# Patient Record
Sex: Male | Born: 1965 | Race: Black or African American | Hispanic: No | Smoking: Current every day smoker
Health system: Southern US, Community
[De-identification: ages and names within clinical notes are randomized; demographics above are authoritative.]

## PROBLEM LIST (undated history)

## (undated) DIAGNOSIS — G8929 Other chronic pain: Secondary | ICD-10-CM

## (undated) DIAGNOSIS — M549 Dorsalgia, unspecified: Secondary | ICD-10-CM

## (undated) DIAGNOSIS — K469 Unspecified abdominal hernia without obstruction or gangrene: Secondary | ICD-10-CM

## (undated) HISTORY — PX: MANDIBLE FRACTURE SURGERY: SHX706

## (undated) HISTORY — PX: NEPHROSTOMY: SHX1014

## (undated) HISTORY — PX: BACK SURGERY: SHX140

## (undated) HISTORY — PX: HERNIA REPAIR: SHX51

---

## 2001-11-29 ENCOUNTER — Emergency Department (HOSPITAL_COMMUNITY): Admission: EM | Admit: 2001-11-29 | Discharge: 2001-11-29 | Payer: Self-pay | Admitting: Emergency Medicine

## 2001-11-29 ENCOUNTER — Encounter: Payer: Self-pay | Admitting: Emergency Medicine

## 2001-12-01 ENCOUNTER — Observation Stay (HOSPITAL_COMMUNITY): Admission: RE | Admit: 2001-12-01 | Discharge: 2001-12-02 | Payer: Self-pay | Admitting: Dentistry

## 2002-10-05 ENCOUNTER — Emergency Department (HOSPITAL_COMMUNITY): Admission: EM | Admit: 2002-10-05 | Discharge: 2002-10-05 | Payer: Self-pay | Admitting: Emergency Medicine

## 2005-04-13 ENCOUNTER — Emergency Department (HOSPITAL_COMMUNITY): Admission: EM | Admit: 2005-04-13 | Discharge: 2005-04-13 | Payer: Self-pay | Admitting: Emergency Medicine

## 2005-04-20 ENCOUNTER — Emergency Department (HOSPITAL_COMMUNITY): Admission: EM | Admit: 2005-04-20 | Discharge: 2005-04-20 | Payer: Self-pay | Admitting: *Deleted

## 2005-11-09 ENCOUNTER — Emergency Department (HOSPITAL_COMMUNITY): Admission: EM | Admit: 2005-11-09 | Discharge: 2005-11-10 | Payer: Self-pay | Admitting: Emergency Medicine

## 2005-11-18 ENCOUNTER — Emergency Department (HOSPITAL_COMMUNITY): Admission: EM | Admit: 2005-11-18 | Discharge: 2005-11-18 | Payer: Self-pay | Admitting: Emergency Medicine

## 2006-02-23 ENCOUNTER — Emergency Department (HOSPITAL_COMMUNITY): Admission: EM | Admit: 2006-02-23 | Discharge: 2006-02-23 | Payer: Self-pay | Admitting: Emergency Medicine

## 2006-03-14 ENCOUNTER — Emergency Department (HOSPITAL_COMMUNITY): Admission: EM | Admit: 2006-03-14 | Discharge: 2006-03-14 | Payer: Self-pay | Admitting: Emergency Medicine

## 2006-04-03 ENCOUNTER — Emergency Department (HOSPITAL_COMMUNITY): Admission: EM | Admit: 2006-04-03 | Discharge: 2006-04-04 | Payer: Self-pay | Admitting: Emergency Medicine

## 2006-11-20 ENCOUNTER — Emergency Department (HOSPITAL_COMMUNITY): Admission: EM | Admit: 2006-11-20 | Discharge: 2006-11-20 | Payer: Self-pay | Admitting: Emergency Medicine

## 2007-01-12 ENCOUNTER — Emergency Department (HOSPITAL_COMMUNITY): Admission: EM | Admit: 2007-01-12 | Discharge: 2007-01-12 | Payer: Self-pay | Admitting: Emergency Medicine

## 2008-02-02 ENCOUNTER — Emergency Department (HOSPITAL_COMMUNITY): Admission: EM | Admit: 2008-02-02 | Discharge: 2008-02-02 | Payer: Self-pay | Admitting: Emergency Medicine

## 2008-06-19 ENCOUNTER — Emergency Department (HOSPITAL_COMMUNITY): Admission: EM | Admit: 2008-06-19 | Discharge: 2008-06-19 | Payer: Self-pay | Admitting: Emergency Medicine

## 2008-07-02 ENCOUNTER — Emergency Department (HOSPITAL_COMMUNITY): Admission: EM | Admit: 2008-07-02 | Discharge: 2008-07-02 | Payer: Self-pay | Admitting: Emergency Medicine

## 2008-09-22 ENCOUNTER — Emergency Department (HOSPITAL_COMMUNITY): Admission: EM | Admit: 2008-09-22 | Discharge: 2008-09-22 | Payer: Self-pay | Admitting: Emergency Medicine

## 2008-11-22 ENCOUNTER — Emergency Department (HOSPITAL_COMMUNITY): Admission: EM | Admit: 2008-11-22 | Discharge: 2008-11-23 | Payer: Self-pay | Admitting: Emergency Medicine

## 2009-01-31 ENCOUNTER — Emergency Department (HOSPITAL_COMMUNITY): Admission: EM | Admit: 2009-01-31 | Discharge: 2009-01-31 | Payer: Self-pay | Admitting: Emergency Medicine

## 2010-10-13 LAB — CBC
HCT: 43.3 % (ref 39.0–52.0)
MCV: 98.9 fL (ref 78.0–100.0)
RDW: 13.2 % (ref 11.5–15.5)
WBC: 5.1 10*3/uL (ref 4.0–10.5)

## 2010-10-13 LAB — DIFFERENTIAL
Basophils Absolute: 0.1 10*3/uL (ref 0.0–0.1)
Basophils Relative: 1 % (ref 0–1)
Eosinophils Relative: 2 % (ref 0–5)
Lymphs Abs: 1.2 10*3/uL (ref 0.7–4.0)
Monocytes Absolute: 0.4 10*3/uL (ref 0.1–1.0)

## 2010-10-13 LAB — CK TOTAL AND CKMB (NOT AT ARMC)
Relative Index: 1.7 (ref 0.0–2.5)
Total CK: 374 U/L — ABNORMAL HIGH (ref 7–232)

## 2010-10-13 LAB — BASIC METABOLIC PANEL
Calcium: 9.8 mg/dL (ref 8.4–10.5)
Creatinine, Ser: 1.11 mg/dL (ref 0.4–1.5)
GFR calc Af Amer: >60 mL/min (ref >60)

## 2010-10-13 LAB — RAPID URINE DRUG SCREEN, HOSP PERFORMED
Amphetamines: NOT DETECTED
Cocaine: POSITIVE — AB
Opiates: NOT DETECTED
Tetrahydrocannabinol: POSITIVE — AB
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
## 2013-04-06 DIAGNOSIS — S02609A Fracture of mandible, unspecified, initial encounter for closed fracture: Secondary | ICD-10-CM | POA: Insufficient documentation

## 2013-07-04 ENCOUNTER — Encounter (HOSPITAL_COMMUNITY): Payer: Self-pay | Admitting: Emergency Medicine

## 2013-07-04 ENCOUNTER — Emergency Department (HOSPITAL_COMMUNITY): Payer: Medicaid Other

## 2013-07-04 ENCOUNTER — Emergency Department (HOSPITAL_COMMUNITY)
Admission: EM | Admit: 2013-07-04 | Discharge: 2013-07-04 | Disposition: A | Discharge status: Home / Self Care | Payer: Medicaid Other | Source: Home / Self Care | Attending: Emergency Medicine | Admitting: Emergency Medicine

## 2013-07-04 DIAGNOSIS — IMO0002 Reserved for concepts with insufficient information to code with codable children: Secondary | ICD-10-CM | POA: Insufficient documentation

## 2013-07-04 DIAGNOSIS — R209 Unspecified disturbances of skin sensation: Secondary | ICD-10-CM | POA: Insufficient documentation

## 2013-07-04 DIAGNOSIS — M549 Dorsalgia, unspecified: Secondary | ICD-10-CM

## 2013-07-04 DIAGNOSIS — F17200 Nicotine dependence, unspecified, uncomplicated: Secondary | ICD-10-CM | POA: Insufficient documentation

## 2013-07-04 MED ORDER — PREDNISONE 20 MG PO TABS: 60.0000 mg | ORAL_TABLET | Freq: Every day | ORAL | Status: AC

## 2013-07-04 MED ORDER — CYCLOBENZAPRINE HCL 10 MG PO TABS
10.0000 mg | ORAL_TABLET | Freq: Once | ORAL | Status: AC
  Administered 2013-07-04: 10 mg via ORAL
  Filled 2013-07-04: qty 1

## 2013-07-04 MED ORDER — DIAZEPAM 10 MG PO TABS: 5.0000 mg | ORAL_TABLET | Freq: Two times a day (BID) | ORAL | Status: AC

## 2013-07-04 MED ORDER — PREDNISONE 50 MG PO TABS
60.0000 mg | ORAL_TABLET | Freq: Once | ORAL | Status: AC
  Administered 2013-07-04: 60 mg via ORAL
  Filled 2013-07-04 (×2): qty 1

## 2013-07-04 MED ORDER — OXYCODONE-ACETAMINOPHEN 5-325 MG PO TABS
2.0000 | ORAL_TABLET | Freq: Once | ORAL | Status: AC
  Administered 2013-07-04: 2 via ORAL
  Filled 2013-07-04: qty 2

## 2013-07-04 MED ORDER — DIAZEPAM 5 MG PO TABS
10.0000 mg | ORAL_TABLET | Freq: Once | ORAL | Status: AC
  Administered 2013-07-04: 10 mg via ORAL
  Filled 2013-07-04: qty 2
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
## 2013-07-04 NOTE — ED Notes (Signed)
Unit secretary notified [facility] of pt's request to file a report.
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
## 2013-07-04 NOTE — ED Notes (Signed)
Pt requests food and drink. Pt informed nothing to eat/drink until seen by edp. Pt then requests wheelchair to go out and smoke. Pt informed this is a smoke free campus and if he goes out to smoke he will have to sign out AMA.
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
## 2013-07-04 NOTE — ED Provider Notes (Signed)
I discussed the patient's MRI, presentation with our neurosurgeon. On exam the patient is standing, though he has an antalgic gait.  He requests discharge. Patient's capacity to make this request.  I discussed the need for close monitoring, neurosurgery followup, medication compliance.  Patient voiced an understanding of this.  Gerhard Munch, MD 07/04/13 [phone]

## 2013-07-06 ENCOUNTER — Emergency Department (HOSPITAL_COMMUNITY): Payer: Medicaid Other

## 2013-07-06 ENCOUNTER — Inpatient Hospital Stay (HOSPITAL_COMMUNITY)
Admission: EM | Admit: 2013-07-06 | Discharge: 2013-07-12 | DRG: 029 | Disposition: A | Discharge status: Rehabilitation Facility | Payer: Medicaid Other | Attending: Neurosurgery | Admitting: Neurosurgery

## 2013-07-06 ENCOUNTER — Encounter (HOSPITAL_COMMUNITY)
Admission: EM | Disposition: A | Discharge status: Rehabilitation Facility | Payer: Self-pay | Source: Home / Self Care | Attending: Neurosurgery

## 2013-07-06 ENCOUNTER — Emergency Department (HOSPITAL_COMMUNITY): Payer: Medicaid Other | Admitting: Certified Registered Nurse Anesthetist

## 2013-07-06 ENCOUNTER — Encounter (HOSPITAL_COMMUNITY): Payer: Medicaid Other | Admitting: Certified Registered Nurse Anesthetist

## 2013-07-06 ENCOUNTER — Inpatient Hospital Stay (HOSPITAL_COMMUNITY): Payer: Medicaid Other

## 2013-07-06 DIAGNOSIS — G9520 Unspecified cord compression: Secondary | ICD-10-CM | POA: Diagnosis present

## 2013-07-06 DIAGNOSIS — S24101A Unspecified injury at T1 level of thoracic spinal cord, initial encounter: Principal | ICD-10-CM | POA: Diagnosis present

## 2013-07-06 DIAGNOSIS — M5124 Other intervertebral disc displacement, thoracic region: Secondary | ICD-10-CM | POA: Diagnosis present

## 2013-07-06 DIAGNOSIS — M4804 Spinal stenosis, thoracic region: Secondary | ICD-10-CM

## 2013-07-06 DIAGNOSIS — X500XXA Overexertion from strenuous movement or load, initial encounter: Secondary | ICD-10-CM | POA: Diagnosis present

## 2013-07-06 DIAGNOSIS — G8220 Paraplegia, unspecified: Secondary | ICD-10-CM

## 2013-07-06 DIAGNOSIS — F17200 Nicotine dependence, unspecified, uncomplicated: Secondary | ICD-10-CM | POA: Diagnosis present

## 2013-07-06 HISTORY — PX: LAMINECTOMY: SHX219

## 2013-07-06 HISTORY — DX: Unspecified abdominal hernia without obstruction or gangrene: K46.9

## 2013-07-06 HISTORY — PX: LUMBAR LAMINECTOMY/DECOMPRESSION MICRODISCECTOMY: SHX5026

## 2013-07-06 LAB — CBC WITH DIFFERENTIAL/PLATELET
Basophils Absolute: 0 10*3/uL (ref 0.0–0.1)
Basophils Relative: 0 % (ref 0–1)
Eosinophils Absolute: 0 10*3/uL (ref 0.0–0.7)
Hemoglobin: 13.1 g/dL (ref 13.0–17.0)
Lymphocytes Relative: 6 % — ABNORMAL LOW (ref 12–46)
Lymphs Abs: 0.6 10*3/uL — ABNORMAL LOW (ref 0.7–4.0)
MCH: 32.8 pg (ref 26.0–34.0)
MCHC: 33.5 g/dL (ref 30.0–36.0)
Monocytes Absolute: 0.1 10*3/uL (ref 0.1–1.0)
Monocytes Relative: 1 % — ABNORMAL LOW (ref 3–12)
Neutro Abs: 9.7 10*3/uL — ABNORMAL HIGH (ref 1.7–7.7)
Neutrophils Relative %: 93 % — ABNORMAL HIGH (ref 43–77)
RDW: 14.2 % (ref 11.5–15.5)
WBC: 10.4 10*3/uL (ref 4.0–10.5)

## 2013-07-06 LAB — POCT I-STAT, CHEM 8
BUN: 13 mg/dL (ref 6–23)
Calcium, Ion: 1.25 mmol/L — ABNORMAL HIGH (ref 1.12–1.23)
Creatinine, Ser: 0.8 mg/dL (ref 0.50–1.35)
Glucose, Bld: 91 mg/dL (ref 70–99)
HCT: 43 % (ref 39.0–52.0)
Hemoglobin: 14.6 g/dL (ref 13.0–17.0)
Potassium: 4 mEq/L (ref 3.7–5.3)
TCO2: 30 mmol/L (ref 0–100)

## 2013-07-06 LAB — URINALYSIS, ROUTINE W REFLEX MICROSCOPIC
Bilirubin Urine: NEGATIVE
Glucose, UA: NEGATIVE mg/dL
Hgb urine dipstick: NEGATIVE
Nitrite: NEGATIVE
Protein, ur: NEGATIVE mg/dL
Specific Gravity, Urine: 1.016 (ref 1.005–1.030)
Urobilinogen, UA: 0.2 mg/dL (ref 0.0–1.0)

## 2013-07-06 LAB — TYPE AND SCREEN: Antibody Screen: NEGATIVE

## 2013-07-06 LAB — PROTIME-INR: INR: 0.9 (ref 0.00–1.49)

## 2013-07-06 LAB — ABO/RH: ABO/RH(D): O POS

## 2013-07-06 SURGERY — LUMBAR LAMINECTOMY/DECOMPRESSION MICRODISCECTOMY 1 LEVEL
Anesthesia: General | Site: Back

## 2013-07-06 MED ORDER — 0.9 % SODIUM CHLORIDE (POUR BTL) OPTIME
TOPICAL | Status: DC | PRN
  Administered 2013-07-06: 1000 mL

## 2013-07-06 MED ORDER — CEFAZOLIN SODIUM 1-5 GM-% IV SOLN
INTRAVENOUS | Status: AC
  Administered 2013-07-06: 1 g via INTRAVENOUS
  Filled 2013-07-06: qty 50

## 2013-07-06 MED ORDER — SUCCINYLCHOLINE CHLORIDE 20 MG/ML IJ SOLN
INTRAMUSCULAR | Status: DC | PRN
  Administered 2013-07-06: 140 mg via INTRAVENOUS

## 2013-07-06 MED ORDER — LIDOCAINE HCL (CARDIAC) 20 MG/ML IV SOLN
INTRAVENOUS | Status: DC | PRN
  Administered 2013-07-06: 100 mg via INTRAVENOUS

## 2013-07-06 MED ORDER — MIDAZOLAM HCL 5 MG/5ML IJ SOLN
INTRAMUSCULAR | Status: DC | PRN
  Administered 2013-07-06: 2 mg via INTRAVENOUS

## 2013-07-06 MED ORDER — ROCURONIUM BROMIDE 100 MG/10ML IV SOLN
INTRAVENOUS | Status: DC | PRN
  Administered 2013-07-06: 50 mg via INTRAVENOUS
  Administered 2013-07-06: 10 mg via INTRAVENOUS

## 2013-07-06 MED ORDER — SODIUM CHLORIDE 0.9 % IR SOLN
Status: DC | PRN
  Administered 2013-07-06: 22:00:00

## 2013-07-06 MED ORDER — ONDANSETRON HCL 4 MG/2ML IJ SOLN
4.0000 mg | Freq: Once | INTRAMUSCULAR | Status: AC
  Administered 2013-07-06: 4 mg via INTRAVENOUS
  Filled 2013-07-06: qty 2

## 2013-07-06 MED ORDER — ARTIFICIAL TEARS OP OINT
TOPICAL_OINTMENT | OPHTHALMIC | Status: DC | PRN
  Administered 2013-07-06: 1 via OPHTHALMIC

## 2013-07-06 MED ORDER — FENTANYL CITRATE 0.05 MG/ML IJ SOLN
INTRAMUSCULAR | Status: DC | PRN
  Administered 2013-07-06 (×2): 50 ug via INTRAVENOUS
  Administered 2013-07-06: 150 ug via INTRAVENOUS

## 2013-07-06 MED ORDER — HYDROMORPHONE HCL PF 1 MG/ML IJ SOLN
1.0000 mg | Freq: Once | INTRAMUSCULAR | Status: AC
  Administered 2013-07-06: 1 mg via INTRAVENOUS
  Filled 2013-07-06: qty 1

## 2013-07-06 MED ORDER — PROPOFOL 10 MG/ML IV BOLUS
INTRAVENOUS | Status: DC | PRN
  Administered 2013-07-06: 200 mg via INTRAVENOUS

## 2013-07-06 MED ORDER — LACTATED RINGERS IV SOLN
INTRAVENOUS | Status: DC | PRN
  Administered 2013-07-06 (×2): via INTRAVENOUS

## 2013-07-06 MED ORDER — BUPIVACAINE HCL (PF) 0.5 % IJ SOLN
INTRAMUSCULAR | Status: DC | PRN
  Administered 2013-07-06: 3 mL

## 2013-07-06 MED ORDER — THROMBIN 20000 UNITS EX KIT
PACK | CUTANEOUS | Status: DC | PRN
  Administered 2013-07-06: 02:00:00 via TOPICAL

## 2013-07-06 MED ORDER — LORAZEPAM 2 MG/ML IJ SOLN
1.0000 mg | Freq: Once | INTRAMUSCULAR | Status: AC
  Administered 2013-07-06: 1 mg via INTRAVENOUS
  Filled 2013-07-06: qty 1

## 2013-07-06 MED ORDER — GELATIN ABSORBABLE MT POWD
OROMUCOSAL | Status: DC | PRN
  Administered 2013-07-06: 23:00:00 via TOPICAL

## 2013-07-06 MED ORDER — LIDOCAINE-EPINEPHRINE 1 %-1:100000 IJ SOLN
INTRAMUSCULAR | Status: DC | PRN
  Administered 2013-07-06: 3 mL via INTRADERMAL

## 2013-07-06 MED ORDER — NICOTINE 21 MG/24HR TD PT24
21.0000 mg | MEDICATED_PATCH | Freq: Once | TRANSDERMAL | Status: AC
  Administered 2013-07-06: 21 mg via TRANSDERMAL
  Filled 2013-07-06: qty 1

## 2013-07-06 SURGICAL SUPPLY — 63 items
ADH SKN CLS APL DERMABOND .7 (GAUZE/BANDAGES/DRESSINGS) ×1
APL SKNCLS STERI-STRIP NONHPOA (GAUZE/BANDAGES/DRESSINGS)
BAG DECANTER FOR FLEXI CONT (MISCELLANEOUS) ×2 IMPLANT
BENZOIN TINCTURE PRP APPL 2/3 (GAUZE/BANDAGES/DRESSINGS) IMPLANT
BLADE SURG 11 STRL SS (BLADE) ×2 IMPLANT
BLADE SURG ROTATE 9660 (MISCELLANEOUS) IMPLANT
BUR MATCHSTICK NEURO 3.0 LAGG (BURR) ×2 IMPLANT
BUR ROUND FLUTED 5 RND (BURR) ×2 IMPLANT
CANISTER SUCT 3000ML (MISCELLANEOUS) ×2 IMPLANT
CONT SPEC 4OZ CLIKSEAL STRL BL (MISCELLANEOUS) ×2 IMPLANT
DECANTER SPIKE VIAL GLASS SM (MISCELLANEOUS) ×2 IMPLANT
DERMABOND ADVANCED (GAUZE/BANDAGES/DRESSINGS) ×1
DERMABOND ADVANCED .7 DNX12 (GAUZE/BANDAGES/DRESSINGS) ×1 IMPLANT
DRAPE LAPAROTOMY 100X72X124 (DRAPES) ×2 IMPLANT
DRAPE MICROSCOPE LEICA (MISCELLANEOUS) ×2 IMPLANT
DRAPE POUCH INSTRU U-SHP 10X18 (DRAPES) ×2 IMPLANT
DRAPE SURG 17X23 STRL (DRAPES) ×2 IMPLANT
DRSG OPSITE POSTOP 4X6 (GAUZE/BANDAGES/DRESSINGS) ×2 IMPLANT
DURAPREP 26ML APPLICATOR (WOUND CARE) ×2 IMPLANT
ELECT REM PT RETURN 9FT ADLT (ELECTROSURGICAL) ×2
ELECTRODE REM PT RTRN 9FT ADLT (ELECTROSURGICAL) ×1 IMPLANT
GAUZE SPONGE 4X4 16PLY XRAY LF (GAUZE/BANDAGES/DRESSINGS) IMPLANT
GLOVE BIO SURGEON STRL SZ 6.5 (GLOVE) ×2 IMPLANT
GLOVE BIO SURGEON STRL SZ7 (GLOVE) ×1 IMPLANT
GLOVE BIOGEL PI IND STRL 7.5 (GLOVE) ×1 IMPLANT
GLOVE BIOGEL PI INDICATOR 7.5 (GLOVE) ×1
GLOVE ECLIPSE 7.0 STRL STRAW (GLOVE) ×2 IMPLANT
GLOVE EXAM NITRILE LRG STRL (GLOVE) IMPLANT
GLOVE EXAM NITRILE MD LF STRL (GLOVE) IMPLANT
GLOVE EXAM NITRILE XL STR (GLOVE) IMPLANT
GLOVE EXAM NITRILE XS STR PU (GLOVE) IMPLANT
GOWN BRE IMP SLV AUR LG STRL (GOWN DISPOSABLE) ×4 IMPLANT
GOWN BRE IMP SLV AUR XL STRL (GOWN DISPOSABLE) IMPLANT
GOWN STRL REIN 2XL LVL4 (GOWN DISPOSABLE) IMPLANT
HEMOSTAT POWDER KIT SURGIFOAM (HEMOSTASIS) ×2 IMPLANT
KIT BASIN OR (CUSTOM PROCEDURE TRAY) ×2 IMPLANT
KIT ROOM TURNOVER OR (KITS) ×2 IMPLANT
NDL HYPO 18GX1.5 BLUNT FILL (NEEDLE) IMPLANT
NDL HYPO 25X1 1.5 SAFETY (NEEDLE) ×1 IMPLANT
NDL SPNL 18GX3.5 QUINCKE PK (NEEDLE) IMPLANT
NEEDLE HYPO 18GX1.5 BLUNT FILL (NEEDLE) IMPLANT
NEEDLE HYPO 25X1 1.5 SAFETY (NEEDLE) ×2 IMPLANT
NEEDLE SPNL 18GX3.5 QUINCKE PK (NEEDLE) IMPLANT
NEEDLE SPNL 22GX3.5 QUINCKE BK (NEEDLE) ×2 IMPLANT
NS IRRIG 1000ML POUR BTL (IV SOLUTION) ×2 IMPLANT
PACK LAMINECTOMY NEURO (CUSTOM PROCEDURE TRAY) ×2 IMPLANT
PAD ARMBOARD 7.5X6 YLW CONV (MISCELLANEOUS) ×6 IMPLANT
RUBBERBAND STERILE (MISCELLANEOUS) ×4 IMPLANT
SPONGE GAUZE 4X4 12PLY (GAUZE/BANDAGES/DRESSINGS) IMPLANT
SPONGE LAP 4X18 X RAY DECT (DISPOSABLE) IMPLANT
SPONGE SURGIFOAM ABS GEL SZ50 (HEMOSTASIS) ×2 IMPLANT
STRIP CLOSURE SKIN 1/2X4 (GAUZE/BANDAGES/DRESSINGS) IMPLANT
SUT VIC AB 0 CT1 18XCR BRD8 (SUTURE) ×1 IMPLANT
SUT VIC AB 0 CT1 8-18 (SUTURE) ×2
SUT VIC AB 2-0 CT1 18 (SUTURE) IMPLANT
SUT VIC AB 3-0 FS2 27 (SUTURE) IMPLANT
SUT VIC AB 3-0 SH 8-18 (SUTURE) ×2 IMPLANT
SUT VICRYL 3-0 RB1 18 ABS (SUTURE) IMPLANT
SYR 20ML ECCENTRIC (SYRINGE) ×2 IMPLANT
SYR 3ML LL SCALE MARK (SYRINGE) IMPLANT
TOWEL OR 17X24 6PK STRL BLUE (TOWEL DISPOSABLE) ×2 IMPLANT
TOWEL OR 17X26 10 PK STRL BLUE (TOWEL DISPOSABLE) ×2 IMPLANT
WATER STERILE IRR 1000ML POUR (IV SOLUTION) ×2 IMPLANT

## 2013-07-06 NOTE — ED Notes (Signed)
Per MRI request pt is not staying still & it is difficult to get an accurate image, [place], PA made aware, RN to MRI to admin ativan 1 mg & dilaudid 1 mg, MRI unable to complete scan at this time, PA made aware, pt returned to room for MRI to be completed in the future

## 2013-07-06 NOTE — ED Notes (Signed)
Pt is asking for something to eat. Pt informed that he cannot eat until the results of his MRI come back.

## 2013-07-06 NOTE — ED Notes (Signed)
Neurosurgery, MD at bedside.
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
## 2013-07-06 NOTE — ED Notes (Signed)
Pt returned to department from MRI
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
## 2013-07-06 NOTE — ED Notes (Signed)
Pt in from home via [facility], per EMS report pt fell down stairs x 7 days ago, denies LOC & head injury at that time, pt reports increasing difficulty walking since that time, denies bowel & bladder incontinence, pt A&O x4, follows commands, speaks in complete sentences, pt non ambulatory upon EMS arrival,  pt seen at [facility] for similar symptoms last Friday

## 2013-07-06 NOTE — ED Notes (Signed)
Patient transported to MRI 

## 2013-07-06 NOTE — ED Notes (Signed)
OR is ready for the pt to go to the holding room. This RN informed them that the pt needs blood drawn and a CT scan before the pt can be taken to OR holding. This RN will call when the pt is on the way upstairs.

## 2013-07-06 NOTE — H&P (Signed)
CC:  Chief Complaint  Patient presents with  . Back Pain    HPI: Eddie Rocha is a 47 y.o. male who presents to the emergency department today complaining of back pain and weakness in the legs with inability to walk. He said the pain and weakness initially started on Friday approximately 5 days ago after he was wrestling with some family. Since that time, he states he has sharp pain in the middle of his back in the thoracolumbar region. In addition, he describes "numbness" which extends down on both sides below the level of his navel. The patient denies any changes in bowel or bladder habits, in particular denying symptoms of urinary retention.  The patient was apparently seen in the emergency department approximately 2 days ago with similar complaints of back pain and the weakness, however per emergency department notes, the patient was ambulatory at that time.  PMH: No past medical history on file.  PSH: Past Surgical History  Procedure Laterality Date  . Mandible fracture surgery      SH: History  Substance Use Topics  . Smoking status: Current Every Day Smoker    Types: Cigarettes  . Smokeless tobacco: Not on file  . Alcohol Use: Yes    MEDS: Prior to Admission medications   Medication Sig Start Date End Date Taking? Authorizing Provider  diazepam (VALIUM) 10 MG tablet Take 0.5 tablets (5 mg total) by mouth 2 (two) times daily. 07/04/13 07/09/13 Yes Gerhard Munch, MD  predniSONE (DELTASONE) 20 MG tablet Take 3 tablets (60 mg total) by mouth daily with breakfast. 07/05/13 07/09/13 Yes Gerhard Munch, MD    ALLERGY: No Known Allergies  NEUROLOGIC EXAM: Awake, alert, oriented Memory and concentration grossly intact Speech fluent, appropriate CN grossly intact Motor exam: Upper Extremities Deltoid Bicep Tricep Grip  Right 5/5 5/5 5/5 5/5  Left 5/5 5/5 5/5 5/5   Lower Extremity IP Quad PF DF EHL  Right 3/5 4-/5 0/5 0/5 1/5  Left 2/5 4-/5 0/5 0/5 0/5    Decreased sensation to LT below ~T10 level bilaterally (+) babinski on right, downgoing on left  IMGAING: MRI Tspine reviewed demonstrating compressive epidural lesion at T9-10 likely EDH with underlying thoracic disc herniation. There may be slight T2 signal change in the thoracic cord at this level.  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 PLAN: - CT T-spine to evaluate for bony fracture - Surgery for laminectomy and evacuation of EDH/decompression of spinal cord  The MRI findings were discussed with the patient. I explained to him the need for surgery for decompression of the spinal cord to give him the best chance for functional recovery of strength in the lower extremities. The risks of the surgery were explained to the patient including bleeding, infection, and worsening spinal cord injury. The patient understood our discussion and provided verbal and written consent for surgery which was witnessed by the emergency department nurse in the room. All his questions were answered.

## 2013-07-06 NOTE — Anesthesia Procedure Notes (Signed)
Procedure Name: Intubation Date/Time: 07/06/2013 9:51 PM Performed by: Angelica Pou Pre-anesthesia Checklist: Patient identified, Patient being monitored, Emergency Drugs available, Timeout performed and Suction available Patient Re-evaluated:Patient Re-evaluated prior to inductionOxygen Delivery Method: Circle system utilized Preoxygenation: Pre-oxygenation with 100% oxygen Intubation Type: IV induction, Rapid sequence and Cricoid Pressure applied Laryngoscope Size: Mac, 3 and 4 (Glidescope #4) Grade View: Grade IV Tube size: 7.5 mm Number of attempts: 4 Airway Equipment and Method: Stylet,  Rigid stylet and Video-laryngoscopy Placement Confirmation: ETT inserted through vocal cords under direct vision,  breath sounds checked- equal and bilateral and positive ETCO2 Secured at: 23 cm Tube secured with: Tape Dental Injury: Bloody posterior oropharynx  Difficulty Due To: Difficult Airway- due to limited oral opening and Difficult Airway- due to anterior larynx Comments: Pt with hx broken jaw, lower hardware remains in place. Smooth IV induction by Dr Michelle Piper, DL x1 with Mac 3 by CRNA with Gr IV view. DL x1 with Mac 3 by MDA with Gr IV view. VL x2 with GS #4 by MDA with Gr IV view.  ETT passed by MDA, resulting in +etCO2, BS=B.  Pt with VSS throughout, SpO2 100%.

## 2013-07-06 NOTE — ED Notes (Signed)
Pt taken to CT by this RN, and then transported to the neurosurgery OR holding area. Report given to the nurse anesthetist at that time.

## 2013-07-06 NOTE — ED Provider Notes (Signed)
Medical screening examination/treatment/procedure(s) were conducted as a shared visit with non-physician practitioner(s) and myself.  I personally evaluated the patient during the encounter.  EKG Interpretation   None        Doug Sou, MD 07/06/13 2025

## 2013-07-06 NOTE — Anesthesia Preprocedure Evaluation (Addendum)
Anesthesia Evaluation  Patient identified by MRN, date of birth, ID band Patient awake    Reviewed: Allergy & Precautions, H&P , NPO status , Patient's Chart, lab work & pertinent test results  History of Anesthesia Complications Negative for: history of anesthetic complications  Airway Mallampati: I TM Distance: >3 FB Neck ROM: Full  Mouth opening: Limited Mouth Opening  Dental  (+) Dental Advisory Given, Chipped and Poor Dentition   Pulmonary Current Smoker,  breath sounds clear to auscultation  Pulmonary exam normal       Cardiovascular Exercise Tolerance: Good Rhythm:Regular Rate:Normal     Neuro/Psych    GI/Hepatic   Endo/Other    Renal/GU      Musculoskeletal   Abdominal   Peds  Hematology   Anesthesia Other Findings   Reproductive/Obstetrics                          Anesthesia Physical Anesthesia Plan  ASA: II  Anesthesia Plan: General   Post-op Pain Management:    Induction: Intravenous, Rapid sequence and Cricoid pressure planned  Airway Management Planned: Oral ETT  Additional Equipment:   Intra-op Plan:   Post-operative Plan: Extubation in OR  Informed Consent: I have reviewed the patients History and Physical, chart, labs and discussed the procedure including the risks, benefits and alternatives for the proposed anesthesia with the patient or authorized representative who has indicated his/her understanding and acceptance.     Plan Discussed with: CRNA and Surgeon  Anesthesia Plan Comments:         Anesthesia Quick Evaluation

## 2013-07-07 ENCOUNTER — Inpatient Hospital Stay (HOSPITAL_COMMUNITY): Payer: Medicaid Other

## 2013-07-07 ENCOUNTER — Encounter (HOSPITAL_COMMUNITY): Payer: Self-pay | Admitting: General Practice

## 2013-07-07 DIAGNOSIS — G9520 Unspecified cord compression: Secondary | ICD-10-CM | POA: Diagnosis present

## 2013-07-07 MED ORDER — SENNA 8.6 MG PO TABS
1.0000 | ORAL_TABLET | Freq: Two times a day (BID) | ORAL | Status: DC
  Administered 2013-07-07 – 2013-07-11 (×9): 8.6 mg via ORAL
  Filled 2013-07-07 (×13): qty 1

## 2013-07-07 MED ORDER — SODIUM CHLORIDE 0.9 % IV SOLN: 250.0000 mL | INTRAVENOUS | Status: DC

## 2013-07-07 MED ORDER — HYDROMORPHONE HCL PF 1 MG/ML IJ SOLN: 0.2500 mg | INTRAMUSCULAR | Status: DC | PRN

## 2013-07-07 MED ORDER — OXYCODONE-ACETAMINOPHEN 5-325 MG PO TABS
1.0000 | ORAL_TABLET | ORAL | Status: DC | PRN
  Administered 2013-07-07 – 2013-07-12 (×16): 2 via ORAL
  Filled 2013-07-07 (×17): qty 2

## 2013-07-07 MED ORDER — SODIUM CHLORIDE 0.9 % IV SOLN: INTRAVENOUS | Status: DC

## 2013-07-07 MED ORDER — NICOTINE 21 MG/24HR TD PT24
21.0000 mg | MEDICATED_PATCH | Freq: Every day | TRANSDERMAL | Status: DC
  Administered 2013-07-07 – 2013-07-12 (×5): 21 mg via TRANSDERMAL
  Filled 2013-07-07 (×7): qty 1

## 2013-07-07 MED ORDER — DOCUSATE SODIUM 100 MG PO CAPS
100.0000 mg | ORAL_CAPSULE | Freq: Two times a day (BID) | ORAL | Status: DC
  Administered 2013-07-07 – 2013-07-11 (×10): 100 mg via ORAL
  Filled 2013-07-07 (×12): qty 1

## 2013-07-07 MED ORDER — SODIUM CHLORIDE 0.9 % IJ SOLN
3.0000 mL | INTRAMUSCULAR | Status: DC | PRN
  Administered 2013-07-11: 3 mL via INTRAVENOUS

## 2013-07-07 MED ORDER — OXYCODONE HCL 5 MG PO TABS: 5.0000 mg | ORAL_TABLET | Freq: Once | ORAL | Status: DC | PRN

## 2013-07-07 MED ORDER — ONDANSETRON HCL 4 MG/2ML IJ SOLN: 4.0000 mg | INTRAMUSCULAR | Status: DC | PRN

## 2013-07-07 MED ORDER — PHENOL 1.4 % MT LIQD: 1.0000 | OROMUCOSAL | Status: DC | PRN

## 2013-07-07 MED ORDER — ONDANSETRON HCL 4 MG/2ML IJ SOLN: 4.0000 mg | Freq: Once | INTRAMUSCULAR | Status: DC | PRN

## 2013-07-07 MED ORDER — ACETAMINOPHEN 325 MG PO TABS: 650.0000 mg | ORAL_TABLET | ORAL | Status: DC | PRN

## 2013-07-07 MED ORDER — DIAZEPAM 5 MG PO TABS
5.0000 mg | ORAL_TABLET | Freq: Two times a day (BID) | ORAL | Status: DC
Start: 2013-07-07 — End: 2013-07-12
  Administered 2013-07-07 – 2013-07-12 (×12): 5 mg via ORAL
  Filled 2013-07-07 (×12): qty 1

## 2013-07-07 MED ORDER — MENTHOL 3 MG MT LOZG: 1.0000 | LOZENGE | OROMUCOSAL | Status: DC | PRN

## 2013-07-07 MED ORDER — SODIUM CHLORIDE 0.9 % IJ SOLN
3.0000 mL | Freq: Two times a day (BID) | INTRAMUSCULAR | Status: DC
  Administered 2013-07-07 – 2013-07-12 (×11): 3 mL via INTRAVENOUS

## 2013-07-07 MED ORDER — GLYCOPYRROLATE 0.2 MG/ML IJ SOLN
INTRAMUSCULAR | Status: DC | PRN
  Administered 2013-07-07: .6 mg via INTRAVENOUS

## 2013-07-07 MED ORDER — HEPARIN SODIUM (PORCINE) 5000 UNIT/ML IJ SOLN
5000.0000 [IU] | Freq: Three times a day (TID) | INTRAMUSCULAR | Status: DC
  Administered 2013-07-08 – 2013-07-11 (×7): 5000 [IU] via SUBCUTANEOUS
  Filled 2013-07-07 (×16): qty 1

## 2013-07-07 MED ORDER — MEPERIDINE HCL 25 MG/ML IJ SOLN: 6.2500 mg | INTRAMUSCULAR | Status: DC | PRN

## 2013-07-07 MED ORDER — CEFAZOLIN SODIUM 1-5 GM-% IV SOLN
1.0000 g | Freq: Three times a day (TID) | INTRAVENOUS | Status: AC
  Administered 2013-07-07 (×2): 1 g via INTRAVENOUS
  Filled 2013-07-07 (×3): qty 50

## 2013-07-07 MED ORDER — OXYCODONE HCL 5 MG/5ML PO SOLN: 5.0000 mg | Freq: Once | ORAL | Status: DC | PRN

## 2013-07-07 MED ORDER — BIOTENE DRY MOUTH MT LIQD
15.0000 mL | Freq: Two times a day (BID) | OROMUCOSAL | Status: DC
  Administered 2013-07-07 – 2013-07-12 (×9): 15 mL via OROMUCOSAL

## 2013-07-07 MED ORDER — ACETAMINOPHEN 650 MG RE SUPP: 650.0000 mg | RECTAL | Status: DC | PRN

## 2013-07-07 MED ORDER — NEOSTIGMINE METHYLSULFATE 1 MG/ML IJ SOLN
INTRAMUSCULAR | Status: DC | PRN
  Administered 2013-07-07: 4 mg via INTRAVENOUS

## 2013-07-07 MED ORDER — MORPHINE SULFATE 2 MG/ML IJ SOLN
1.0000 mg | INTRAMUSCULAR | Status: DC | PRN
  Administered 2013-07-07 – 2013-07-08 (×7): 2 mg via INTRAVENOUS
  Administered 2013-07-08: 4 mg via INTRAVENOUS
  Administered 2013-07-08 (×3): 2 mg via INTRAVENOUS
  Administered 2013-07-09 (×5): 4 mg via INTRAVENOUS
  Administered 2013-07-10 (×3): 2 mg via INTRAVENOUS
  Administered 2013-07-10 (×2): 4 mg via INTRAVENOUS
  Administered 2013-07-11 – 2013-07-12 (×6): 2 mg via INTRAVENOUS
  Filled 2013-07-07 (×3): qty 2
  Filled 2013-07-07 (×2): qty 1
  Filled 2013-07-07: qty 2
  Filled 2013-07-07 (×2): qty 1
  Filled 2013-07-07: qty 2
  Filled 2013-07-07 (×4): qty 1
  Filled 2013-07-07: qty 2
  Filled 2013-07-07 (×2): qty 1
  Filled 2013-07-07: qty 2
  Filled 2013-07-07 (×9): qty 1
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
## 2013-07-07 NOTE — Transfer of Care (Addendum)
Immediate Anesthesia Transfer of Care Note  Patient: Eddie Rocha  Procedure(s) Performed: Procedure(s): LUMBAR LAMINECTOMY, DECOMPRESSION  T9 to T10 (N/A)  Patient Location: PACU- neuro  Anesthesia Type:General  Level of Consciousness: awake  Airway & Oxygen Therapy: Patient Spontanous Breathing and Patient connected to nasal cannula oxygen  Post-op Assessment: Report given to PACU RNDessa Phi RN  Post vital signs: Reviewed and stable  Complications: No apparent anesthesia complications
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
## 2013-07-07 NOTE — Anesthesia Postprocedure Evaluation (Signed)
Anesthesia Post Note  Patient: Eddie Rocha  Procedure(s) Performed: Procedure(s) (LRB): LUMBAR LAMINECTOMY, DECOMPRESSION  T9 to T10 (N/A)  Anesthesia type: general  Patient location: PACU  Post pain: Pain level controlled  Post assessment: Patient's Cardiovascular Status Stable  Last Vitals:  Filed Vitals:   07/07/13 0534  BP: 116/66  Pulse: 85  Temp: 36.8 C  Resp: 16    Post vital signs: Reviewed and stable  Level of consciousness: sedated  Complications: No apparent anesthesia complications

## 2013-07-07 NOTE — Progress Notes (Signed)
Patient ID: Eddie Rocha, male   DOB: 15-Aug-1965, 48 y.o.   MRN: [id number] Subjective:  The patient is alert and pleasant.  Objective: Vital signs in last 24 hours: Temp:  [97.1 F (36.2 C)-98.6 F (37 C)] 98.2 F (36.8 C) (01/01 0534) Pulse Rate:  [64-85] 85 (01/01 0534) Resp:  [9-26] 16 (01/01 0534) BP: (116-146)/(61-82) 116/66 mmHg (01/01 0534) SpO2:  [97 %-100 %] 100 % (01/01 0534)  Intake/Output from previous day: 12/31 0701 - 01/01 0700 In: 2357.5 [P.O.:320; I.V.:1987.5; IV Piggyback:50] Out: 305 [Urine:275; Blood:30] Intake/Output this shift: Total I/O In: -  Out: 250 [Urine:250]  Physical exam patient is alert and oriented. His lower extremity strength has improved postoperatively.  Lab Results:  Recent Labs  07/06/13 1515 07/06/13 1535  WBC 10.4  --   HGB 13.1 14.6  HCT 39.1 43.0  PLT 243  --    BMET  Recent Labs  07/06/13 1535  NA 143  K 4.0  CL 104  GLUCOSE 91  BUN 13  CREATININE 0.80    Studies/Results: Dg Thoracic Spine 2 View  07/06/2013   CLINICAL DATA:  T9-10 laminectomy.  EXAM: DG C-ARM 1-60 MIN; THORACIC SPINE - 2 VIEW  COMPARISON:  Chest CT of earlier in the day.  FINDINGS: AP and lateral intraoperative views. These demonstrate surgical devices which localize the T10 vertebral body (presuming 12 rib-bearing vertebral bodies.  IMPRESSION: Intraoperative localization of T10.   Electronically Signed   By: Abigail Miyamoto M.D.   On: 07/06/2013 23:27   Dg Thoracolumabar Spine  07/07/2013   CLINICAL DATA:  T9-10 laminectomy  EXAM: THORACOLUMBAR SPINE - 2 VIEW  COMPARISON:  Intraoperative fluoroscopy 07/06/2013  FINDINGS: Cross-table lateral views of the lower thoracic and lumbar spine are obtained. A localizing the loose placed posterior to T12, assuming 5 lumbar type vertebrae. Surgical spreaders are located posterior to T8 and T9 with a surgical localizer posterior to the T9 vertebra.  IMPRESSION: Localization markers as described.    Electronically Signed   By: Lucienne Capers M.D.   On: 07/07/2013 00:15   Ct Thoracic Spine Wo Contrast  07/06/2013   CLINICAL DATA:  Back pain, paraplegic.  EXAM: CT THORACIC SPINE WITHOUT CONTRAST  TECHNIQUE: Multidetector CT imaging of the thoracic spine was performed without intravenous contrast administration. Multiplanar CT image reconstructions were also generated.  COMPARISON:  MRI thoracic spine earlier today.  FINDINGS: Vertebral body alignment and heights are normal. There is mild spondylosis throughout the thoracic spine. There is no acute compression fracture or subluxation.  There is mild bilateral neural foramenal narrowing from the T9-10 level to the T11-12 level. Remainder of the exam is unremarkable.  IMPRESSION: No acute findings. Mild spondylosis throughout the thoracic spine with mild bilateral neural foraminal narrowing from the T9-10 level to the T11-12 level. Recommend correlation with report of patient's MRI of the thoracic spine from earlier today.   Electronically Signed   By: Marin Olp M.D.   On: 07/06/2013 21:45   Mr Thoracic Spine Wo Contrast  07/06/2013   CLINICAL DATA:  High for patient unable to move is legs. Paraplegia. Traumatic injury to the back 1 week ago.  EXAM: MRI THORACIC SPINE WITHOUT CONTRAST  TECHNIQUE: Multiplanar, multisequence MR imaging was performed. No intravenous contrast was administered.  COMPARISON:  04/20/2005.  FINDINGS: Subtle posterior endplate edema at the F5-73 level noted extending into the left T10 pedicle. Otherwise no significant abnormal vertebral edema in the thoracic spine.  No thoracic malalignment. Mild  type 2 degenerative endplate findings are noted at multiple levels in the lower thoracic spine. Slight physiologic anterior wedging at T12 noted.  Additional findings at individual levels are as follows:  T1-2: Mild bilateral foraminal stenosis due to uncinate spurring and facet arthropathy.  T2-3: Moderate bilateral foraminal stenosis  due to intervertebral spurring, facet arthropathy, and mild disc bulge.  T3-4: Minimal flattening of the right anterior cord margin due to a small right lateral recess disc protrusion, borderline right eccentric central stenosis.  T4-5: Mild right eccentric central stenosis and mild right foraminal stenosis due to right lateral recess and foraminal disc protrusion.  T5-6: Mild right foraminal stenosis and mild right eccentric central stenosis due to right lateral recess and foraminal disc protrusion.  T6-7:  No impingement.  Mild disc bulge.  T7-8: No impingement. Mild disc bulge with small left lateral recess disc protrusion.  T8-9:  Borderline central stenosis due to disc bulge.  T9-10: Marked central stenosis associated with right and left lateral recess disc protrusions, disc bulge, and an extradural 1.5 cm vertical by 0.4 cm anterior anterior-posterior by 1.0 cm transverse posterior process with intermediate T1 and intermediate T2 signal characteristics but relatively sharply defined margins is shown on image 8 of series 5. There is low grade cord edema at the T9 and T10 levels associated with this severe central stenosis, and there is prominent left and moderate right foraminal stenosis at this level. The thecal sac is narrowed down to about 0.3 cm^2 at this level.  T10-11: Moderate left and mild right foraminal stenosis and borderline central stenosis due to bilateral foraminal disc protrusions and disc bulge.  T11-12: Mild left and borderline right foraminal stenosis along with mild central stenosis due to diffuse disc bulge, facet arthropathy.  T12-L1: Mild abutment of the right T12 nerve in the lateral extraforaminal space due by the adjacent disc protrusion. Mild disc bulge.  IMPRESSION: 1. Marked central stenosis along with prominent left and moderate right foraminal stenosis at T9-10 due to disc bulge, bilateral lateral recess disc protrusions, and an abnormal posterior space-occupying epidural process  which could reflect a large disc extrusion, small well-defined epidural hematoma, or a synovial cyst. There is some low grade edema posteriorly along the vertebral endplates, extending into the left T10 pedicle at this level, possibly a reflection of low grade bony injury as well. There is low-grade cord edema at T9 and T10 likely related to the prominent central impingement. Neurosurgical consultation recommended. 2. 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 Critical Value/emergent results were called by telephone at the time of interpretation on 07/06/2013 at 7:49 PM to Dr. Ignacia Felling , who verbally acknowledged these results.   Electronically Signed   By: Sherryl Barters M.D.   On: 07/06/2013 19:51   Dg C-arm 1-60 Min  07/06/2013   CLINICAL DATA:  T9-10 laminectomy.  EXAM: DG C-ARM 1-60 MIN; THORACIC SPINE - 2 VIEW  COMPARISON:  Chest CT of earlier in the day.  FINDINGS: AP and lateral intraoperative views. These demonstrate surgical devices which localize the T10 vertebral body (presuming 12 rib-bearing vertebral bodies.  IMPRESSION: Intraoperative localization of T10.   Electronically Signed   By: Abigail Miyamoto M.D.   On: 07/06/2013 23:27    Assessment/Plan: Postop day 1: We will mobilize the patient with PT/OT  LOS: 1 day     Dennard Vezina D 07/07/2013, 10:11  AM

## 2013-07-07 NOTE — Progress Notes (Signed)
OT NOTE- GCODE ENTRY    07/07/13 1306  OT G-codes **NOT FOR INPATIENT CLASS**  Functional Assessment Tool Used clinical judgement  Functional Limitation Self care  Self Care Current Status (H8299) CL  Self Care Goal Status (B7169) Ardyth Gal   OTR/L Pager: [phone] Office: [phone] .

## 2013-07-07 NOTE — Op Note (Signed)
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 T9-T10 laminectomy for decompression of spinal cord  SURGEON: Dr. Consuella Lose, MD  ASSISTANT: None  ANESTHESIA: General Endotracheal  EBL: 200cc  SPECIMENS: None  DRAINS: None  COMPLICATIONS: None immediate  CONDITION: Stable to PCAU  HISTORY: Eddie Rocha is a 48 y.o. male who presented to the emergency department with back pain and leg weakness with inability to walk. MRI of the thoracic spine was done which demonstrated compression of the thoracic spinal cord at T9-T10 do to a combination of disc herniation as was an epidural mass compressing the cord dorsally. Because of his incomplete spinal cord injury and radiographic findings, emergent surgical decompression was indicated. The risks and benefits of the surgery were explained to the patient who provided verbal and written consent.  PROCEDURE IN DETAIL: After informed consent was obtained and witnessed, the patient was brought to the operating room. After induction of general anesthesia, the patient was positioned on the operative table in the prone position with all pressure points meticulously padded. The skin of the back was then prepped and draped in the usual sterile fashion.  Under fluoroscopy, the correct levels were identified and marked out on the skin, and after timeout was conducted, the skin was infiltrated with local anesthetic. Skin incision was then made sharply and Bovie electrocautery was used to dissect the subcutaneous tissue until the fascia was identified. The fascia was then incised using Bovie electrocautery and the lamina at the T9 and T10 levels was identified and dissection was carried out in the subperiosteal plane. Self-retaining retractor was then placed, and intraoperative x-ray was taken to confirm we were at the correct levels.  Using a high-speed drill, laminectomies were completed at T9 and  T10. The ligamentum flavum was then identified and removed and the lateral edge of the thecal sac was identified bilaterally. Using a combination of the high-speed drill and rongeurs, good lateral decompression was completed. The decompression was confirmed using a small ball tip dissector. Of note, I did not identify a significant hematoma compressing the thoracic spinal cord dorsally. Intraoperative x-ray was again used to confirm laminectomies were completed at the T9 and T10 levels. Good decompression of the spinal cord was confirmed with a dissector with laminectomy completed to the medial edge of the bilateral pedicles.,   Having completed the decompression, hemostasis was then secured using a combination of morcellized Gelfoam and thrombin and bipolar electrocautery. The wound is irrigated with copious amounts of antibiotic saline irrigation.Self-retaining retractor was then removed, and the wound is closed in layers using a combination of interrupted 0 Vicryl and 3-0 Vicryl stitches. The skin was closed using standard skin glue.  At the end of the case all sponge, needle, and instrument counts were correct. The patient was then transferred to the stretcher and taken to the postanesthesia care unit in stable hemodynamic condition.

## 2013-07-08 NOTE — Progress Notes (Signed)
No issues overnight. Pt reports soreness in his back, no other c/o. Says his legs are a little stronger, able to sit in chair with assistance today.  EXAM:  BP 107/65  Pulse 81  Temp(Src) 98.2 F (36.8 C) (Oral)  Resp 18  Ht 5\' 8"  (1.727 m)  Wt 63.05 kg (139 lb)  BMI 21.14 kg/m2  SpO2 99%  Awake, alert, oriented  Speech fluent, appropriate  CN grossly intact  5/5 BUE 4-/5 bilateral IP 4-/5 bilateral Q 4/5 bilateral PR 4/5 right DF 2/5 left DF Wound c/d/i  IMPRESSION:  48 y.o. male POD# 2 s/p T9-10 laminectomy, improving strength in BLE  PLAN: - Consult PMR for IPR admission - Cont PT/OT

## 2013-07-08 NOTE — Progress Notes (Signed)
UR complete.  Kewana Sanon RN, MSN 
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
## 2013-07-08 NOTE — Progress Notes (Signed)
Rehab Admissions Coordinator Note:  Patient was screened by Retta Diones for appropriateness for an Inpatient Acute Rehab Consult.  At this time, we are recommending Inpatient Rehab consult.  Retta Diones 07/08/2013, 3:40 PM  I can be reached at [phone].

## 2013-07-09 MED ORDER — MORPHINE SULFATE 2 MG/ML IJ SOLN
INTRAMUSCULAR | Status: AC
  Filled 2013-07-09: qty 2

## 2013-07-09 NOTE — Progress Notes (Signed)
No new issues or problems overnight. Patient's pain a little better controlled. He is moving his legs better he is happy about this.  Afebrile. Vitals are stable. Awake and alert. Motor exam 4 minus over 5 in both lower extremities. Wound clean and dry. Chest and abdomen benign.  Progressing well following thoracic decompressive surgery. Continue efforts at mobilization.

## 2013-07-10 NOTE — Progress Notes (Addendum)
Patient ID: [name], male   DOB: Apr 29, 1966, 49 y.o.   MRN: [id number] Patient's feeling better his leg is getting stronger his legs still has numbness in his legs.  Strength appears to be 4-4+ out of 5 wound appears to be clean and dry  Progressive mobilization with physical outpatient therapy awaiting rehabilitation placement

## 2013-07-11 ENCOUNTER — Encounter (HOSPITAL_COMMUNITY): Payer: Self-pay | Admitting: Physical Medicine and Rehabilitation

## 2013-07-11 DIAGNOSIS — M5104 Intervertebral disc disorders with myelopathy, thoracic region: Secondary | ICD-10-CM

## 2013-07-11 NOTE — Progress Notes (Signed)
No issues overnight. Pt has some back soreness. Reports continued improvement in leg strength. Able to walk to bathroom with walker.  EXAM:  BP 111/69  Pulse 80  Temp(Src) 98 F (36.7 C) (Oral)  Resp 20  Ht 5\' 8"  (1.727 m)  Wt 63.05 kg (139 lb)  BMI 21.14 kg/m2  SpO2 99%  Awake, alert, oriented  Speech fluent, appropriate  CN grossly intact  5/5 BUE 4/5 BLE   IMPRESSION:  48 y.o. male POD# 5 s/p T9-10 lami for decompression, improving BLE strength  PLAN: - Cont PT/OT - Eval by PMR for IPR admission
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
## 2013-07-12 ENCOUNTER — Inpatient Hospital Stay (HOSPITAL_COMMUNITY)
Admission: RE | Admit: 2013-07-12 | Discharge: 2013-07-20 | DRG: 945 | Disposition: A | Discharge status: Home Health | Payer: Medicaid Other | Source: Intra-hospital | Attending: Physical Medicine & Rehabilitation | Admitting: Physical Medicine & Rehabilitation

## 2013-07-12 DIAGNOSIS — R6884 Jaw pain: Secondary | ICD-10-CM

## 2013-07-12 DIAGNOSIS — G4700 Insomnia, unspecified: Secondary | ICD-10-CM

## 2013-07-12 DIAGNOSIS — G8220 Paraplegia, unspecified: Secondary | ICD-10-CM

## 2013-07-12 DIAGNOSIS — M5104 Intervertebral disc disorders with myelopathy, thoracic region: Secondary | ICD-10-CM

## 2013-07-12 DIAGNOSIS — G9520 Unspecified cord compression: Secondary | ICD-10-CM

## 2013-07-12 DIAGNOSIS — IMO0002 Reserved for concepts with insufficient information to code with codable children: Secondary | ICD-10-CM

## 2013-07-12 DIAGNOSIS — S24109A Unspecified injury at unspecified level of thoracic spinal cord, initial encounter: Secondary | ICD-10-CM | POA: Diagnosis present

## 2013-07-12 DIAGNOSIS — R259 Unspecified abnormal involuntary movements: Secondary | ICD-10-CM

## 2013-07-12 DIAGNOSIS — Z5189 Encounter for other specified aftercare: Principal | ICD-10-CM

## 2013-07-12 DIAGNOSIS — F17200 Nicotine dependence, unspecified, uncomplicated: Secondary | ICD-10-CM

## 2013-07-12 MED ORDER — PROCHLORPERAZINE EDISYLATE 5 MG/ML IJ SOLN
5.0000 mg | Freq: Four times a day (QID) | INTRAMUSCULAR | Status: DC | PRN
  Filled 2013-07-12: qty 2

## 2013-07-12 MED ORDER — BISACODYL 10 MG RE SUPP
10.0000 mg | Freq: Every day | RECTAL | Status: DC | PRN
Start: 2013-07-12 — End: 2013-07-20
  Filled 2013-07-12: qty 1

## 2013-07-12 MED ORDER — BACLOFEN 5 MG HALF TABLET
5.0000 mg | ORAL_TABLET | Freq: Two times a day (BID) | ORAL | Status: DC | PRN
  Administered 2013-07-13 – 2013-07-15 (×5): 5 mg via ORAL
  Filled 2013-07-12 (×6): qty 1

## 2013-07-12 MED ORDER — PROCHLORPERAZINE MALEATE 5 MG PO TABS: 5.0000 mg | ORAL_TABLET | Freq: Four times a day (QID) | ORAL | Status: DC | PRN

## 2013-07-12 MED ORDER — NICOTINE 21 MG/24HR TD PT24
21.0000 mg | MEDICATED_PATCH | Freq: Every day | TRANSDERMAL | Status: DC
  Administered 2013-07-13 – 2013-07-18 (×3): 21 mg via TRANSDERMAL
  Filled 2013-07-12 (×9): qty 1

## 2013-07-12 MED ORDER — FLEET ENEMA 7-19 GM/118ML RE ENEM: 1.0000 | ENEMA | Freq: Once | RECTAL | Status: AC | PRN

## 2013-07-12 MED ORDER — OXYCODONE HCL 5 MG PO TABS
5.0000 mg | ORAL_TABLET | ORAL | Status: DC | PRN
  Administered 2013-07-12 – 2013-07-20 (×41): 10 mg via ORAL
  Filled 2013-07-12 (×41): qty 2

## 2013-07-12 MED ORDER — ACETAMINOPHEN 325 MG PO TABS
325.0000 mg | ORAL_TABLET | ORAL | Status: DC | PRN
  Filled 2013-07-12: qty 2

## 2013-07-12 MED ORDER — DOCUSATE SODIUM 100 MG PO CAPS
100.0000 mg | ORAL_CAPSULE | Freq: Two times a day (BID) | ORAL | Status: DC
  Administered 2013-07-13 – 2013-07-14 (×3): 100 mg via ORAL
  Filled 2013-07-12 (×6): qty 1

## 2013-07-12 MED ORDER — ENOXAPARIN SODIUM 40 MG/0.4ML ~~LOC~~ SOLN
40.0000 mg | SUBCUTANEOUS | Status: DC
  Filled 2013-07-12 (×8): qty 0.4

## 2013-07-12 MED ORDER — ALUM & MAG HYDROXIDE-SIMETH 200-200-20 MG/5ML PO SUSP
30.0000 mL | ORAL | Status: DC | PRN
  Filled 2013-07-12: qty 30

## 2013-07-12 MED ORDER — SENNA 8.6 MG PO TABS
1.0000 | ORAL_TABLET | Freq: Two times a day (BID) | ORAL | Status: DC
  Administered 2013-07-13 – 2013-07-14 (×3): 8.6 mg via ORAL
  Filled 2013-07-12 (×7): qty 1

## 2013-07-12 MED ORDER — TRAMADOL HCL 50 MG PO TABS
50.0000 mg | ORAL_TABLET | Freq: Four times a day (QID) | ORAL | Status: DC | PRN
Start: 2013-07-12 — End: 2013-07-20
  Administered 2013-07-15 – 2013-07-20 (×8): 50 mg via ORAL
  Filled 2013-07-12 (×8): qty 1

## 2013-07-12 MED ORDER — PROCHLORPERAZINE 25 MG RE SUPP
12.5000 mg | Freq: Four times a day (QID) | RECTAL | Status: DC | PRN
Start: 2013-07-12 — End: 2013-07-20
  Filled 2013-07-12: qty 1

## 2013-07-12 MED ORDER — GUAIFENESIN-DM 100-10 MG/5ML PO SYRP: 5.0000 mL | ORAL_SOLUTION | Freq: Four times a day (QID) | ORAL | Status: DC | PRN
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
## 2013-07-12 NOTE — Progress Notes (Signed)
Report called to 4W; pt. transported via wheelchair to 4W25.

## 2013-07-12 NOTE — Progress Notes (Signed)
Patient ID: Eddie Rocha, male   DOB: Jan 20, 1966, 48 y.o.   MRN: [id number] Pt was admitted to room 4W25.  Vital sign are stable
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
## 2013-07-12 NOTE — Progress Notes (Signed)
Rehab admissions - Evaluated for possible admission.  I met with patient and he would like inpatient rehab prior to home with his brother.  Bed available and can admit to acute inpatient rehab today.  Call me for questions.  #[phone]

## 2013-07-12 NOTE — H&P (Signed)
Physical Medicine and Rehabilitation Admission H&P  Chief Complaint   Patient presents with   .  Back Pain   HPI: Eddie Rocha is a 48 y.o. male who was admitted on 07/06/13 with complaints of back pain and BLE weakness with inability to walk. Patient reported that he had been wrestling with family member 5 days PTA with onset of pain and weakness with LE numbness. MRI spine with compressive lesion T9-T10 with edema and underlying thoracic disc herniation. He was taken to OR 07/07/13 for decompression of spinal cord by Dr. Kathyrn Sheriff. He continues with paraparesis as well as back pain. Therapies initiated and rehab team recommending CIR.    Review of Systems  HENT: Positive for hearing loss.  Eyes: Negative for blurred vision and double vision.  Cardiovascular: Negative for chest pain and palpitations.  Gastrointestinal: Negative for heartburn, vomiting and constipation.  Genitourinary: Negative for dysuria, urgency and frequency.  Musculoskeletal: Positive for back pain and myalgias.  Neurological: Positive for tingling, sensory change (improvement in sensation bilateral thighs today. ) and focal weakness. Negative for dizziness and headaches.  Psychiatric/Behavioral: Negative for depression. The patient is not nervous/anxious.   Past Medical History   Diagnosis  Date   .  Hernia     Past Surgical History   Procedure  Laterality  Date   .  Mandible fracture surgery     .  Hernia repair     .  Laminectomy   07/06/2013     T9 T10 CORD COMPRESSION   .  Lumbar laminectomy/decompression microdiscectomy  N/A  07/06/2013     Procedure: LUMBAR LAMINECTOMY, DECOMPRESSION T9 to T10; Surgeon: Consuella Lose, MD; Location: [facility]; Service: Neurosurgery; Laterality: N/A;    Family History   Problem  Relation  Age of Onset   .  Cancer  Brother      throat    Social History: Lives with brother. 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 Brother can provide intermittent  supervision--works odd jobs and is in and out during the day. He reports that he has been smoking Cigarettes--1 PPD. He has a 28 pack-year smoking history. He has never used smokeless tobacco. He reports that he drinks alcohol--6 pack on the weekends. He reports that he does not use illicit drugs  Allergies: No Known Allergies  Medications Prior to Admission   Medication  Sig  Dispense  Refill   .  [EXPIRED] diazepam (VALIUM) 10 MG tablet  Take 0.5 tablets (5 mg total) by mouth 2 (two) times daily.  10 tablet  0   .  [EXPIRED] predniSONE (DELTASONE) 20 MG tablet  Take 3 tablets (60 mg total) by mouth daily with breakfast.  15 tablet  0    Home:  Home Living  Family/patient expects to be discharged to:: Private residence  Living Arrangements: Other relatives  Available Help at Discharge: Family  Type of Home: Mobile home  Home Access: Stairs to enter  Technical brewer of Steps: 4  Entrance Stairs-Rails: Left;Right  Home Layout: One level  Home Equipment: None  Additional Comments: Tub shower with curtain, no grab bars, standard height toilet. Can get a walker into bathroom. Not driving.  Functional History:  Prior Function  Comments: Prior to most recent episode of weakness  Functional Status:  Mobility:  Bed Mobility  Bed Mobility: Not assessed  Rolling Right: 5: Supervision;With rail  Rolling Right: Patient Percentage: 40%  Right Sidelying to Sit: 5: Supervision;With rails;HOB elevated  Right Sidelying to  Sit: Patient Percentage: 30%  Sitting - Scoot to Edge of Bed: 5: Supervision  Transfers  Transfers: Sit to Stand;Stand to Sit  Sit to Stand: 4: Min guard;With upper extremity assist;From chair/3-in-1  Sit to Stand: Patient Percentage: 50%  Stand to Sit: 4: Min guard;With upper extremity assist;To chair/3-in-1  Stand to Sit: Patient Percentage: 60%  Stand Pivot Transfers: 1: +2 Total assist  Stand Pivot Transfers: Patient Percentage: 30%  Ambulation/Gait   Ambulation/Gait Assistance: 4: Min assist  Ambulation Distance (Feet): 175 Feet  Assistive device: Rolling walker  Ambulation/Gait Assistance Details: Cues for increasing BOS, upright posture, decreased step length, increased DF, and safe use of RW. pt needed 6 standing rest breaks during ambulation.  Gait Pattern: Step-through pattern;Decreased stride length;Decreased dorsiflexion - right;Decreased dorsiflexion - left;Scissoring;Narrow base of support;Trunk flexed  Stairs: No  Wheelchair Mobility  Wheelchair Mobility: No  ADL:  ADL  Grooming: Dance movement psychotherapist;Wash/dry hands;Set up  Where Assessed - Grooming: Supine, head of bed up  Toilet Transfer: +2 Total assistance  Toilet Transfer Method: Sit to stand  Toilet Transfer Equipment: Raised toilet seat with arms (or 3-in-1 over toilet)  Equipment Used: Gait belt;Rolling walker  Transfers/Ambulation Related to ADLs: Pt only completing sit<> stand x2 at eob with knee flexion and tactile cues to help activate upright posture. Pt with decr feeling in bil feet  ADL Comments: Pt reports incr numbness in feet upon standing. pt needed mulitiple cues and tactile cues to sequence bed mobilty. pt with poor recall of sequence and attempting to twist. Pt once EOB internally distracted by pain. pt return to supine at end of session due to pain level and poor safety awareness  Cognition:  Cognition  Overall Cognitive Status: Impaired/Different from baseline  Orientation Level: Oriented X4  Cognition  Arousal/Alertness: Awake/alert  Behavior During Therapy: WFL for tasks assessed/performed  Overall Cognitive Status: Impaired/Different from baseline  Area of Impairment: Attention;Awareness;Problem solving  Current Attention Level: Alternating  Memory: Decreased short-term memory  Awareness: Anticipatory  Problem Solving: Difficulty sequencing;Requires verbal cues  General Comments: pt needs frequent cueing for staying on task and following safety  cues.    Physical Exam:  Blood pressure 110/75, pulse 87, temperature 97.6 F (36.4 C), temperature source Oral, resp. rate 18, height 5\' 8"  (1.727 m), weight 63.05 kg (139 lb), SpO2 100.00%.   Constitutional: He is oriented to person, place, and time. He appears well-developed and well-nourished.  HENT:  Head: Normocephalic and atraumatic.  Eyes: Conjunctivae are normal. Pupils are equal, round, and reactive to light.  Neck: Normal range of motion. Neck supple.  Cardiovascular: Normal rate and regular rhythm.  No murmur heard.  Respiratory: Effort normal and breath sounds normal. No respiratory distress. He has no wheezes.  GI: Soft. Bowel sounds are normal. He exhibits no distension. There is no tenderness.  Musculoskeletal: He exhibits no edema and no tenderness.  Neurological: He is alert and oriented to person, place, and time.  Sensory deficits LLE. BLE with ataxia and LLE>RLE paraparesis. Decreased PP and LT below the umbilicus. LE grossly 3+ to 4-/5 proximal to distal. DTR's trace. No resting tone. UE's normal  Skin: Skin is warm and dry.  Upper thoracic incision with skin glue--clean, dry and intact. Sensitive to touch.  Psychiatric: He has a normal mood and affect. His behavior is normal. Judgment and thought content normal.     Post Admission Physician Evaluation:  1. 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 2. Patient is admitted to receive collaborative, interdisciplinary care between the physiatrist, rehab nursing staff, and therapy team. 3. Patient's level of medical complexity and substantial therapy needs in context of that medical necessity cannot be provided at a lesser intensity of care such as a SNF. 4. Patient has experienced substantial functional loss from his/her baseline which was documented above under the "Functional History" and "Functional Status" headings. Judging by the  patient's diagnosis, physical exam, and functional history, the patient has potential for functional progress which will result in measurable gains while on inpatient rehab. These gains will be of substantial and practical use upon discharge in facilitating mobility and self-care at the household level. 5. Physiatrist will provide 24 hour management of medical needs as well as oversight of the therapy plan/treatment and provide guidance as appropriate regarding the interaction of the two. 6. 24 hour rehab nursing will assist with bladder management, bowel management, safety, skin/wound care, disease management, medication administration, pain management and patient education and help integrate therapy concepts, techniques,education, etc. 7. PT will assess and treat for/with: Lower extremity strength, range of motion, stamina, balance, functional mobility, safety, adaptive techniques and equipment, NMR, pain mgt, education. Goals are: mod I to supervision. 8. OT will assess and treat for/with: ADL's, functional mobility, safety, upper extremity strength, adaptive techniques and equipment, NMR, pain mgt, family ed. Goals are: mod I to supervision. 9. SLP will assess and treat for/with: n/a. Goals are: n/a. 10. Case Management and Social Worker will assess and treat for psychological issues and discharge planning. 11. Team conference will be held weekly to assess progress toward goals and to determine barriers to discharge. 12. Patient will receive at least 3 hours of therapy per day at least 5 days per week. 13. ELOS: 14-18 days  14. Prognosis: excellent   Medical Problem List and Plan:  SCI with cord compression  1. DVT Prophylaxis/Anticoagulation: Pharmaceutical: Lovenox  2. Pain Management: prn medications effective.  3. Mood: Has positive outlook. Will have LCSW follow for evaluation.  4. Neuropsych: This patient is capable of making decisions on his own behalf.  5. Spasticity: Will change  valium to baclofen prn.   Meredith Staggers, MD, [facility]   07/12/2013

## 2013-07-13 ENCOUNTER — Inpatient Hospital Stay (HOSPITAL_COMMUNITY): Payer: Medicaid Other

## 2013-07-13 ENCOUNTER — Inpatient Hospital Stay (HOSPITAL_COMMUNITY): Payer: Medicaid Other | Admitting: Occupational Therapy

## 2013-07-13 DIAGNOSIS — M5104 Intervertebral disc disorders with myelopathy, thoracic region: Secondary | ICD-10-CM

## 2013-07-13 LAB — COMPREHENSIVE METABOLIC PANEL
ALT: 13 U/L (ref 0–53)
AST: 15 U/L (ref 0–37)
Albumin: 3.4 g/dL — ABNORMAL LOW (ref 3.5–5.2)
Alkaline Phosphatase: 58 U/L (ref 39–117)
BUN: 21 mg/dL (ref 6–23)
CALCIUM: 9.8 mg/dL (ref 8.4–10.5)
CO2: 25 meq/L (ref 19–32)
CREATININE: 1 mg/dL (ref 0.50–1.35)
Chloride: 96 mEq/L (ref 96–112)
GFR, EST NON AFRICAN AMERICAN: 87 mL/min — AB (ref >90)
GLUCOSE: 91 mg/dL (ref 70–99)
Potassium: 4.7 mEq/L (ref 3.7–5.3)
Sodium: 137 mEq/L (ref 137–147)
TOTAL PROTEIN: 7.8 g/dL (ref 6.0–8.3)
Total Bilirubin: 0.2 mg/dL — ABNORMAL LOW (ref 0.3–1.2)

## 2013-07-13 LAB — CBC WITH DIFFERENTIAL/PLATELET
Basophils Absolute: 0 10*3/uL (ref 0.0–0.1)
Basophils Relative: 0 % (ref 0–1)
EOS ABS: 0.6 10*3/uL (ref 0.0–0.7)
EOS PCT: 11 % — AB (ref 0–5)
HCT: 39.9 % (ref 39.0–52.0)
Hemoglobin: 13.3 g/dL (ref 13.0–17.0)
LYMPHS ABS: 1.6 10*3/uL (ref 0.7–4.0)
LYMPHS PCT: 28 % (ref 12–46)
MCH: 31.8 pg (ref 26.0–34.0)
MCHC: 33.3 g/dL (ref 30.0–36.0)
MCV: 95.5 fL (ref 78.0–100.0)
MONO ABS: 0.7 10*3/uL (ref 0.1–1.0)
Monocytes Relative: 12 % (ref 3–12)
Neutro Abs: 2.9 10*3/uL (ref 1.7–7.7)
Neutrophils Relative %: 49 % (ref 43–77)
PLATELETS: 306 10*3/uL (ref 150–400)
RBC: 4.18 MIL/uL — AB (ref 4.22–5.81)
RDW: 13.6 % (ref 11.5–15.5)
WBC: 5.9 10*3/uL (ref 4.0–10.5)

## 2013-07-13 NOTE — Progress Notes (Signed)
Physical Therapy Session Note  Patient Details  Name: Eddie Rocha MRN: [id number] Date of Birth: [birth year]  Today's Date: 07/13/2013 Time: 5701-7793 Time Calculation (min): 30 min  Short Term Goals: Week 1:  PT Short Term Goal 1 (Week 1): = LTGs  Skilled Therapeutic Interventions/Progress Updates:    Session focused on functional transfers, dynamic gait training with RW through obstacle course to work on coordination and balance in simulated home environment, and neuro re-ed in standing for LE coordination and control, grading of movement, balance, and strengthening with bilateral WB to aid with ataxia: including mini squats, marches with targets for foot placement, heel raises, and challenges to balance without UE support). Returned to bed end of session with close S and cues for back precautions.  Therapy Documentation Precautions:  Precautions Precautions: Back Precaution Comments: Able to recall 2/3 back precautions Restrictions Weight Bearing Restrictions: No  Pain: Reports pain is better this PM.  See FIM for current functional status  Therapy/Group: Individual Therapy  [name] [facility] 07/13/2013, 4:01 PM

## 2013-07-13 NOTE — Evaluation (Signed)
Physical Therapy Assessment and Plan  Patient Details  Name: Eddie Rocha MRN: [id number] Date of Birth: 06-18-1966  PT Diagnosis: Abnormality of gait, Ataxic gait, Difficulty walking, Impaired sensation, Low back pain and Muscle weakness Rehab Potential: Good ELOS: 7-10 days   Today's Date: 07/13/2013 Time: 4431-5400 Time Calculation (min): 57 min  Problem List:  Patient Active Problem List   Diagnosis Date Noted  . SCI (spinal cord injury) 07/12/2013  . Spinal cord compression 07/07/2013    Past Medical History:  Past Medical History  Diagnosis Date  . Hernia    Past Surgical History:  Past Surgical History  Procedure Laterality Date  . Mandible fracture surgery    . Hernia repair    . Laminectomy  07/06/2013    T9    T10    CORD COMPRESSION  . Lumbar laminectomy/decompression microdiscectomy N/A 07/06/2013    Procedure: LUMBAR LAMINECTOMY, DECOMPRESSION  T9 to T10;  Surgeon: Consuella Lose, MD;  Location: [facility];  Service: Neurosurgery;  Laterality: N/A;    Assessment & Plan Clinical Impression: Patient is a 48 y.o. year old male with recent admission to the hospital on 07/06/13 with complaints of back pain and BLE weakness with inability to walk. Patient reported that he had been wrestling with family member 5 days PTA with onset of pain and weakness with LE numbness. MRI spine with compressive lesion T9-T10 with edema and underlying thoracic disc herniation. He was taken to OR 07/07/13 for decompression of spinal cord by Dr. Kathyrn Sheriff. He continues with paraparesis as well as back pain. Patient transferred to CIR on 07/12/2013 .   Patient currently requires min A (max A gait without AD) with mobility secondary to muscle weakness, decreased cardiorespiratoy endurance, impaired timing and sequencing, ataxia and decreased coordination and decreased sitting balance, decreased standing balance, decreased postural control, decreased balance strategies and difficulty  maintaining precautions.  Prior to hospitalization, patient was independent  with mobility and lived with Family (his brother) in a Mobile home home.  Home access is 4Stairs to enter.  Patient will benefit from skilled PT intervention to maximize safe functional mobility, minimize fall risk and decrease caregiver burden for planned discharge home with intermittent assist.  Anticipate patient will benefit from follow up [facility] at discharge.  PT Assessment Rehab Potential: Good PT Patient demonstrates impairments in the following area(s): Balance;Endurance;Motor;Pain;Sensory;Skin Integrity PT Transfers Functional Problem(s): Bed Mobility;Bed to Chair;Car;Furniture PT Locomotion Functional Problem(s): Ambulation;Stairs PT Plan PT Intensity: Minimum of 1-2 x/day ,45 to 90 minutes PT Frequency: 5 out of 7 days PT Duration Estimated Length of Stay: 7-10 days PT Treatment/Interventions: Ambulation/gait training;Balance/vestibular training;Community reintegration;Discharge planning;Disease management/prevention;DME/adaptive equipment instruction;Functional mobility training;Neuromuscular re-education;Pain management;Patient/family education;Psychosocial support;Skin care/wound management;Splinting/orthotics;Stair training;Therapeutic Activities;Therapeutic Exercise;UE/LE Strength taining/ROM;UE/LE Coordination activities;Wheelchair propulsion/positioning PT Transfers Anticipated Outcome(s): mod I basic; S car PT Locomotion Anticipated Outcome(s): mod I household gait; S/min A stairs PT Recommendation Follow Up Recommendations: Home health PT Patient destination: Home Equipment Recommended: Rolling walker with 5" wheels  Skilled Therapeutic Intervention Individual treatment initiated with focus on dynamic standing balance, gait training with and without AD (max A without AD and min/steady A with RW with focus on widening BOS (tendency to scissor with LLE), fluidity of gait pattern, and posture), reviewing  back precautions, neuro re-ed to work on balance and coordination in LE's for toe taps and step ups with UE support, and Nustep LE's only for functional strengthening and endurance x 10 min on level 7. Did not note any cognitive impairments present during  evaluation and discussed safety plan with RN at this time. Pt returned to bed at end of session due to back pain and RN present with patient performing assessment on spine.  PT Evaluation Precautions/Restrictions Precautions Precautions: Back Precaution Comments: Able to recall 2/3 back precautions Restrictions Weight Bearing Restrictions: No Pain Pain Assessment Pain Assessment: 0-10 Pain Score: 10-Worst pain ever Pain Location: Back Pain Orientation: Mid Pain Onset: On-going Patients Stated Pain Goal: 0 Pain Intervention(s): Medication (See eMAR) Home Living/Prior Functioning Home Living Available Help at Discharge: Family;Available PRN/intermittently Type of Home: Mobile home Home Access: Stairs to enter Entrance Stairs-Number of Steps: 4 Entrance Stairs-Rails: Left;Right;Can reach both Home Layout: One level  Lives With: Family (his brother) Prior Function Driving: No Vocation:  (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 Continue to assess and monitor. Sensation Sensation Light Touch: Appears Intact (c/o numbness in BLE from just above knees down) Proprioception: Impaired Detail Proprioception Impaired Details: Impaired RLE;Impaired LLE Coordination Gross Motor Movements are Fluid and Coordinated: No Coordination and Movement Description: impaired: ataxic in LE's Heel Shin Test: [facility] Motor  Motor Motor: Ataxia  Locomotion   Ambulation Ambulation/Gait Assistance: 2: Max assist  Trunk/Postural Assessment  Cervical Assessment Cervical Assessment: Within Functional Limits Thoracic Assessment Thoracic Assessment: Exceptions to [facility] (back precautions) Lumbar Assessment Lumbar Assessment: Exceptions to [facility] (back precautions) Postural Control Postural Control: Deficits on evaluation  Balance Balance Balance Assessed: Yes Static Sitting Balance Static Sitting - Level of Assistance: 5: Stand by assistance Dynamic Sitting Balance Dynamic Sitting - Level of Assistance: 5: Stand by assistance Static Standing Balance Static Standing - Level of Assistance: 3: Mod assist (without UE support) Dynamic Standing Balance Dynamic Standing - Level of Assistance: 2: Max assist;3: Mod assist (without UE support) Extremity Assessment      RLE Assessment RLE Assessment: Exceptions to [facility] RLE Strength RLE Overall Strength Comments: ROM WFL; strength grossly 4/5; decreased muscular endurance LLE Assessment LLE Assessment: Exceptions to [facility] LLE Strength LLE Overall Strength Comments: ROM WFL; strength grossly 4-/5; decreased muscular endurance  FIM:  FIM - Control and instrumentation engineer Devices: Arm rests Bed/Chair Transfer: 5: Supine > Sit: Supervision (verbal cues/safety issues);4: Sit > Supine: Min A (steadying pt. > 75%/lift 1 leg);4: Bed > Chair or W/C: Min A (steadying Pt. > 75%);4: Chair or W/C > Bed: Min A (steadying Pt. > 75%) FIM - Locomotion: Wheelchair Locomotion: Wheelchair: 5: Travels 150 ft or more: maneuvers on rugs and over door sills with supervision, cueing or coaxing FIM - Locomotion: Ambulation Ambulation/Gait Assistance: 2: Max assist Locomotion: Ambulation: 1: Travels less than 50 ft with maximal assistance (Pt: 25 - 49%) FIM - Locomotion: Stairs Locomotion: Scientist, physiological: Hand rail - 2 Locomotion: Stairs: 2: Up and Down 4 - 11 stairs with minimal assistance (Pt.>75%)    Refer to Care Plan for Long Term Goals  Recommendations for other services: None  Discharge Criteria: Patient will be discharged from PT if patient refuses treatment 3 consecutive times without medical reason, if treatment goals not met, if there is a change in medical status, if patient makes no progress towards goals or if patient is discharged from hospital.  The above assessment, treatment plan, treatment alternatives and goals were discussed and mutually agreed upon: by patient  Allayne Gitelman 07/13/2013,  9:42 AM

## 2013-07-13 NOTE — Progress Notes (Signed)
Patient information reviewed and entered into eRehab system by Rubyann Lingle, RN, CRRN, PPS Coordinator.  Information including medical coding and functional independence measure will be reviewed and updated through discharge.    

## 2013-07-13 NOTE — Evaluation (Signed)
Occupational Therapy Assessment and Plan & Session Notes  Patient Details  Name: Eddie Rocha MRN: [id number] Date of Birth: June 15, 1966  OT Diagnosis: acute pain, ataxia and muscle weakness (generalized) Rehab Potential: Rehab Potential: Good ELOS: 7-10 days   Today's Date: 07/13/2013  Problem List:  Patient Active Problem List   Diagnosis Date Noted  . SCI (spinal cord injury) 07/12/2013  . Spinal cord compression 07/07/2013    Past Medical History:  Past Medical History  Diagnosis Date  . Hernia    Past Surgical History:  Past Surgical History  Procedure Laterality Date  . Mandible fracture surgery    . Hernia repair    . Laminectomy  07/06/2013    T9    T10    CORD COMPRESSION  . Lumbar laminectomy/decompression microdiscectomy N/A 07/06/2013    Procedure: LUMBAR LAMINECTOMY, DECOMPRESSION  T9 to T10;  Surgeon: Consuella Lose, MD;  Location: [facility];  Service: Neurosurgery;  Laterality: N/A;    Assessment & Plan Clinical Impression: Eddie Rocha is a 48 y.o. male who was admitted on 07/06/13 with complaints of back pain and BLE weakness with inability to walk. Patient reported that he had been wrestling with family member 5 days PTA with onset of pain and weakness with LE numbness. MRI spine with compressive lesion T9-T10 with edema and underlying thoracic disc herniation. He was taken to OR 07/07/13 for decompression of spinal cord by Dr. Kathyrn Sheriff. He continues with paraparesis as well as back pain. Therapies initiated and rehab team recommending CIR.  Patient transferred to CIR on 07/12/2013 .    Patient currently requires min>total with basic self-care skills and IADL secondary to muscle weakness and muscle joint tightness, abnormal tone, unbalanced muscle activation, ataxia, decreased coordination and decreased motor planning and decreased sitting balance, decreased standing balance, decreased postural control, decreased balance strategies and difficulty  maintaining precautions.  Prior to hospitalization, patient could complete ADLs indepednently.  Patient will benefit from skilled intervention to increase independence with basic self-care skills prior to discharge home with brother.  Anticipate patient will require intermittent supervision and follow up home health.  OT - End of Session Activity Tolerance: Tolerates < 10 min activity, no significant change in vital signs Endurance Deficit: Yes OT Assessment Rehab Potential: Good Barriers to Discharge:  (none known at this time) OT Patient demonstrates impairments in the following area(s): Balance;Motor;Endurance;Pain;Perception;Safety;Sensory;Skin Integrity OT Basic ADL's Functional Problem(s): Grooming;Bathing;Dressing;Toileting OT Advanced ADL's Functional Problem(s): Simple Meal Preparation;Light Housekeeping OT Transfers Functional Problem(s): Toilet;Tub/Shower OT Additional Impairment(s): None OT Plan OT Intensity: Minimum of 1-2 x/day, 45 to 90 minutes OT Frequency: 5 out of 7 days OT Duration/Estimated Length of Stay: 7-10 days OT Treatment/Interventions: Balance/vestibular training;Community reintegration;Discharge planning;DME/adaptive equipment instruction;Functional mobility training;Neuromuscular re-education;Pain management;Patient/family education;Psychosocial support;Self Care/advanced ADL retraining;Skin care/wound managment;Therapeutic Activities;Therapeutic Exercise;UE/LE Strength taining/ROM;UE/LE Coordination activities OT Self Feeding Anticipated Outcome(s): independent (current level) OT Basic Self-Care Anticipated Outcome(s): mod I OT Toileting Anticipated Outcome(s): mod I OT Bathroom Transfers Anticipated Outcome(s): mod I OT Recommendation Recommendations for Other Services:  (none at this time) Patient destination: Home Follow Up Recommendations: Home health OT Equipment Recommended: 3 in 1 bedside comode;Tub/shower bench  Precautions/Restrictions   Precautions Precautions: Back Precaution Comments: Able to recall 2/3 back precautions Restrictions Weight Bearing Restrictions: No  General Chart Reviewed: Yes Amount of Missed OT Time (min): 30 Minutes Family/Caregiver Present: No  Pain Pain Assessment Pain Assessment: 0-10 Pain Score: 10-Worst pain ever Pain Type: Acute pain;Surgical pain Pain Location: Back Pain Orientation: Upper;Mid  Pain Onset: On-going Patients Stated Pain Goal: 0 Pain Intervention(s): RN made aware Multiple Pain Sites: No  Home Living/Prior Walker expects to be discharged to:: Private residence Living Arrangements: Other relatives Available Help at Discharge: Family;Available PRN/intermittently Type of Home: Mobile home Home Access: Stairs to enter Entrance Stairs-Number of Steps: 4 Entrance Stairs-Rails: Left;Right;Can reach both Home Layout: One level  Lives With: Family (his brother) IADL History Homemaking Responsibilities: Yes Meal Prep Responsibility: Primary Laundry Responsibility: Primary Cleaning Responsibility: Primary Bill Paying/Finance Responsibility: Primary Shopping Responsibility: Primary Current License: No Mode of Transportation: Family 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" Prior Function Level of Independence: Independent with basic ADLs;Independent with homemaking with ambulation;Independent with transfers;Independent with gait Driving: No Vocation:  (states he sometimes helps 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 Continue to assess and monitor. (patient aggitated during OT session, OT did wake patient from nap)  Sensation Sensation Light Touch: Appears Intact (c/o numbness in BLE from just above knees down) Proprioception: Impaired Detail Proprioception Impaired Details: Impaired RLE;Impaired LLE Additional Comments: BUEs apear intact Coordination Gross Motor Movements are Fluid and Coordinated: Yes (BUEs) Fine Motor Movements are Fluid and Coordinated: Yes (BUEs) Coordination and Movement Description: impaired: ataxic in LE's Heel Shin Test: [facility]  Motor  Motor Motor: Ataxia  Trunk/Postural Assessment  Cervical Assessment Cervical Assessment: Within Functional Limits Thoracic Assessment Thoracic Assessment: Exceptions to [facility] (back precautions) Lumbar Assessment Lumbar Assessment: Exceptions to [facility] (back precautions) Postural Control Postural Control: Deficits on evaluation   Balance Balance Balance Assessed: Yes Static Sitting Balance Static Sitting - Level of Assistance: 5: Stand by assistance Dynamic Sitting Balance Dynamic Sitting - Level of Assistance: 5: Stand by assistance Static Standing Balance Static Standing - Level of Assistance: 3: Mod assist (without UE support) Dynamic Standing Balance Dynamic Standing - Level of Assistance: 2: Max assist;3: Mod assist (without UE support)  Extremity/Trunk Assessment RUE Assessment RUE Assessment: Within Functional Limits (can benefit from strengthening to BUEs) LUE Assessment LUE Assessment: Within Functional Limits (can benefit from strengthening to BUEs)  FIM:  FIM - Toileting Toileting steps completed by patient: Adjust clothing prior to toileting;Performs perineal hygiene;Adjust clothing after toileting Toileting: 4: Steadying assist FIM - Ship broker Devices: Arm rests Bed/Chair Transfer: 5: Supine > Sit: Supervision (verbal cues/safety issues);4: Sit > Supine: Min A (steadying pt. > 75%/lift 1 leg);4: Bed > Chair or W/C: Min A (steadying Pt. > 75%);4: Chair or W/C > Bed: Min A (steadying Pt. > 75%) FIM - Radio producer Devices: Mining engineer Transfers: 4-To toilet/BSC: Min A (steadying Pt. > 75%);4-From toilet/BSC: Min A (steadying Pt. > 75%) (standing to urinate)   Refer to Care Plan for Long Term Goals  Recommendations for other services: None at this time.  Discharge Criteria: Patient will be discharged from OT if patient refuses treatment 3 consecutive times without medical reason, if treatment goals not met, if there is a change in medical status, if patient makes no progress towards  goals or if patient is discharged from hospital.  The above assessment, treatment plan, treatment alternatives and goals were discussed and mutually agreed upon: by patient  ---------------------------------------------------------------------------------------------------------------  SESSION NOTES  Session #1 1030-1100 - 30 Minutes Calculated time 30 minutes missed time secondary to refusal, see below Individual Therapy 10/10 complaints of pain Initial 1:1 occupational therapy evaluation started. Patient found prone in bed asleep. Therapist woke patient for therapy session. Patient with increased aggravation upon waking him and stated "I just need to sleep". Therapist asked patient some evaluation questions and patient answered all questions appropriately. Therapist encouraged patient to participate in bathing task, patient stated "no I need to sleep!". Therapist encouraged patient to work with therapist on ambulating to bathroom, patient willing to do this. Patient engaged in bed mobility and stood with rolling walker and minimal assistance. Patient then ambulated <> bathroom with occasional  steady assist. Patient stood to urinate with supervision. Patient would get aggravated when therapist provided steady/min assist; he would state "I can do it, take your hands off me". Patient transferred back to bed with supervision, again telling therapist not to help him. Therapist assisted with re-positioning patient back in bed and left patient in side lying with all needed items within reach. Patient was able to verbalize 2/3 back precautions, but during functional tasks would break the back precautions.   Session #2 7034-0352 - 73 Minutes Individual Therapy Patient with 8/10 complaints of pain Patient found supine in bed with sister, Tamela Oddi present. Patient engaged in bed mobility and sat edge of bed, patient then ambulated > w/c using RW with close supervision. While seated in w/c, patient donned shoes with set-up/supervision assistance. Patient did break back precautions and therapist immediately corrected patient. From here, patient propelled self from room > ADL apartment. In ADL apartment, therapist introduced tub transfer bench and discussed BSC; patient stated he did not need a BSC. Patient then engaged in stand pivot transfer > bed using walls to correct balance, performed sit>supine and rolling left <>right. Therapist educated patient on log rolling in order to prevent breaking back precautions. From here, patient sat edge of bed and transferred back to w/c performing squat pivot transfer. Therapist discussed with patient importance of performing squat pivot transfer as opposed to stand pivot when he did not have his RW. Patient propelled self > therapy gym and engaged in some UB and LB exercises. Therapist propelled patient back to room and therapist left patient with RN present to administer meds and assist patient to bathroom.   Rolonda Pontarelli 07/13/2013, 11:15 AM

## 2013-07-13 NOTE — Progress Notes (Signed)
[facility] Individual Statement of Services  Patient Name:  Eddie Rocha  Date:  07/13/2013  Welcome to the [facility].  Our goal is to provide you with an individualized program based on your diagnosis and situation, designed to meet your specific needs.  With this comprehensive rehabilitation program, you will be expected to participate in at least 3 hours of rehabilitation therapies Monday-Friday, with modified therapy programming on the weekends.  Your rehabilitation program will include the following services:  Physical Therapy (PT), Occupational Therapy (OT), 24 hour per day rehabilitation nursing, Therapeutic Recreation (TR), Neuropsychology, Case Management (Social Worker), Rehabilitation Medicine, Nutrition Services and Pharmacy Services  Weekly team conferences will be held on Tuesdays to discuss your progress.  Your Social Worker will talk with you frequently to get your input and to update you on team discussions.  Team conferences with you and your family in attendance may also be held.  Expected length of stay: 7 to 10 days  Overall anticipated outcome: Modified Independent/Supervision with car transfers and stairs  Depending on your progress and recovery, your program may change. Your Social Worker will coordinate services and will keep you informed of any changes. Your Social Worker's name and contact numbers are listed  below.  The following services may also be recommended but are not provided by the [facility] will be made to provide these services after discharge if needed.  Arrangements include referral to agencies that provide these services.  Your insurance has been verified to be:  Self pay  You do not currently have a primary care doctor.  Pertinent  information will be shared with your doctor and your insurance company.  Social Worker:  Alfonse Alpers, LCSW  [phone] or ([phone]  Information discussed with and copy given to patient by: Trey Sailors, 07/13/2013, 11:54 AM

## 2013-07-13 NOTE — Progress Notes (Signed)
  Subjective/Complaints: Uneventful night. Slept well. Slow to arouse this am. A 12 point review of systems has been performed and if not noted above is otherwise negative.   Objective: Vital Signs: Blood pressure 123/81, pulse 86, temperature 98.2 F (36.8 C), temperature source Oral, resp. rate 20, height 5' 8.11" (1.73 m), weight 63 kg (138 lb 14.2 oz), SpO2 100.00%. No results found.  Recent Labs  07/13/13 0500  WBC 5.9  HGB 13.3  HCT 39.9  PLT 306    Recent Labs  07/13/13 0500  NA 137  K 4.7  CL 96  GLUCOSE 91  BUN 21  CREATININE 1.00  CALCIUM 9.8   CBG (last 3)  No results found for this basename: GLUCAP,  in the last 72 hours  Wt Readings from Last 3 Encounters:  07/12/13 63 kg (138 lb 14.2 oz)  07/07/13 63.05 kg (139 lb)  07/07/13 63.05 kg (139 lb)    Physical Exam:  Constitutional: He is oriented to person, place, and time. He appears well-developed and well-nourished.  HENT:  Head: Normocephalic and atraumatic.  Eyes: Conjunctivae are normal. Pupils are equal, round, and reactive to light.  Neck: Normal range of motion. Neck supple.  Cardiovascular: Normal rate and regular rhythm.  No murmur heard.  Respiratory: Effort normal and breath sounds normal. No respiratory distress. He has no wheezes.  GI: Soft. Bowel sounds are normal. He exhibits no distension. There is no tenderness.  Musculoskeletal: He exhibits no edema and no tenderness.  Neurological: He is alert and oriented to person, place, and time.    Decreased PP and LT below the umbilicus (1/2). LE grossly 3+ to 4-/5 proximal to distal. DTR's trace. No resting tone. UE's normal  Skin: Skin is warm and dry.    thoracic incision with skin glue--clean, dry and intact. Sensitive to touch.  Psychiatric: He has a normal mood and affect. His behavior is normal. Slow to arouse this am   Assessment/Plan: 1. 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 Physiatrist is providing close team supervision and 24 hour management of active medical problems listed below. Physiatrist and rehab team continue to assess barriers to discharge/monitor patient progress toward functional and medical goals. FIM:                   Comprehension Comprehension Mode: Auditory Comprehension: 5-Understands basic 90% of the time/requires cueing < 10% of the time  Expression Expression Mode: Verbal Expression: 5-Expresses basic 90% of the time/requires cueing < 10% of the time.  Social Interaction Social Interaction: 5-Interacts appropriately 90% of the time - Needs monitoring or encouragement for participation or interaction.  Problem Solving Problem Solving: 5-Solves basic 90% of the time/requires cueing < 10% of the time  Memory Memory: 5-Recognizes or recalls 90% of the time/requires cueing < 10% of the time  Medical Problem List and Plan:  SCI with cord compression  1. DVT Prophylaxis/Anticoagulation: Pharmaceutical: Lovenox  2. Pain Management: prn medications effective thus far. 3. Mood: Has positive outlook. Will have LCSW follow for evaluation.  4. Neuropsych: This patient is capable of making decisions on his own behalf.  5. Spasticity:   baclofen prn for now. Follow for efficacy.  LOS (Days) 1 A FACE TO FACE EVALUATION WAS PERFORMED  Catelyn Friel T 07/13/2013 8:48 AM

## 2013-07-14 ENCOUNTER — Inpatient Hospital Stay (HOSPITAL_COMMUNITY): Payer: Medicaid Other

## 2013-07-14 ENCOUNTER — Inpatient Hospital Stay (HOSPITAL_COMMUNITY): Payer: Medicaid Other | Admitting: Occupational Therapy

## 2013-07-14 ENCOUNTER — Encounter (HOSPITAL_COMMUNITY): Payer: Self-pay

## 2013-07-14 MED ORDER — SENNOSIDES-DOCUSATE SODIUM 8.6-50 MG PO TABS
1.0000 | ORAL_TABLET | Freq: Two times a day (BID) | ORAL | Status: DC
  Administered 2013-07-14 – 2013-07-20 (×12): 1 via ORAL
  Filled 2013-07-14 (×11): qty 1

## 2013-07-14 NOTE — Progress Notes (Signed)
Physical Therapy Session Note  Patient Details  Name: Eddie Rocha MRN: [id number] Date of Birth: [birth year]  Today's Date: 07/14/2013 Time: 0800-0854 Time Calculation (min): 54 min  Short Term Goals: Week 1:  PT Short Term Goal 1 (Week 1): = LTGs  Skilled Therapeutic Interventions/Progress Updates:    Session focused on functional transfers and bed mobility, dynamic standing balance at sink to brush teeth and wash face, gait training with RW with min A (at first pt refusing to have therapist physically A for balance when needed but explained reasoning for safety and verbalized understanding), stair negotiation for home entry simulation with close S/steady A up/down 5 steps x 2 with bilateral rails, and focused on neuro r-ed for standing balance, balance reactions, WB through BLE, coordination, and postural control with min A overall (limiting UE support). At beginning of session pt with episode of getting up and using RW in room without therapist present and discussed with pt risk of falling and need for S/min A at this time for safety - pt verbalized understanding and staff made aware of occurrence. End of session, pt in bed with bed alarm on.  Therapy Documentation Precautions:  Precautions Precautions: Back Precaution Comments: Able to recall 2/3 back precautions Restrictions Weight Bearing Restrictions: No  Pain: Reports 15/10 pain in back - RN aware and pain medication given.  See FIM for current functional status  Therapy/Group: Individual Therapy  [name] 07/14/2013, 8:56 AM

## 2013-07-14 NOTE — Progress Notes (Signed)
Occupational Therapy Session Note  Patient Details  Name: Eddie Rocha MRN: [id number] Date of Birth: [birth year]  Today's Date: 07/14/2013 Time: 8882-8003 Time Calculation (min): 40 min  Short Term Goals: Week 1:  OT Short Term Goal 1 (Week 1): Short Term Goals = Long Term Goals  Skilled Therapeutic Interventions/Progress Updates:  Patient found supine in bed. Patient transferred out of bed and stood with RW then ambulated > therapy gym. During ambulation, patient only needed occasional steady assist; patient is adamant to walk on his own, without hands-on from therapist. Patient sat on therapy mat and therapist educated patient on correct body mechanics for sit<>stands for safety. Patient then engaged in standing activity without UE support (pipe tree). Patient needed min verbal cues in order to tell where his body was in space and correct his posture while standing. From here, patient required a couple rest breaks; therapist encouraged energy conservation techniques for safety. After activity, patient ambulated to ergometer and sat for BUE strengthening exercises for 10 minutes. After UE exercises, patient ambulated back to mat and engaged in stand>sit>side lying>supine. Therapist tried to engage patient in UE exercises in supine, but due to back pain patient was unable to participate. Patient sat edge of mat and therapist left patient seated there for next PT session.   Precautions:  Precautions Precautions: Back Precaution Comments: Able to recall 2/3 back precautions Restrictions Weight Bearing Restrictions: No  See FIM for current functional status  Therapy/Group: Individual Therapy  Kema Santaella 07/14/2013, 3:09 PM

## 2013-07-14 NOTE — Plan of Care (Signed)
Problem: RH SAFETY Goal: RH STG ADHERE TO SAFETY PRECAUTIONS W/ASSISTANCE/DEVICE STG Adhere to Safety Precautions With Assistance/Device. Supervision  Unsafe behavior noted at times. Requires reminder to contact staff

## 2013-07-14 NOTE — Plan of Care (Signed)
Problem: RH PAIN MANAGEMENT Goal: RH STG PAIN MANAGED AT OR BELOW PT'S PAIN GOAL Less than 3  Outcome: Not Progressing Reports pain as 8

## 2013-07-14 NOTE — Plan of Care (Signed)
Problem: RH SKIN INTEGRITY Goal: RH STG SKIN FREE OF INFECTION/BREAKDOWN Outcome: Progressing No infection noted

## 2013-07-14 NOTE — Progress Notes (Signed)
Subjective/Complaints: Denies pain. Slept well. A little cold but otherwise doing pretty well. A 12 point review of systems has been performed and if not noted above is otherwise negative.   Objective: Vital Signs: Blood pressure 99/66, pulse 90, temperature 98 F (36.7 C), temperature source Oral, resp. rate 18, height 5' 8.11" (1.73 m), weight 63 kg (138 lb 14.2 oz), SpO2 96.00%. No results found.  Recent Labs  07/13/13 0500  WBC 5.9  HGB 13.3  HCT 39.9  PLT 306    Recent Labs  07/13/13 0500  NA 137  K 4.7  CL 96  GLUCOSE 91  BUN 21  CREATININE 1.00  CALCIUM 9.8   CBG (last 3)  No results found for this basename: GLUCAP,  in the last 72 hours  Wt Readings from Last 3 Encounters:  07/12/13 63 kg (138 lb 14.2 oz)  07/07/13 63.05 kg (139 lb)  07/07/13 63.05 kg (139 lb)    Physical Exam:  Constitutional: He is oriented to person, place, and time. He appears well-developed and well-nourished.  HENT:  Head: Normocephalic and atraumatic.  Eyes: Conjunctivae are normal. Pupils are equal, round, and reactive to light.  Neck: Normal range of motion. Neck supple.  Cardiovascular: Normal rate and regular rhythm.  No murmur heard.  Respiratory: Effort normal and breath sounds normal. No respiratory distress. He has no wheezes.  GI: Soft. Bowel sounds are normal. He exhibits no distension. There is no tenderness.  Musculoskeletal: He exhibits no edema and no tenderness.  Neurological: He is alert and oriented to person, place, and time.    Decreased PP and LT below the umbilicus (1/2). LE grossly 3+ to 4-/5 proximal to distal. DTR's trace. Still no resting tone. UE's normal  Skin: Skin is warm and dry.    thoracic incision with skin glue--clean, dry and intact. Sensitive to touch.  Psychiatric: He has a normal mood and affect. His behavior is normal. Slow to arouse this am   Assessment/Plan: 1. 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 Physiatrist is providing close team supervision and 24 hour management of active medical problems listed below. Physiatrist and rehab team continue to assess barriers to discharge/monitor patient progress toward functional and medical goals. FIM:       FIM - Toileting Toileting steps completed by patient: Adjust clothing prior to toileting;Performs perineal hygiene;Adjust clothing after toileting Toileting: 4: Steadying assist  FIM - Radio producer Devices: Walker;Grab bars Toilet Transfers: 4-To toilet/BSC: Min A (steadying Pt. > 75%);4-From toilet/BSC: Min A (steadying Pt. > 75%) (standing to urinate)  FIM - Bed/Chair Transfer Bed/Chair Transfer Assistive Devices: Arm rests Bed/Chair Transfer: 5: Supine > Sit: Supervision (verbal cues/safety issues);4: Sit > Supine: Min A (steadying pt. > 75%/lift 1 leg);4: Bed > Chair or W/C: Min A (steadying Pt. > 75%);4: Chair or W/C > Bed: Min A (steadying Pt. > 75%)  FIM - Locomotion: Wheelchair Locomotion: Wheelchair: 5: Travels 150 ft or more: maneuvers on rugs and over door sills with supervision, cueing or coaxing FIM - Locomotion: Ambulation Ambulation/Gait Assistance: 2: Max assist Locomotion: Ambulation: 1: Travels less than 50 ft with maximal assistance (Pt: 25 - 49%)  Comprehension Comprehension Mode: Auditory Comprehension: 5-Understands basic 90% of the time/requires cueing < 10% of the time  Expression Expression Mode: Verbal Expression: 5-Expresses basic 90% of the time/requires cueing < 10% of the time.  Social  Interaction Social Interaction: 5-Interacts appropriately 90% of the time - Needs monitoring or encouragement for participation or interaction.  Problem Solving Problem Solving: 5-Solves basic 90% of the time/requires cueing < 10% of the time  Memory Memory: 5-Recognizes or recalls 90% of  the time/requires cueing < 10% of the time  Medical Problem List and Plan:  SCI with cord compression  1. DVT Prophylaxis/Anticoagulation: Pharmaceutical: Lovenox  2. Pain Management: prn medications effective thus far. 3. Mood: Has positive outlook. Will have LCSW follow for evaluation.  4. Neuropsych: This patient is capable of making decisions on his own behalf.  5. Spasticity:   baclofen prn for now. Follow for efficacy.  LOS (Days) 2 A FACE TO FACE EVALUATION WAS PERFORMED  Lillyahna Hemberger T 07/14/2013 8:45 AM

## 2013-07-14 NOTE — Progress Notes (Signed)
Occupational Therapy Session Note  Patient Details  Name: Eddie Rocha MRN: [id number] Date of Birth: 1965/10/18  Today's Date: 07/14/2013 Time: 0945-1000 Time Calculation (min): 15 min  Short Term Goals: Week 1:  OT Short Term Goal 1 (Week 1): Short Term Goals = Long Term Goals  Skilled Therapeutic Interventions/Progress Updates:    Pt asleep in bed upon arrival but easily aroused.  Pt c/o 10/10 pain in back and stated he already had medications. Pt stated he didn't sleep "at all" last night and really wanted to go back to sleep.  Pt didn't have any clean clothes and stated someone was bringing clothes tomorrow.  Pt recalled 2/3 back precautions and asleep while attempting to recall the 3rd back precaution.  Pt engaged in funcitonal amb with RW in room and stated that he couldn't do anything else.  Pt stated that earlier session caused his back to hurt even more and he was too tired to do anything else.  Therapy Documentation Precautions:  Precautions Precautions: Back Precaution Comments: Able to recall 2/3 back precautions Restrictions Weight Bearing Restrictions: No General: General Amount of Missed OT Time (min): 30 Minutes  See FIM for current functional status  Therapy/Group: Individual Therapy  Leroy Libman 07/14/2013, 10:20 AM

## 2013-07-14 NOTE — Progress Notes (Signed)
Physical Therapy Session Note  Patient Details  Name: Eddie Rocha MRN: [id number] Date of Birth: [birth year]  Today's Date: 07/14/2013 Time: 1420-1500 Time Calculation (min): 40 min  Short Term Goals: Week 1:  PT Short Term Goal 1 (Week 1): = LTGs  Skilled Therapeutic Interventions/Progress Updates:    Session focused on gait training with RW with focus on widening BOS, step length, posture, and safety; sit to stands without UE support for neuro re-ed to LE's and for balance upon standing, and strengthening exercises including heel raises in standing and Nustep with LE's only x 10 min on level 7.   Therapy Documentation Precautions:  Precautions Precautions: Back Precaution Comments: Able to recall 2/3 back precautions Restrictions Weight Bearing Restrictions: No Pain: Reports pain is present but "ok at the moment" and reports having pain medicine about an hour ago for his back.  See FIM for current functional status  Therapy/Group: Individual Therapy  [name] [facility] 07/14/2013, 3:33 PM

## 2013-07-15 ENCOUNTER — Inpatient Hospital Stay (HOSPITAL_COMMUNITY): Payer: Medicaid Other

## 2013-07-15 ENCOUNTER — Inpatient Hospital Stay (HOSPITAL_COMMUNITY): Payer: Medicaid Other | Admitting: Occupational Therapy

## 2013-07-15 NOTE — Progress Notes (Signed)
Physical Therapy Session Note  Patient Details  Name: Eddie Rocha MRN: [id number] Date of Birth: 1965-09-29  Today's Date: 07/15/2013  Short Term Goals: Week 1:  PT Short Term Goal 1 (Week 1): = LTGs  Session #1 Time: [phone] Time Calculation (min): 54 min  C/o 10/10 back pain - premedicated. Focused on gait training with RW to/from therapy gym with close S/steady A with focus on posture, step length, and BOS, neuro re-ed in standing with focus on balance activities on compliant surface and then increased challenge to complete tasks on Bosu ball with steady A and intermittent UE support needed, and introduced [place] HEP for functional strengthening and balance completing 2 sets of 10 reps each BLE.  Session #2 Time: 1130-1200 (30 min) Reports pain still present but premedicated. Focused on gait on unit with RW with focus on step length and posture with close S, simulated car transfer (truck height) with cues to maintain back precautions x 2 reps, and Nustep for LE strengthening and endurance x 10 min on level 7.  Therapy Documentation Precautions:  Precautions Precautions: Back Precaution Comments: Able to recall 2/3 back precautions Restrictions Weight Bearing Restrictions: No     See FIM for current functional status  Therapy/Group: Individual Therapy  [name] [facility] 07/15/2013, 11:30 AM

## 2013-07-15 NOTE — Progress Notes (Signed)
Subjective/Complaints: No new complaints today. Participating with therapies. Sleeping well. Pain controlled A 12 point review of systems has been performed and if not noted above is otherwise negative.   Objective: Vital Signs: Blood pressure 116/74, pulse 92, temperature 98.3 F (36.8 C), temperature source Oral, resp. rate 18, height 5' 8.11" (1.73 m), weight 63 kg (138 lb 14.2 oz), SpO2 97.00%. No results found.  Recent Labs  07/13/13 0500  WBC 5.9  HGB 13.3  HCT 39.9  PLT 306    Recent Labs  07/13/13 0500  NA 137  K 4.7  CL 96  GLUCOSE 91  BUN 21  CREATININE 1.00  CALCIUM 9.8   CBG (last 3)  No results found for this basename: GLUCAP,  in the last 72 hours  Wt Readings from Last 3 Encounters:  07/12/13 63 kg (138 lb 14.2 oz)  07/07/13 63.05 kg (139 lb)  07/07/13 63.05 kg (139 lb)    Physical Exam:  Constitutional: He is oriented to person, place, and time. He appears well-developed and well-nourished.  HENT:  Head: Normocephalic and atraumatic.  Eyes: Conjunctivae are normal. Pupils are equal, round, and reactive to light.  Neck: Normal range of motion. Neck supple.  Cardiovascular: Normal rate and regular rhythm.  No murmur heard.  Respiratory: Effort normal and breath sounds normal. No respiratory distress. He has no wheezes.  GI: Soft. Bowel sounds are normal. He exhibits no distension. There is no tenderness.  Musculoskeletal: He exhibits no edema and no tenderness.  Neurological: He is alert and oriented to person, place, and time.    Decreased PP and LT below the umbilicus (1/2). LE grossly 3+ to 4-/5 proximal to distal. DTR's trace. Still no resting tone. UE's normal  Skin: Skin is warm and dry.    thoracic incision with skin glue--clean, dry and intact .  Psychiatric: He has a normal mood and affect. His behavior is normal. Slow to arouse this am   Assessment/Plan: 1. 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 Physiatrist is providing close team supervision and 24 hour management of active medical problems listed below. Physiatrist and rehab team continue to assess barriers to discharge/monitor patient progress toward functional and medical goals. FIM:       FIM - Toileting Toileting steps completed by patient: Adjust clothing prior to toileting;Performs perineal hygiene;Adjust clothing after toileting Toileting: 4: Steadying assist  FIM - Radio producer Devices: Walker;Grab bars Toilet Transfers: 4-To toilet/BSC: Min A (steadying Pt. > 75%);4-From toilet/BSC: Min A (steadying Pt. > 75%) (standing to urinate)  FIM - Bed/Chair Transfer Bed/Chair Transfer Assistive Devices: Arm rests;HOB elevated Bed/Chair Transfer: 5: Bed > Chair or W/C: Supervision (verbal cues/safety issues);5: Chair or W/C > Bed: Supervision (verbal cues/safety issues);5: Sit > Supine: Supervision (verbal cues/safety issues);5: Supine > Sit: Supervision (verbal cues/safety issues)  FIM - Locomotion: Wheelchair Locomotion: Wheelchair: 0: Activity did not occur FIM - Locomotion: Ambulation Ambulation/Gait Assistance: 4: Min assist Locomotion: Ambulation: 4: Travels 150 ft or more with minimal assistance (Pt.>75%)  Comprehension Comprehension Mode: Auditory Comprehension: 5-Follows basic conversation/direction: With extra time/assistive device  Expression Expression Mode: Verbal Expression: 5-Expresses basic 90% of the time/requires cueing < 10% of the time.  Social Interaction Social Interaction: 5-Interacts appropriately 90% of the time - Needs monitoring or encouragement for participation or interaction.  Problem Solving Problem Solving: 5-Solves basic 90% of the time/requires cueing <  10% of the time  Memory Memory: 5-Recognizes or recalls 90% of the time/requires cueing < 10% of the  time  Medical Problem List and Plan:  SCI with cord compression  1. DVT Prophylaxis/Anticoagulation: Pharmaceutical: Lovenox  2. Pain Management: prn medications effective thus far. 3. Mood: Has positive outlook. Continue to provide egosupport 4. Neuropsych: This patient is capable of making decisions on his own behalf.  5. Spasticity:   baclofen prn has been helpful.  LOS (Days) 3 A FACE TO FACE EVALUATION WAS PERFORMED  SWARTZ,ZACHARY T 07/15/2013 8:19 AM

## 2013-07-15 NOTE — IPOC Note (Addendum)
Overall Plan of Care [facility]) Patient Details Name: Eddie Rocha MRN: [id number] DOB: 1965/09/27  Admitting Diagnosis: SPINAL CORD COMPRESSION WITH MYELOPATHY  Hospital Problems: Active Problems:   SCI (spinal cord injury)     Functional Problem List: Nursing Bladder;Bowel;Medication Management;Pain;Safety;Skin Integrity  PT Balance;Endurance;Motor;Pain;Sensory;Skin Integrity  OT Balance;Motor;Endurance;Pain;Perception;Safety;Sensory;Skin Integrity  SLP    TR  activity tolerance, pain, safety       Basic ADL's: OT Grooming;Bathing;Dressing;Toileting     Advanced  ADL's: OT Simple Meal Preparation;Light Housekeeping     Transfers: PT Bed Mobility;Bed to Chair;Car;Furniture  OT Toilet;Tub/Shower     Locomotion: PT Ambulation;Stairs     Additional Impairments: OT None  SLP        TR      Anticipated Outcomes Item Anticipated Outcome  Self Feeding independent (current level)  Swallowing      Basic self-care  mod I  Toileting  mod I   Bathroom Transfers mod I  Bowel/Bladder  cont of bowel and bladder  Transfers  mod I basic; S car  Locomotion  mod I household gait; S/min A stairs  Communication     Cognition     Pain  less than 3  Safety/Judgment  adhere to safety protocol   Therapy Plan: PT Intensity: Minimum of 1-2 x/day ,45 to 90 minutes PT Frequency: 5 out of 7 days PT Duration Estimated Length of Stay: 7-10 days OT Intensity: Minimum of 1-2 x/day, 45 to 90 minutes OT Frequency: 5 out of 7 days OT Duration/Estimated Length of Stay: 7-10 days         Team Interventions: Nursing Interventions Bladder Management;Patient/Family Education;Bowel Management;Pain Management;Medication Management;Discharge Planning  PT interventions Ambulation/gait training;Balance/vestibular training;Community reintegration;Discharge planning;Disease management/prevention;DME/adaptive equipment instruction;Functional mobility training;Neuromuscular  re-education;Pain management;Patient/family education;Psychosocial support;Skin care/wound management;Splinting/orthotics;Stair training;Therapeutic Activities;Therapeutic Exercise;UE/LE Strength taining/ROM;UE/LE Coordination activities;Wheelchair propulsion/positioning  OT Interventions Balance/vestibular training;Community reintegration;Discharge planning;DME/adaptive equipment instruction;Functional mobility training;Neuromuscular re-education;Pain management;Patient/family education;Psychosocial support;Self Care/advanced ADL retraining;Skin care/wound managment;Therapeutic Activities;Therapeutic Exercise;UE/LE Strength taining/ROM;UE/LE Coordination activities  SLP Interventions    TR Interventions  recreation/leisure participation, therapeutic activities, balance activities, community reintegration, pt/family education, discharge planning, psychosocial support, adaptive equipment instruction/education,functional mobility  SW/CM Interventions Discharge Planning;Psychosocial Support;Patient/Family Education    Team Discharge Planning: Destination: PT-Home ,OT- Home , SLP-  Projected Follow-up: PT-Home health PT, OT-  Home health OT, SLP-  Projected Equipment Needs: PT-Rolling walker with 5" wheels, OT- 3 in 1 bedside comode;Tub/shower bench, SLP-  Equipment Details: PT- , OT-  Patient/family involved in discharge planning: PT- Patient,  OT-Patient, SLP-   MD ELOS: 7-10 days Medical Rehab Prognosis:  Excellent Assessment: The patient has been admitted for CIR therapies. The team will be addressing, functional mobility, strength, stamina, balance, safety, adaptive techniques/equipment, self-care, bowel and bladder mgt, patient and caregiver education, NMR, fxnl mobility, pain mgt, wound care. Goals have been set at [place] Lope, MD, [facility]     See Team Conference Notes for weekly updates to the plan of care

## 2013-07-15 NOTE — Plan of Care (Signed)
Problem: RH PAIN MANAGEMENT Goal: RH STG PAIN MANAGED AT OR BELOW PT'S PAIN GOAL Less than 3  Outcome: Not Progressing Rates at 7/10

## 2013-07-15 NOTE — Progress Notes (Signed)
Occupational Therapy Session Notes  Patient Details  Name: Eddie Rocha MRN: [id number] Date of Birth: 12/30/1965  Today's Date: 07/15/2013  Short Term Goals: Week 1:  OT Short Term Goal 1 (Week 1): Short Term Goals = Long Term Goals  Skilled Therapeutic Interventions/Progress Updates:   Session #1 [id number] - 44 Minutes Individual Therapy Patient with 10/10 complaints of pain, patient stated he already had pain meds Upon entering room, patient found supine in bed. Patient engaged in bed mobility, then stood with RW and ambulated into bathroom for shower stall transfer. Patient completed UB/LB bathing at a distant supervision level; patient is very private and requests/demands privacy. Therapist and patient discussed back precautions and therapist reminded patient of their importance.  From here patient dried with towels and donned pants and shoes. Patient donned hospital gown secondary to dirty shirt. Patient ambulated out of shower and stood at sink for grooming tasks of brushing teeth and washing face. Patient tends to furniture walk, therapist discouraged this and encouraged hands on walker at all times. Patient sat in recliner and therapist left all needed items within reach.   Session #2 [id number] - 89 Minutes Individual Therapy Patient with 8/10 complaints of pain; RN made aware Patient found supine in bed with sister, Tamela Oddi present. Patient engaged in bed mobility and sat edge of bed in order to doff pants, donn underwear, and donn clean pants. Patient transferred to w/c and attempted to donn shirt standing; therapist encouraged & recommended patient donn shirt in seated position for safety. Patient then engaged in grooming task of shaving in seated position (therapist encouraged standing to work on dynamic standing, but secondary to pain patient unable to stand at this time). After grooming tasks that took more than reasonable amount of time, RN present for medication management.  Patient then engaged in functional ambulation using rolling walker around unit on various surfaces. At end of session, left patient supine in bed with all needed items within reach.   Precautions:  Precautions Precautions: Back Precaution Comments: Able to recall 2/3 back precautions Restrictions Weight Bearing Restrictions: No  See FIM for current functional status  Aleila Syverson 07/15/2013, 7:18 AM
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
## 2013-07-16 ENCOUNTER — Inpatient Hospital Stay (HOSPITAL_COMMUNITY): Payer: Medicaid Other | Admitting: Physical Therapy

## 2013-07-16 ENCOUNTER — Inpatient Hospital Stay (HOSPITAL_COMMUNITY): Payer: Self-pay | Admitting: Physical Therapy

## 2013-07-16 ENCOUNTER — Inpatient Hospital Stay (HOSPITAL_COMMUNITY): Payer: Self-pay | Admitting: Occupational Therapy

## 2013-07-16 ENCOUNTER — Encounter (HOSPITAL_COMMUNITY): Payer: Self-pay | Admitting: Occupational Therapy

## 2013-07-16 ENCOUNTER — Inpatient Hospital Stay (HOSPITAL_COMMUNITY): Payer: Self-pay

## 2013-07-16 DIAGNOSIS — G959 Disease of spinal cord, unspecified: Secondary | ICD-10-CM

## 2013-07-16 DIAGNOSIS — IMO0002 Reserved for concepts with insufficient information to code with codable children: Secondary | ICD-10-CM

## 2013-07-16 NOTE — Progress Notes (Signed)
Occupational Therapy Session Note  Patient Details  Name: Eddie Rocha MRN: [id number] Date of Birth: [birth year]  Today's Date: 07/16/2013 Time: 1962-2297 Time Calculation (min): 38 min  Short Term Goals: Week 1:  OT Short Term Goal 1 (Week 1): Short Term Goals = Long Term Goals  Skilled Therapeutic Interventions/Progress Updates:    1:1 Pt declined to participate in bathing and dressing; reported he just wanted to walk. Pt came to EOB with supervision but required mod cuing to maintaining back precautions. Pt was handed his shoes; pt reported "I can do it; I have my own ways of doing things." shoes returned to floor and then pt bent down and picked them up breaking his back precautions - continued education requarding performing simple task while maintaining back precautions. Pt ambulated to bathroom with decr safety with RW but would not accept education or suggestion. Ambulated around IP rehab units (4W and 56M) with focus on RW safety, monitoring step length for more control. In gym sitting on elevated mat focused on coordinated/ controlled bilateral LE movement - drawing letters in the air (open chained activity) with focus on core stability and LE coordination. Also performed standing static and dynamic standing balance with alternating stepping onto block. Pt required min to mod A with cues to slow movement/ speed down to perform. Pt returned to bed after session.   Therapy Documentation Precautions:  Precautions Precautions: Back Precaution Comments: Able to recall 2/3 back precautions Restrictions Weight Bearing Restrictions: No General: General Amount of Missed OT Time (min): 7 Minutes due to pain in back  Pain: 8/10 RN made aware and brought meds See FIM for current functional status  Therapy/Group: Individual Therapy  Willeen Cass [facility] 07/16/2013, 10:26 AM

## 2013-07-16 NOTE — Progress Notes (Signed)
Physical Therapy Session Note  Patient Details  Name: Eddie Rocha MRN: [id number] Date of Birth: August 25, 1965  Today's Date: 07/16/2013 Time: 6834-1962 Time Calculation (min): 45 min  Short Term Goals: Week 1:  PT Short Term Goal 1 (Week 1): = LTGs  Therapy Documentation Precautions:  Precautions: Spinal/Back Precaution Comments: Able to recall 2/3 back precautions Weight Bearing Restrictions: No Pain: Pain Assessment: 0-10 Pain Score: 10-Worst pain ever Pain Type: Surgical pain Pain Location: Back Pain Orientation: Upper;Mid Pain Descriptors / Indicators: Constant Pain Frequency: Intermittent Pain Onset: With Activity Patients Stated Pain Goal: 2 Pain Intervention(s): Medication (See eMAR)  Therapeutic Activity:(15') bed mobility Independent, Transfers S/Mod-I, Dynamic sitting balance Independent at EOB donning shoes  Gait Training:(15') Using RW 2 x 300' S/Mod-I. Gait with step-through gait with ataxia, uncoordinated/uncontrolled knee extension and occasionally kicking RW back leg on R > L. Therapeutic Exercise:(15') Nu-Step x 10' at Level 9 with LE's only, and needing to reposition L foot 4x onto foot plate due to slipping downward.   Therapy/Group: Individual Therapy  Clearence Ped 07/16/2013, 4:22 PM

## 2013-07-16 NOTE — Progress Notes (Signed)
Eddie Rocha is a 48 y.o. male [birth year] [id number]  Subjective:  C/o lower jaw hardware pain - not bad (installed at [place])  Slept well. Feeling OK.  Objective: Vital signs in last 24 hours: Temp:  [97.8 F (36.6 C)] 97.8 F (36.6 C) (01/10 0347) Pulse Rate:  [78] 78 (01/10 0633) Resp:  [19] 19 (01/10 0633) BP: (104)/(65) 104/65 mmHg (01/10 0633) SpO2:  [99 %] 99 % (01/10 4259) Weight change:  Last BM Date: 07/14/13  Intake/Output from previous day: 01/09 0701 - 01/10 0700 In: 600 [P.O.:600] Out: 500 [Urine:500] Last cbgs: CBG (last 3)  No results found for this basename: GLUCAP,  in the last 72 hours   Physical Exam General: No apparent distress    HEENT: moist mucosa Lungs: Normal effort. Lungs clear to auscultation, no crackles or wheezes. Cardiovascular: Regular rate and rhythm, no edema Abdomen: S/NT/ND; BS(+) Musculoskeletal:  No change from before Neurological: No new neurological deficits Wounds: N/A    Skin: clear Alert, cooperative   Lab Results: BMET    Component Value Date/Time   NA 137 07/13/2013 0500   K 4.7 07/13/2013 0500   CL 96 07/13/2013 0500   CO2 25 07/13/2013 0500   GLUCOSE 91 07/13/2013 0500   BUN 21 07/13/2013 0500   CREATININE 1.00 07/13/2013 0500   CALCIUM 9.8 07/13/2013 0500   GFRNONAA 87* 07/13/2013 0500   GFRAA >90 07/13/2013 0500   CBC    Component Value Date/Time   WBC 5.9 07/13/2013 0500   RBC 4.18* 07/13/2013 0500   HGB 13.3 07/13/2013 0500   HCT 39.9 07/13/2013 0500   PLT 306 07/13/2013 0500   MCV 95.5 07/13/2013 0500   MCH 31.8 07/13/2013 0500   MCHC 33.3 07/13/2013 0500   RDW 13.6 07/13/2013 0500   LYMPHSABS 1.6 07/13/2013 0500   MONOABS 0.7 07/13/2013 0500   EOSABS 0.6 07/13/2013 0500   BASOSABS 0.0 07/13/2013 0500    Studies/Results: No results found.  Medications: I have reviewed the patient's current medications.  Assessment/Plan:  1. DVT Prophylaxis/Anticoagulation: Pharmaceutical: Lovenox  2. Pain Management: prn medications  effective thus far.  3. Mood: Has positive outlook. Will have LCSW follow for evaluation.  4. Neuropsych: This patient is capable of making decisions on his own behalf.  5. Spasticity: baclofen prn for now. Follow for efficacy. 6. SCI with cord compression 7. Lower jaw hardware pain (installed at [facility]). May need an Oral Surgery consult next week     Length of stay, days: [place] , MD 07/16/2013, 9:08 AM

## 2013-07-16 NOTE — Progress Notes (Signed)
Physical Therapy Session Note  Patient Details  Name: Eddie Rocha MRN: [id number] Date of Birth: March 26, 1966  Today's Date: 07/16/2013 Time: 1031-1120 Time Calculation (min): 49 min  Short Term Goals: Week 1:  PT Short Term Goal 1 (Week 1): = LTGs  Skilled Therapeutic Interventions/Progress Updates:   Pt received in bed reporting "I just came back from therapy, I have to go again? My pain is the worst." Pt does report receiving pain medication just prior to therapy. After discussion with pt regarding importance of mobility, pt agreeable to PT. Pt ambulated in hallways 165 feet x2 with RW and close supervision. Pt requires verbal cues to promote decreased stride length of bilateral lower extremities, heel strike, toe off, and decreased toe out of bilateral lower extremities. Pt presents with overall decreased safety awareness, reporting "I just want to get  It done so I can go back and lay down." Pt negotiated up/down 15 steps using bilateral railings at close supervision with cues for safety and foot placement. Pt completed the nu-step x10 min, level 9 to promote increased strength and ROM. Pt reports he enjoys that machine, "makes me feel like I have done something,." Pt instructed in balance training activities and negotiation of obstacles, through an obstacles course, including the curb, stepping over objects, ambulating on uneven terrain, and weaving through cones (fig 8 pattern). Pt completed course x2 reps prior to requested a 2 min rest break. Pt noted to be resting in supine but up on his elbows on the mat table. Pt demonstrated poor safety awareness throughout session and unable to maintain back precautions, as pt twisted during ambulation to attempt to talk to someone walking down the hall. Pt returned to room per request as pt reported intolerable pain throughout session. Pt supine in bed, with bed exit on and all needs within reach.   Therapy Documentation Precautions:   Precautions Precautions: Back Precaution Comments: Able to recall 2/3 back precautions Restrictions Weight Bearing Restrictions: No General: Amount of Missed PT Time (min): 11 Minutes Missed Time Reason: Pain    Pain: Pain Assessment Pain Score: 2  Pain Location: Back Pain Orientation: Mid;Upper Pain Descriptors / Indicators: Aching;Constant Pain Onset: With Activity Patients Stated Pain Goal: 1 Pain Intervention(s): Repositioned;Emotional support;Other (Comment) (Pt reports receiving pain medicaiton prior to therapy session. ) See FIM for current functional status  Therapy/Group: Individual Therapy  Malyn Aytes R 07/16/2013, 11:50 AM

## 2013-07-16 NOTE — Progress Notes (Signed)
Occupational Therapy Note  Patient Details  Name: Eddie Rocha MRN: [id number] Date of Birth: 1965/10/11 Today's Date: 07/16/2013  Pt missed 45 min of therapy due to fatigue.  Willeen Cass [facility] 07/16/2013, 2:58 PM

## 2013-07-17 ENCOUNTER — Inpatient Hospital Stay (HOSPITAL_COMMUNITY): Payer: Self-pay | Admitting: *Deleted

## 2013-07-17 MED ORDER — CLONAZEPAM 0.5 MG PO TABS
0.5000 mg | ORAL_TABLET | Freq: Every evening | ORAL | Status: DC | PRN
  Administered 2013-07-17 – 2013-07-19 (×3): 0.5 mg via ORAL
  Filled 2013-07-17 (×3): qty 1

## 2013-07-17 NOTE — Plan of Care (Signed)
Problem: RH PAIN MANAGEMENT Goal: RH STG PAIN MANAGED AT OR BELOW PT'S PAIN GOAL Less than 7  Outcome: Not Progressing Pain level 10/10 on the pain scale, request Oxy IR Q4hours

## 2013-07-17 NOTE — Progress Notes (Signed)
Occupational Therapy Note  Patient Details  Name: Eddie Rocha MRN: [id number] Date of Birth: [birth year] Today's Date: 07/17/2013   Pt. Missed 60 minutes of therapy due to pain in back 10/10. Pt. Sleeping upon OT arrival, but easily aroused.   Nurse just administered pain meds.  Pt. Complained of constipation, but did not want to engage in toileting  Or any other activity.    [name] 07/17/2013, 3:38 PM

## 2013-07-17 NOTE — Progress Notes (Signed)
Eddie Rocha is a 48 y.o. male May 05, 1966 [id number]  Subjective:  C/o lower jaw hardware pain - not bad (installed at [facility]) C/o insomnia. Feeling OK.  Objective: Vital signs in last 24 hours: Temp:  [97.1 F (36.2 C)-98 F (36.7 C)] 98 F (36.7 C) (01/11 0431) Pulse Rate:  [66-88] 66 (01/11 0431) Resp:  [17-18] 17 (01/11 0431) BP: (97-120)/(59-72) 97/59 mmHg (01/11 0431) SpO2:  [97 %-99 %] 97 % (01/11 0431) Weight change:  Last BM Date: 07/16/13  Intake/Output from previous day: 01/10 0701 - 01/11 0700 In: 1080 [P.O.:1080] Out: 450 [Urine:450] Last cbgs: CBG (last 3)  No results found for this basename: GLUCAP,  in the last 72 hours   Physical Exam General: No apparent distress    HEENT: moist mucosa Lungs: Normal effort. Lungs clear to auscultation, no crackles or wheezes. Cardiovascular: Regular rate and rhythm, no edema Abdomen: S/NT/ND; BS(+) Musculoskeletal:  No change from before Neurological: No new neurological deficits Wounds: N/A    Skin: clear Alert, cooperative   Lab Results: BMET    Component Value Date/Time   NA 137 07/13/2013 0500   K 4.7 07/13/2013 0500   CL 96 07/13/2013 0500   CO2 25 07/13/2013 0500   GLUCOSE 91 07/13/2013 0500   BUN 21 07/13/2013 0500   CREATININE 1.00 07/13/2013 0500   CALCIUM 9.8 07/13/2013 0500   GFRNONAA 87* 07/13/2013 0500   GFRAA >90 07/13/2013 0500   CBC    Component Value Date/Time   WBC 5.9 07/13/2013 0500   RBC 4.18* 07/13/2013 0500   HGB 13.3 07/13/2013 0500   HCT 39.9 07/13/2013 0500   PLT 306 07/13/2013 0500   MCV 95.5 07/13/2013 0500   MCH 31.8 07/13/2013 0500   MCHC 33.3 07/13/2013 0500   RDW 13.6 07/13/2013 0500   LYMPHSABS 1.6 07/13/2013 0500   MONOABS 0.7 07/13/2013 0500   EOSABS 0.6 07/13/2013 0500   BASOSABS 0.0 07/13/2013 0500    Studies/Results: No results found.  Medications: I have reviewed the patient's current medications.  Assessment/Plan:  1. DVT Prophylaxis/Anticoagulation: Pharmaceutical: Lovenox  2. Pain  Management: prn medications effective thus far.  3. Mood: Has positive outlook. Will have LCSW follow for evaluation.  4. Neuropsych: This patient is capable of making decisions on his own behalf.  5. Spasticity: baclofen prn for now. Follow for efficacy. 6. SCI with cord compression 7. Lower jaw hardware pain (installed at [facility]). May need an Oral Surgery consult next week 8. Insomnia - see meds     Length of stay, days: 5  Walker Kehr , MD 07/17/2013, 9:44 AM

## 2013-07-18 ENCOUNTER — Inpatient Hospital Stay (HOSPITAL_COMMUNITY): Payer: Self-pay

## 2013-07-18 ENCOUNTER — Inpatient Hospital Stay (HOSPITAL_COMMUNITY): Payer: Medicaid Other | Admitting: Occupational Therapy

## 2013-07-18 DIAGNOSIS — M5104 Intervertebral disc disorders with myelopathy, thoracic region: Secondary | ICD-10-CM

## 2013-07-18 NOTE — Progress Notes (Signed)
Subjective/Complaints: Sleeping in this morning. Uneventful weekend. Motivation an issue at times. A 12 point review of systems has been performed and if not noted above is otherwise negative.   Objective: Vital Signs: Blood pressure 94/61, pulse 70, temperature 97.6 F (36.4 C), temperature source Oral, resp. rate 16, height 5' 8.11" (1.73 m), weight 63 kg (138 lb 14.2 oz), SpO2 99.00%. No results found. No results found for this basename: WBC, HGB, HCT, PLT,  in the last 72 hours No results found for this basename: NA, K, CL, CO, GLUCOSE, BUN, CREATININE, CALCIUM,  in the last 72 hours CBG (last 3)  No results found for this basename: GLUCAP,  in the last 72 hours  Wt Readings from Last 3 Encounters:  07/12/13 63 kg (138 lb 14.2 oz)  07/07/13 63.05 kg (139 lb)  07/07/13 63.05 kg (139 lb)    Physical Exam:  Constitutional: He is oriented to person, place, and time. He appears well-developed and well-nourished.  HENT:  Head: Normocephalic and atraumatic.  Eyes: Conjunctivae are normal. Pupils are equal, round, and reactive to light.  Neck: Normal range of motion. Neck supple.  Cardiovascular: Normal rate and regular rhythm.  No murmur heard.  Respiratory: Effort normal and breath sounds normal. No respiratory distress. He has no wheezes.  GI: Soft. Bowel sounds are normal. He exhibits no distension. There is no tenderness.  Musculoskeletal: He exhibits no edema and no tenderness.  Neurological: He is alert and oriented to person, place, and time.    Decreased PP and LT below the umbilicus (1/2). LE grossly 3+ to 4-/5 proximal to distal. DTR's trace. Still no resting tone. UE's normal  Skin: Skin is warm and dry.    thoracic incision with skin glue--clean, dry and intact .  Psychiatric: He has a normal mood and affect. His behavior is normal. Slow to arouse again this am   Assessment/Plan: 1. 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 Physiatrist is providing close team supervision and 24 hour management of active medical problems listed below. Physiatrist and rehab team continue to assess barriers to discharge/monitor patient progress toward functional and medical goals. FIM: FIM - Bathing Bathing Steps Patient Completed: Chest;Right Arm;Left Arm;Abdomen;Front perineal area;Buttocks;Right upper leg;Left upper leg;Right lower leg (including foot);Left lower leg (including foot) Bathing: 5: Supervision: Safety issues/verbal cues  FIM - Upper Body Dressing/Undressing Upper body dressing/undressing steps patient completed: Thread/unthread right sleeve of pullover shirt/dresss;Thread/unthread left sleeve of pullover shirt/dress;Put head through opening of pull over shirt/dress;Pull shirt over trunk Upper body dressing/undressing: 7: Complete Independence: No helper FIM - Lower Body Dressing/Undressing Lower body dressing/undressing steps patient completed: Thread/unthread right underwear leg;Thread/unthread left underwear leg;Pull underwear up/down;Thread/unthread right pants leg;Thread/unthread left pants leg;Pull pants up/down;Don/Doff right shoe;Don/Doff left shoe;Fasten/unfasten right shoe;Fasten/unfasten left shoe Lower body dressing/undressing:  (supervision to maintain back precautions)  FIM - Toileting Toileting steps completed by patient: Adjust clothing prior to toileting;Performs perineal hygiene;Adjust clothing after toileting Toileting: 5: Supervision: Safety issues/verbal cues  FIM - Air cabin crew Transfers Assistive Devices: Walker;Grab bars Toilet Transfers: 5-To toilet/BSC: Supervision (verbal cues/safety issues);5-From toilet/BSC: Supervision (verbal cues/safety issues)  FIM - Control and instrumentation engineer Devices: Copy: 5: Supine > Sit: Supervision (verbal cues/safety  issues);5: Sit > Supine: Supervision (verbal cues/safety issues);5: Bed > Chair or W/C: Supervision (verbal cues/safety issues);5: Chair or W/C > Bed: Supervision (verbal cues/safety issues)  FIM - Locomotion:  Wheelchair Locomotion: Wheelchair: 0: Activity did not occur FIM - Locomotion: Ambulation Locomotion: Ambulation Assistive Devices: Administrator Ambulation/Gait Assistance: 5: Supervision Locomotion: Ambulation: 5: Travels 150 ft or more with supervision/safety issues  Comprehension Comprehension Mode: Auditory Comprehension: 5-Follows basic conversation/direction: With no assist  Expression Expression Mode: Verbal Expression: 5-Expresses basic needs/ideas: With no assist  Social Interaction Social Interaction: 5-Interacts appropriately 90% of the time - Needs monitoring or encouragement for participation or interaction.  Problem Solving Problem Solving: 5-Solves basic problems: With no assist  Memory Memory: 5-Recognizes or recalls 90% of the time/requires cueing < 10% of the time  Medical Problem List and Plan:  SCI with cord compression  1. DVT Prophylaxis/Anticoagulation: Pharmaceutical: Lovenox  2. Pain Management: prn medications effective thus far. 3. Mood: motivation an issue at times. Will discuss with patient. Overall has been doing fairly well 4. Neuropsych: This patient is capable of making decisions on his own behalf.  5. Spasticity:   baclofen prn has been helpful. 6. Jaw pain--watch for now (a lot of other mild somatic complaints noted over weekend)  LOS (Days) 6 A FACE TO FACE EVALUATION WAS PERFORMED  SWARTZ,ZACHARY T 07/18/2013 8:17 AM

## 2013-07-18 NOTE — Progress Notes (Signed)
Physical Therapy Session Note  Patient Details  Name: Eddie Rocha MRN: [id number] Date of Birth: May 27, 1966  Today's Date: 07/18/2013 Time: 9211-9417 Time Calculation (min): 55 min  Short Term Goals: Week 1:  PT Short Term Goal 1 (Week 1): = LTGs  Skilled Therapeutic Interventions/Progress Updates:    [place] with Recreational therapy with session focusing on neuro re-ed for dynamic standing balance and coordination (ball toss, alternating kicking ball, and then switching between the two), dynamic gait through obstacle course to simulate community environment with close S, what to do in case of a fall and practiced floor transfer while maintaining back precautions with close S with pt repeating back appropriate steps, stair negotiation with S, and gait training on carpeted surface with and without AD (S with RW and min A without A).   Therapy Documentation Precautions:  Precautions Precautions: Back Precaution Comments: Able to recall 2/3 back precautions Restrictions Weight Bearing Restrictions: No Pain:  c/o 10/10 back pain - premedicated.  See FIM for current functional status  Therapy/Group: Individual Therapy  [name] [facility] 07/18/2013, 12:12 PM

## 2013-07-18 NOTE — Progress Notes (Signed)
Recreational Therapy Assessment and Plan  Patient Details  Name: Eddie Rocha MRN: [id number] Date of Birth: 20-Aug-1965 Today's Date: 07/18/2013  Rehab Potential: Good ELOS: 10-14 days   Assessment Clinical Impression:Problem List:  Patient Active Problem List    Diagnosis  Date Noted   .  SCI (spinal cord injury)  07/12/2013   .  Spinal cord compression  07/07/2013    Past Medical History:  Past Medical History   Diagnosis  Date   .  Hernia     Past Surgical History:  Past Surgical History   Procedure  Laterality  Date   .  Mandible fracture surgery     .  Hernia repair     .  Laminectomy   07/06/2013     T9 T10 CORD COMPRESSION   .  Lumbar laminectomy/decompression microdiscectomy  N/A  07/06/2013     Procedure: LUMBAR LAMINECTOMY, DECOMPRESSION T9 to T10; Surgeon: Consuella Lose, MD; Location: [facility]; Service: Neurosurgery; Laterality: N/A;    Assessment & Plan  Clinical Impression: Patient is a 48 y.o. year old male with recent admission to the hospital on 07/06/13 with complaints of back pain and BLE weakness with inability to walk. Patient reported that he had been wrestling with family member 5 days PTA with onset of pain and weakness with LE numbness. MRI spine with compressive lesion T9-T10 with edema and underlying thoracic disc herniation. He was taken to OR 07/07/13 for decompression of spinal cord by Dr. Kathyrn Sheriff. He continues with paraparesis as well as back pain. Patient transferred to CIR on 07/12/2013.    Pt presents with decreased activity tolerance, decreased functional mobility, decreased balance, decreased coordination, decreased safety, difficulty maintaining precautions limiting pt's independence with leisure/community pursuits.  Leisure History/Participation Premorbid leisure interest/current participation: [place] care;Crafts - Other (Comment);Community - Doctor, hospital - Building control surveyor - Travel (Comment) (handy  man) Other Leisure Interests: Television;Movies Leisure Participation Style: Alone;With Family/Friends Awareness of Community Resources: Fair-identify 2 post discharge leisure resources Psychosocial / Spiritual Social interaction - Mood/Behavior: Cooperative Academic librarian Appropriate for Education?: Yes Strengths/Weaknesses Patient Strengths/Abilities: Willingness to participate;Active premorbidly Patient weaknesses: Physical limitations TR Patient demonstrates impairments in the following area(s): Endurance;Motor;Pain;Safety TR Additional Impairment(s): None  Plan Rec Therapy Plan Is patient appropriate for Therapeutic Recreation?: Yes Rehab Potential: Good Treatment times per week: Min 1 time per week >20 minutes Estimated Length of Stay: 10-14 days TR Treatment/Interventions: Adaptive equipment instruction;1:1 session;Balance/vestibular training;Functional mobility training;Community reintegration;Patient/family education;Therapeutic activities;Recreation/leisure participation;Therapeutic exercise;UE/LE Coordination activities  Recommendations for other services: None  Discharge Criteria: Patient will be discharged from TR if patient refuses treatment 3 consecutive times without medical reason.  If treatment goals not met, if there is a change in medical status, if patient makes no progress towards goals or if patient is discharged from hospital.  The above assessment, treatment plan, treatment alternatives and goals were discussed and mutually agreed upon: by patient  [place] 07/18/2013, 4:35 PM

## 2013-07-18 NOTE — Progress Notes (Signed)
Occupational Therapy Session Notes  Patient Details  Name: Eddie Rocha MRN: [id number] Date of Birth: Feb 05, 1966  Today's Date: 07/18/2013  Short Term Goals: Week 1:  OT Short Term Goal 1 (Week 1): Short Term Goals = Long Term Goals  Skilled Therapeutic Interventions/Progress Updates:   Session #1 [phone] - 67 Minutes Missed 15 Minutes of skilled therapy secondary to pain, patient unwilling to participate at end of session Individual Therapy Patient with 8/10 pain in back; RN made aware Upon entering room, patient found supine in bed asleep. Patient easy to awake and arouse. Patient sat up in bed and stated "I need to wake up". Patient also stated he wanted to complete bathing after his exercises, "can I take a shower after 1:00?". Therapist explained that patient doesn't have any therapy after 1:00 and stated that tomorrow we could schedule him to work with OT at end of the day for bathing task. Patient sat edge of bed to get dressed and asked therapist for a reacher. OT went to get him one to use in room. When therapist came back, patient had already gathered his clothes. When therapist asked how he did it, patient stated "I used my foot". Therapist educated patient on importance of back precautions. From here, patient ambulated into bathroom at distant supervision level and completed toileting tasks at distant supervision level. Patient with increased agitation this am, secondary to pain? Patient then stood at sink to complete grooming task of brushing teeth and shaving, patient placed RW behind him and attempted to sit on bar; patient realized that this was unsafe without any cues or education from therapist. Therapist did ask patient if he wanted to sit in w/c after this, patient refused. From here, patient transferred to w/c and propelled self > tub room and performed tub/shower transfer on/off tub transfer bench. Patient states he does not want a shower seat or bench, therapist  recommends one for safety and encouraged patient to use one at home. Patient stepped out of shower (over tub threshold) with supervision. Therapist encouraged/recommended patient have supervision at home for in/out of shower; patient stated he understood. Patient propelled self back to room and transferred into recliner. Patient forgot to lock w/c brakes, therapist educated patient on importance of locking brakes. Then patient sat in recliner and broke back precautions in order to lock recliner brakes. Therapist continuously gives patient cues for back precautions and overall safety with functional mobility with transfers and while using RW. Patient left patient seated in recliner with all needed items within reach.   Session #2 Patient missed 45 minutes of skilled OT intervention secondary to refusal. Patient stated he was in pain and "I want to rest". Therapist encouraged patient and suggested bathing secondary to exercise this am (per his request to bath after exercise), but patient refused stating "I can clean up later". Even after encouragement, patient still refused. Therapist notified RN of missed time and increased pain.   Precautions:  Precautions Precautions: Back Precaution Comments: Able to recall 2/3 back precautions Restrictions Weight Bearing Restrictions: No  See FIM for current functional status  [place] Han[place] 07/18/2013, 7:22 AM

## 2013-07-18 NOTE — Progress Notes (Signed)
Physical Therapy Session Note  Patient Details  Name: Eddie Rocha MRN: [id number] Date of Birth: 02-23-1966  Today's Date: 07/18/2013 Time: 1301-1330 Time Calculation (min): 29 min  Short Term Goals: Week 1:  PT Short Term Goal 1 (Week 1): = LTGs  Skilled Therapeutic Interventions/Progress Updates:    Session focused on gait training with and without RW (min to mod A without AD and S with RW) with focus on step length, posture, and safety with RW and functional strengthening on Kinetron in standing 2 min intervals with limited UE support.   Therapy Documentation Precautions:  Precautions Precautions: Back Precaution Comments: Able to recall 2/3 back precautions Restrictions Weight Bearing Restrictions: No Pain:  Premedicated - c/o back pain. Locomotion : Ambulation Ambulation/Gait Assistance: 5: Supervision   See FIM for current functional status  Therapy/Group: Individual Therapy  [name] [facility] 07/18/2013, 1:30 PM

## 2013-07-19 ENCOUNTER — Inpatient Hospital Stay (HOSPITAL_COMMUNITY): Payer: Medicaid Other | Admitting: Occupational Therapy

## 2013-07-19 ENCOUNTER — Inpatient Hospital Stay (HOSPITAL_COMMUNITY): Payer: Self-pay

## 2013-07-19 ENCOUNTER — Inpatient Hospital Stay (HOSPITAL_COMMUNITY): Payer: Self-pay | Admitting: *Deleted

## 2013-07-19 DIAGNOSIS — M5104 Intervertebral disc disorders with myelopathy, thoracic region: Secondary | ICD-10-CM

## 2013-07-19 LAB — CREATININE, SERUM
CREATININE: 0.94 mg/dL (ref 0.50–1.35)
GFR calc non Af Amer: >90 mL/min (ref >90)

## 2013-07-19 MED ORDER — GABAPENTIN 100 MG PO CAPS
100.0000 mg | ORAL_CAPSULE | Freq: Three times a day (TID) | ORAL | Status: DC
  Administered 2013-07-19 – 2013-07-20 (×4): 100 mg via ORAL
  Filled 2013-07-19 (×7): qty 1

## 2013-07-19 NOTE — Progress Notes (Signed)
Occupational Therapy Session Notes  Patient Details  Name: Eddie Rocha MRN: [id number] Date of Birth: 01-31-1966  Today's Date: 07/19/2013  Short Term Goals: Week 1:  OT Short Term Goal 1 (Week 1): Short Term Goals = Long Term Goals  Skilled Therapeutic Interventions/Progress Updates:   Session #1 Patient missed 45 minutes of skilled OT time secondary to refusal. Before entering room, patient notified nursing that he did not want therapist coming into his room and disturbing him. He told RN that he was not going to participate in 10:00 therapy session.   Session #2 [id number] - 66 Minutes Individual Therapy Patient with 10/10 complaints of pain in back; RN aware Patient found supine in bed. Patient initially refused, but after max encouragement patient willing to work with therapist. Therapist discussed soon d/c and possibly tomorrow, this was not a motivating factor for patient. Patient engaged in bed mobility at mod I level, sat edge of bed to donn shoes at mod I level using reacher, and stood with RW. Patient ambulated > therapy gym mod I and sat on NuStep machine for BLE strengthening. From here, patient engaged in functional ambulation using RW around/over an obstacle course. Patient then engaged in side stepping right<>left while using rolling walker. In functional setting, patient with difficult time with side stepping. Patient ambulated back to therapy gym and engaged in BUE strengthening using ergometer for ~10 minutes. Patient ambulated back to room and left seated edge of bed with all needed items within reach.   Precautions:  Precautions Precautions: Back Precaution Comments: Able to recall 2/3 back precautions Restrictions Weight Bearing Restrictions: No  See FIM for current functional status  Eddie Rocha 07/19/2013, 7:29 AM

## 2013-07-19 NOTE — Progress Notes (Signed)
Recreational Therapy Session Note  Patient Details  Name: Eddie Rocha MRN: [id number] Date of Birth: [birth year] Today's Date: 07/19/2013  Pain: c/o back pain- unrated, declined inquiring about pain medicine  Skilled Therapeutic Interventions/Progress Updates: Pt refused scheduled therapy session stating "just let me rest"   Pt stated he was just tired and wasn't going to do therapy.  Encouraged pt to participate in therapies this afternoon in which pt was agreeable.   [place] 07/19/2013, 11:15 AM

## 2013-07-19 NOTE — Progress Notes (Signed)
Subjective/Complaints: Mouth sore from hardware in teeth rubbing cheek. Feet/legs sensitive and burning. A 12 point review of systems has been performed and if not noted above is otherwise negative.   Objective: Vital Signs: Blood pressure 107/75, pulse 72, temperature 97.6 F (36.4 C), temperature source Oral, resp. rate 18, height 5' 8.11" (1.73 m), weight 63 kg (138 lb 14.2 oz), SpO2 97.00%. No results found. No results found for this basename: WBC, HGB, HCT, PLT,  in the last 72 hours  Recent Labs  07/19/13 0600  CREATININE 0.94   CBG (last 3)  No results found for this basename: GLUCAP,  in the last 72 hours  Wt Readings from Last 3 Encounters:  07/12/13 63 kg (138 lb 14.2 oz)  07/07/13 63.05 kg (139 lb)  07/07/13 63.05 kg (139 lb)    Physical Exam:  Constitutional: He is oriented to person, place, and time. He appears well-developed and well-nourished.  HENT: oral mucosa irritated along lower teeth, no frank ulceration Head: Normocephalic and atraumatic.  Eyes: Conjunctivae are normal. Pupils are equal, round, and reactive to light.  Neck: Normal range of motion. Neck supple.  Cardiovascular: Normal rate and regular rhythm.  No murmur heard.  Respiratory: Effort normal and breath sounds normal. No respiratory distress. He has no wheezes.  GI: Soft. Bowel sounds are normal. He exhibits no distension. There is no tenderness.  Musculoskeletal: He exhibits no edema and no tenderness.  Neurological: He is alert and oriented to person, place, and time.    Decreased PP and LT below the umbilicus (1/2). LE grossly 3+ to 4-/5 proximal to distal. DTR's trace. Still no resting tone. UE's normal  Skin: Skin is warm and dry.    thoracic incision with skin glue--clean, dry and intact .  Psychiatric: He has a normal mood and affect. His behavior is normal. Slow to arouse again this am   Assessment/Plan: 1. 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 Physiatrist is providing close team supervision and 24 hour management of active medical problems listed below. Physiatrist and rehab team continue to assess barriers to discharge/monitor patient progress toward functional and medical goals. FIM: FIM - Bathing Bathing Steps Patient Completed: Chest;Right Arm;Left Arm;Abdomen;Front perineal area;Buttocks;Right upper leg;Left upper leg;Right lower leg (including foot);Left lower leg (including foot) Bathing: 5: Supervision: Safety issues/verbal cues  FIM - Upper Body Dressing/Undressing Upper body dressing/undressing steps patient completed: Thread/unthread right sleeve of pullover shirt/dresss;Thread/unthread left sleeve of pullover shirt/dress;Put head through opening of pull over shirt/dress;Pull shirt over trunk Upper body dressing/undressing: 7: Complete Independence: No helper FIM - Lower Body Dressing/Undressing Lower body dressing/undressing steps patient completed: Thread/unthread right underwear leg;Thread/unthread left underwear leg;Pull underwear up/down;Thread/unthread right pants leg;Thread/unthread left pants leg;Pull pants up/down;Don/Doff right shoe;Don/Doff left shoe;Fasten/unfasten right shoe;Fasten/unfasten left shoe Lower body dressing/undressing:  (supervision to maintain back precautions)  FIM - Toileting Toileting steps completed by patient: Adjust clothing prior to toileting;Performs perineal hygiene;Adjust clothing after toileting Toileting: 5: Supervision: Safety issues/verbal cues  FIM - Air cabin crew Transfers Assistive Devices: Walker;Grab bars Toilet Transfers: 5-To toilet/BSC: Supervision (verbal cues/safety issues);5-From toilet/BSC: Supervision (verbal cues/safety issues)  FIM - Control and instrumentation engineer Devices: Copy: 5: Supine > Sit: Supervision (verbal  cues/safety issues);5: Sit > Supine: Supervision (verbal cues/safety issues);5: Bed > Chair or W/C: Supervision (verbal cues/safety issues);5: Chair or W/C > Bed: Supervision (verbal cues/safety issues)  FIM - Locomotion:  Wheelchair Locomotion: Wheelchair: 0: Activity did not occur FIM - Locomotion: Ambulation Locomotion: Ambulation Assistive Devices: Administrator Ambulation/Gait Assistance: 5: Supervision Locomotion: Ambulation: 5: Travels 150 ft or more with supervision/safety issues  Comprehension Comprehension Mode: Auditory Comprehension: 5-Follows basic conversation/direction: With extra time/assistive device  Expression Expression Mode: Verbal Expression: 5-Expresses basic needs/ideas: With extra time/assistive device  Social Interaction Social Interaction: 5-Interacts appropriately 90% of the time - Needs monitoring or encouragement for participation or interaction.  Problem Solving Problem Solving: 5-Solves basic problems: With no assist  Memory Memory: 5-Recognizes or recalls 90% of the time/requires cueing < 10% of the time  Medical Problem List and Plan:  SCI with cord compression  1. DVT Prophylaxis/Anticoagulation: Pharmaceutical: Lovenox  2. Pain Management: will add gabapentin 100mg  tid for leg pain 3. Mood: motivation an issue at times. Will discuss with patient. Overall has been doing fairly well 4. Neuropsych: This patient is capable of making decisions on his own behalf.  5. Spasticity:   baclofen prn has been helpful. 6. Jaw pain--lower set of wires causing irritation  -wax to hardware on teeth  -he sees [facility] for follow up in two weeks.  LOS (Days) 7 A FACE TO FACE EVALUATION WAS PERFORMED  Cathryne Mancebo T 07/19/2013 8:09 AM

## 2013-07-19 NOTE — Progress Notes (Addendum)
Nursing Note: Gave meds and pt ready for bed. Called and fussing because his bed alarm was on and stated".How do you turn this son of a bitch off?"Recieved report that pt turns it off and gets up w/o calling.ASked pt to call f he needed assistance as pt in bed.wbb   Addendum: Pt states he is for d/c tomm. And that he will be all right getting up alone.Verbalized that my concern was that he might fall ,pt still says "I'll be all right".wbb

## 2013-07-19 NOTE — Progress Notes (Signed)
Reviewed and in agreement with treatment provided.  

## 2013-07-19 NOTE — Progress Notes (Signed)
Physical Therapy Session Note  Patient Details  Name: Eddie Rocha MRN: [id number] Date of Birth: 01-Jan-1966  Today's Date: 07/19/2013 Time: 9357-0177  Time Calculation (min): 45 min  Short Term Goals: Week 1:  PT Short Term Goal 1 (Week 1): = LTGs  Skilled Therapeutic Interventions/Progress Updates:    Session focused on various gait training activities (stepping over cones while using RW and gait with and without the use of an AD). Pt able to perform gait with S with RW and with Min A without the use of a RW.  Pt climbed and descended steps during the session with the use of 1 handrail with occasional verbal cueing needed in order for pt to properly place feet on each step. Pt also performed a standing kicking task and reached for items on shelves in the kitchen to work on dynamic standing balance and coordination. Berg Balance Test was administered and discussed with patient.   Therapy Documentation Precautions:  Precautions Precautions: Back Precaution Comments: Able to recall 2/3 back precautions Restrictions Weight Bearing Restrictions: No Pain:  Pt has no c/o pain  Balance: Standardized Balance Assessment Standardized Balance Assessment: Berg Balance Test Berg Balance Test Sit to Stand: Able to stand without using hands and stabilize independently Standing Unsupported: Able to stand 2 minutes with supervision Sitting with Back Unsupported but Feet Supported on Floor or Stool: Able to sit safely and securely 2 minutes Stand to Sit: Controls descent by using hands Transfers: Able to transfer safely, definite need of hands Standing Unsupported with Eyes Closed: Able to stand 3 seconds Standing Ubsupported with Feet Together: Able to place feet together independently and stand for 1 minute with supervision Turn 360 Degrees: Needs close supervision or verbal cueing Standing Unsupported, Alternately Place Feet on Step/Stool: Able to complete >2 steps/needs minimal  assist Standing Unsupported, One Foot in Front: Able to place foot tandem independently and hold 30 seconds  See FIM for current functional status  Therapy/Group: Individual Therapy  Lolita Rieger 07/19/2013, 3:24 PM

## 2013-07-19 NOTE — Progress Notes (Signed)
Physical Therapy Session Note  Patient Details  Name: Eddie Rocha MRN: [id number] Date of Birth: [birth year]  Today's Date: 07/19/2013 Time: 8841-6606 Time Calculation (min): 48 min  Short Term Goals: Week 1:  PT Short Term Goal 1 (Week 1): = LTGs  Skilled Therapeutic Interventions/Progress Updates:    Session focused on gait training with RW with focus on step length and safety with AD, neuro re-ed for dynamic standing balance, coordination with targets on floor (without AD min to mod A), balance on compliant surface (mat and Bosu ball) without UE support for ball toss (required 1 UE support when on Bosu ball), alternating step ups onto Bosu ball for balance and strengthening x 10 reps each BLE, gait training in parallel bars without UE support forward, retro gait, and sidestepping x 3 reps each direction with intermittent min A, and Nustep for LE strengthening x 12 min on level 9 with LE's only.   Therapy Documentation Precautions:  Precautions Precautions: Back Precaution Comments: Able to recall 2/3 back precautions Restrictions Weight Bearing Restrictions: No  Locomotion : Ambulation Ambulation/Gait Assistance: 5: Supervision   See FIM for current functional status  Therapy/Group: Individual Therapy  [name] [facility] 07/19/2013, 9:28 AM

## 2013-07-20 ENCOUNTER — Encounter (HOSPITAL_COMMUNITY): Payer: Self-pay | Admitting: Occupational Therapy

## 2013-07-20 ENCOUNTER — Inpatient Hospital Stay (HOSPITAL_COMMUNITY): Payer: Self-pay

## 2013-07-20 DIAGNOSIS — M5104 Intervertebral disc disorders with myelopathy, thoracic region: Secondary | ICD-10-CM

## 2013-07-20 MED ORDER — ACETAMINOPHEN 325 MG PO TABS: 325.0000 mg | ORAL_TABLET | ORAL | Status: DC | PRN

## 2013-07-20 MED ORDER — TRAMADOL HCL 50 MG PO TABS: 50.0000 mg | ORAL_TABLET | Freq: Four times a day (QID) | ORAL | Status: DC | PRN

## 2013-07-20 MED ORDER — SENNOSIDES-DOCUSATE SODIUM 8.6-50 MG PO TABS: 1.0000 | ORAL_TABLET | Freq: Two times a day (BID) | ORAL | Status: DC

## 2013-07-20 MED ORDER — BACLOFEN 5 MG HALF TABLET
5.0000 mg | ORAL_TABLET | Freq: Two times a day (BID) | ORAL | Status: DC | PRN
Start: 2013-07-20 — End: 2013-08-09

## 2013-07-20 MED ORDER — OXYCODONE HCL 10 MG PO TABS: 5.0000 mg | ORAL_TABLET | Freq: Four times a day (QID) | ORAL | Status: DC | PRN

## 2013-07-20 MED ORDER — NICOTINE 21 MG/24HR TD PT24: 21.0000 mg | MEDICATED_PATCH | Freq: Every day | TRANSDERMAL | Status: DC

## 2013-07-20 MED ORDER — CLONAZEPAM 0.5 MG PO TABS: 0.5000 mg | ORAL_TABLET | Freq: Every evening | ORAL | Status: DC | PRN

## 2013-07-20 MED ORDER — GABAPENTIN 100 MG PO CAPS: 100.0000 mg | ORAL_CAPSULE | Freq: Three times a day (TID) | ORAL | Status: DC

## 2013-07-20 NOTE — Plan of Care (Signed)
Problem: RH SAFETY Goal: RH STG ADHERE TO SAFETY PRECAUTIONS W/ASSISTANCE/DEVICE STG Adhere to Safety Precautions With Assistance/Device. Supervision  Outcome: Progressing Pt gets up w/o staff assist despite reminders to call for assist.

## 2013-07-20 NOTE — Patient Care Conference (Signed)
Inpatient RehabilitationTeam Conference and Plan of Care Update Date: 07/19/2013   Time: 2:15 PM    Patient Name: Eddie Rocha      Medical Record Number: [id number]  Date of Birth: Jan 12, 1966 Sex: Male         Room/Bed: 4W25C/4W25C-01 Payor Info: Payor: MEDICAID PENDING / Plan: MEDICAID PENDING / Product Type: *No Product type* /    Admitting Diagnosis: SPINAL CORD COMPRESSION WITH MYELOPATHY  Admit Date/Time:  07/12/2013  4:11 PM Admission Comments: No comment available   Primary Diagnosis:  <principal problem not specified> Principal Problem: <principal problem not specified>  Patient Active Problem List   Diagnosis Date Noted  . SCI (spinal cord injury) 07/12/2013  . Spinal cord compression 07/07/2013    Expected Discharge Date: Expected Discharge Date: 07/20/13  Team Members Present: Physician leading conference: Dr. Alger Simons Social Worker Present: Lennart Pall, LCSW;Jenny Mahamed Zalewski, LCSW Nurse Present: Elliot Cousin, RN PT Present: [name], Rayburn Ma, PT OT Present: Chrys Racer, Starling Manns, OT SLP Present: Weston Anna, SLP PPS Coordinator present : Daiva Nakayama, RN, CRRN     Current Status/Progress Goal Weekly Team Focus  Medical   thoracic myelopathy s/p decompressionl. pain issues   pain issue, safety  safey, pain, bowel and bladder   Bowel/Bladder   continent of bowel and bladder  remain continent  monitor for constipation   Swallow/Nutrition/ Hydration             ADL's   distant supervision overall  mod I overall  functional mobility, dynamic standing balance, overall activity tolerance/endurance, pain management   Mobility   S/steady A  mod I overall; S stairs  d/c planning, neuro re-ed for balance/coordination, strengthening, endurance, safety   Communication             Safety/Cognition/ Behavioral Observations            Pain   c/o back pain. requests oxycodone 10mg  q4 hours.   pain less than 3  assess for pain q  shift and prn   Skin   no breakdown  no new breakdown  assess skin q shift    Rehab Goals Patient on target to meet rehab goals: Yes Rehab Goals Revised: None *See Care Plan and progress notes for long and short-term goals.  Barriers to Discharge: none    Possible Resolutions to Barriers:  none    Discharge Planning/Teaching Needs:  Pt plans to go to his brother's home in [place] at d/c.  Pt is very private and independent and plans to care for himself at home.  No family education planned at this time.   Team Discussion:  Pt is doing better with balance and coordination.  Pt will be modified independent at d/c.  Medically, pt is stable and ready for d/c.  Revisions to Treatment Plan:  Pt will be d/c'd 07-20-13 after therapies.   Continued Need for Acute Rehabilitation Level of Care: The patient requires daily medical management by a physician with specialized training in physical medicine and rehabilitation for the following conditions: Daily direction of a multidisciplinary physical rehabilitation program to ensure safe treatment while eliciting the highest outcome that is of practical value to the patient.: Yes Daily medical management of patient stability for increased activity during participation in an intensive rehabilitation regime.: Yes Daily analysis of laboratory values and/or radiology reports with any subsequent need for medication adjustment of medical intervention for : Neurological problems;Post surgical problems  Roann Merk, Silvestre Mesi 07/20/2013, 9:53 AM

## 2013-07-20 NOTE — Progress Notes (Signed)
Occupational Therapy Discharge Summary   Patient Details  Name: Eddie Rocha MRN: [id number] Date of Birth: 11/30/1965  Today's Date: 07/20/2013  Patient has met 10 of 11 long term goals due to improved activity tolerance, improved balance, postural control, ability to compensate for deficits, improved attention, improved awareness and improved coordination.  Patient to discharge at overall Modified Independent level. Patient able to recall 3/3 back precautions independently. Patient has been educated on aspects of ADLs, IADLs, functional mobility, safety with use of rolling walker, and overall/general safety. Patient continues to present with ataxic movements, especially during ambulation/functional mobility. Pain continued to be a barrier during therapies on CIR.    Reasons goals not met: Patient did not meet BUE HEP goal secondary to pain near incision site. When attempted to work with patient on exercise program, patient would complain of too much pain. Did work with patient on ergometer for UB strengthening during CIR stay, which didn't cause pain> incision site.  Recommendation: No additional occupational therapy recommended at this time.   Equipment: tub transfer bench, patient refused BSC at this time  Reasons for discharge: treatment goals met and discharge from hospital  Patient/family agrees with progress made and goals achieved: Yes  Precautions/Restrictions  Precautions Precautions: Back;Fall Precaution Comments: Able to recall 3/3 back precautions Restrictions Weight Bearing Restrictions: No  ADL - See FIM  Vision/Perception  Vision - History Baseline Vision: No visual deficits Patient Visual Report: No change from baseline Perception Perception: Within Functional Limits Praxis Praxis: Intact   Cognition Overall Cognitive Status: Within Functional Limits for tasks assessed Arousal/Alertness: Awake/alert Orientation Level: Oriented  X4  Sensation Sensation Light Touch:  (same as admission; reports numbess in BLE knees down) Proprioception: Impaired Detail (using visual feedback to compensate) Proprioception Impaired Details: Impaired RLE;Impaired LLE Coordination Gross Motor Movements are Fluid and Coordinated: Yes Coordination and Movement Description: impaired: ataxic in LE's BUEs appear intact for sensation and gross & fine motor movements  Motor  Motor Motor: Ataxia Motor - Discharge Observations: improved since admission  Trunk/Postural Assessment  Cervical Assessment Cervical Assessment: Within Functional Limits Thoracic Assessment Thoracic Assessment: Exceptions to WFL (back precautions) Lumbar Assessment Lumbar Assessment: Exceptions to WFL (back precautions) Postural Control Postural Control: Within Functional Limits   Balance Balance Balance Assessed: Yes Static Sitting Balance Static Sitting - Level of Assistance: 6: Modified independent (Device/Increase time) Dynamic Sitting Balance Dynamic Sitting - Level of Assistance: 6: Modified independent (Device/Increase time) Static Standing Balance Static Standing - Level of Assistance: 6: Modified independent (Device/Increase time) (with RW) Dynamic Standing Balance Dynamic Standing - Level of Assistance: 6: Modified independent (Device/Increase time) (with RW)  Extremity/Trunk Assessment RUE Assessment RUE Assessment: Within Functional Limits LUE Assessment LUE Assessment: Within Functional Limits  See FIM for current functional status  CLAY,PATRICIA 07/20/2013, 11:44 AM  

## 2013-07-20 NOTE — Discharge Instructions (Signed)
Inpatient Rehab Discharge Instructions  Eddie Rocha Discharge date and time:   07/20/13  Activities/Precautions/ Functional Status: Activity: no lifting, driving, or strenuous exercise for till cleared by MD. Do not lift anything over 5 lbs Diet: regular diet Wound Care: keep wound clean and dry  Functional status:  ___ No restrictions     ___ Walk up steps independently ___ 24/7 supervision/assistance   ___ Walk up steps with assistance _X__ Intermittent supervision/assistance  _X__ Bathe/dress independently _X__ Walk with walker    ___ Bathe/dress with assistance ___ Walk Independently    ___ Shower independently ___ Walk with assistance    ___ Shower with assistance _X__ No alcohol     ___ Return to work/school ________  COMMUNITY REFERRALS UPON DISCHARGE:   Home Health:   PT               Agency:  [facility] Phone:  [phone] Medical Equipment/Items Ordered:  rolling walker, tub bench  Agency/Supplier:  [facility]  Special Instructions:   My questions have been answered and I understand these instructions. I will adhere to these goals and the provided educational materials after my discharge from the hospital.  Patient/Caregiver Signature _______________________________ Date __________  Clinician Signature _______________________________________ Date __________  Please bring this form and your medication list with you to all your follow-up doctor's appointments.

## 2013-07-20 NOTE — Progress Notes (Signed)
Pt was discharged at 1250. Algis Liming, PA gave the discharge instruction.

## 2013-07-20 NOTE — Plan of Care (Signed)
Problem: RH PAIN MANAGEMENT Goal: RH STG PAIN MANAGED AT OR BELOW PT'S PAIN GOAL Less than 7  Outcome: Progressing Takes med and says it helps and he goes to sleep but awakens in pain.MD aware.

## 2013-07-20 NOTE — Discharge Summary (Signed)
Physician Discharge Summary  Patient ID: Eddie Rocha MRN: [id number] DOB/AGE: 1966-01-16 48 y.o.  Admit date: 07/12/2013 Discharge date: 07/20/2013  Discharge Diagnoses:  Active Problems:   SCI (spinal cord injury)   Discharged Condition: Stable.   Significant Diagnostic Studies: N/A  Labs:  Basic Metabolic Panel:     Component Value Date/Time   NA 137 07/13/2013 0500   K 4.7 07/13/2013 0500   CL 96 07/13/2013 0500   CO2 25 07/13/2013 0500   GLUCOSE 91 07/13/2013 0500   BUN 21 07/13/2013 0500   CREATININE 0.94 07/19/2013 0600   CALCIUM 9.8 07/13/2013 0500   GFRNONAA >90 07/19/2013 0600   GFRAA >90 07/19/2013 0600     CBC:     Component Value Date/Time   WBC 5.9 07/13/2013 0500   RBC 4.18* 07/13/2013 0500   HGB 13.3 07/13/2013 0500   HCT 39.9 07/13/2013 0500   PLT 306 07/13/2013 0500   MCV 95.5 07/13/2013 0500   MCH 31.8 07/13/2013 0500   MCHC 33.3 07/13/2013 0500   RDW 13.6 07/13/2013 0500   LYMPHSABS 1.6 07/13/2013 0500   MONOABS 0.7 07/13/2013 0500   EOSABS 0.6 07/13/2013 0500   BASOSABS 0.0 07/13/2013 0500      CBG: No results found for this basename: GLUCAP,  in the last 168 hours  Brief HPI:   Eddie Rocha is a 48 y.o. male who was admitted on 07/06/13 with complaints of back pain and BLE weakness with inability to walk. Patient reported that he had been wrestling with family member 5 days PTA with onset of pain and weakness with LE numbness. MRI spine with compressive lesion T9-T10 with edema and underlying thoracic disc herniation. He was taken to OR 07/07/13 for decompression of spinal cord by Dr. Kathyrn Sheriff. He continues with paraparesis as well as back pain. Therapies initiated and rehab team recommending CIR.    Hospital Course: Eddie Rocha was admitted to rehab 07/12/2013 for inpatient therapies to consist of PT, ST and OT at least three hours five days a week. Past admission physiatrist, therapy team and rehab RN have worked together to provide customized collaborative  inpatient rehab. Post admission labs showed H/H and renal status to be stable. Blood pressures have been well controlled. Po  Intake has been good and he's continent of bowel and bladder. Back incision has been healing without signs or symptoms of infection and is intact. Pain control has been variable and gabapentin was added to help with neuropathy. He has had problems with jaw pain due to hardware exposure on lower gum line. He was advised to use wax to cover this and follow up [facility] in two weeks. He progressed to independent level but continues to require a walker due to ataxic gait. He will continue to receive HHPT by [facility] past discharge.    Rehab course: During patient's stay in rehab weekly team conferences were held to monitor patient's progress, set goals and discuss barriers to discharge. Patient has had improvement in activity tolerance, balance, postural control, as well as ability to compensate for deficits. He is has had improvement in functional use BLE. He ambulating at modified independent level with use of RW. He is modified independent for all ADL tasks.    Disposition: 01-Home or Self Care   Diet: Regular  Special Instructions: 1. No strenuous  activity. No bending, twisting, arching, driving or lifting anything over 5 lbs.'       Future Appointments Provider Department Dept  Phone   08/09/2013 11:00 AM [facility] Cover[facility] Provider [place] [phone]   08/09/2013 12:00 PM [facility] [place] [phone]   08/22/2013 12:40 PM Meredith Staggers, MD [facility] [phone]       Medication List         acetaminophen 325 MG tablet  Commonly known as:  TYLENOL  Take 1-2 tablets (325-650 mg total) by mouth every 4 (four) hours as needed for mild pain.     baclofen 5 mg Tabs tablet  Commonly known as:  LIORESAL  Take 0.5 tablets (5 mg  total) by mouth 2 (two) times daily as needed for muscle spasms.     clonazePAM 0.5 MG tablet  Commonly known as:  KLONOPIN  Take 1 tablet (0.5 mg total) by mouth at bedtime as needed (insomnia).     gabapentin 100 MG capsule  Commonly known as:  NEURONTIN  Take 1 capsule (100 mg total) by mouth 3 (three) times daily.     nicotine 21 mg/24hr patch  Commonly known as:  NICODERM CQ - dosed in mg/24 hours  - Place 1 patch (21 mg total) onto the skin daily. For another two weeks. Then decrease to 14 mg patch daily for a month. Then decrease to 7 mg patch daily for a month. Then stop.   -   - No smoking with patch.     Oxycodone HCl 10 MG Tabs Rx # 60 pills   Take 0.5-1 tablets (5-10 mg total) by mouth every 6 (six) hours as needed for severe pain.     senna-docusate 8.6-50 MG per tablet  Commonly known as:  Senokot-S  Take 1 tablet by mouth 2 (two) times daily. For constipaton     traMADol 50 MG tablet Rx # 60 pills   Commonly known as:  ULTRAM  Take 1 tablet (50 mg total) by mouth every 6 (six) hours as needed for moderate pain.       Follow-up Information   Follow up with Meredith Staggers, MD On 08/22/2013. (Be there at 12:15   for 12:40 pm  appointment)    Specialty:  Physical Medicine and Rehabilitation   Contact information:   [address] [place] [address] [phone]       Follow up with Jairo Ben, MD. Call today. (for follow up appointment in 2 weeks. )    Specialty:  Neurosurgery   Contact information:   [address], [address] [place]  [address] [phone]       Follow up with [facility]     On 08/09/2013. (@ 11am)    Contact information:   [address] [place]  [address] [phone]      Signed: Bary Leriche 07/22/2013, 7:29 PM

## 2013-07-20 NOTE — Progress Notes (Signed)
Recreational Therapy Discharge Summary Patient Details  Name: Eddie Rocha MRN: [id number] Date of Birth: 10/15/1965 Today's Date: 07/20/2013  Long term goals set: 1  Long term goals met: 1  Comments on progress toward goals: Pt is discharging home today at Mod I level.  Pt only participated in TR tasks on evaluation day with refusal the following day.   Reasons for discharge: discharge from hospital Patient/family agrees with progress made and goals achieved: Yes  [name] 07/20/2013, 8:13 AM

## 2013-07-20 NOTE — Progress Notes (Signed)
Physical Therapy Discharge Summary  Patient Details  Name: Eddie Rocha MRN: [id number] Date of Birth: [birth year]  Today's Date: 07/20/2013 Time: 8882-8003 Time Calculation (min): 50 min Individual therapy; Session focused on grad day activities and preparation for d/c today. Pt mod I with basic transfers and gait using RW on unit and in room (made mod I in room). Reviewed safety recommendations with pt at home and verbalized what to do in case of fall/emergency to which he verbalized understanding and in agreement. Simulated car transfer with S into truck height. Stair negotiation to simulate home entry with S using bilateral rails up/down 15 steps. Gait training without AD to challenge balance x 10' x 2. Nustep for LE strengthening and endurance on level 9 x 15 minutes.   Patient has met 8 of 8 long term goals due to improved activity tolerance, improved balance, improved postural control and improved coordination.  Patient to discharge at an ambulatory level Modified Independent with RW.  Reasons goals not met: n/a - all goals met.  Recommendation:  Patient will benefit from ongoing skilled PT services in home health setting to continue to advance safe functional mobility, address ongoing impairments in gait, balance, safety, strength, coordination, endurance, safety, and minimize fall risk.  Equipment: RW  Reasons for discharge: treatment goals met and discharge from hospital  Patient/family agrees with progress made and goals achieved: Yes  PT Discharge Precautions/Restrictions Precautions Precautions: Back;Fall Precaution Comments: Able to recall 3/3 back precautions Restrictions Weight Bearing Restrictions: No Pain Reports 10/10 pain in back - RN notified for pain medication.  Vision/Perception  Perception Perception: Within Functional Limits Praxis Praxis: Intact  Cognition Overall Cognitive Status: Within Functional Limits for tasks assessed Arousal/Alertness:  Awake/alert Orientation Level: Oriented X4 Sensation Sensation Light Touch:  (same as admission; reports numbess in BLE knees down) Proprioception: Impaired Detail (using visual feedback to compensate) Proprioception Impaired Details: Impaired RLE;Impaired LLE Coordination Gross Motor Movements are Fluid and Coordinated: Yes Coordination and Movement Description: impaired: ataxic in LE's Motor  Motor Motor: Ataxia Motor - Discharge Observations: improved since admission   Locomotion  Ambulation Ambulation/Gait Assistance: 6: Modified independent (Device/Increase time)  Trunk/Postural Assessment  Cervical Assessment Cervical Assessment: Within Functional Limits Thoracic Assessment Thoracic Assessment: Exceptions to [facility] (back precautions) Lumbar Assessment Lumbar Assessment: Exceptions to [facility] (back precautions) Postural Control Postural Control: Within Functional Limits  Balance Balance Balance Assessed: Yes Static Sitting Balance Static Sitting - Level of Assistance: 6: Modified independent (Device/Increase time) Dynamic Sitting Balance Dynamic Sitting - Level of Assistance: 6: Modified independent (Device/Increase time) Static Standing Balance Static Standing - Level of Assistance: 6: Modified independent (Device/Increase time) (with RW) Dynamic Standing Balance Dynamic Standing - Level of Assistance: 6: Modified independent (Device/Increase time) (with RW) Extremity Assessment      RLE Assessment RLE Assessment: Exceptions to [facility] RLE Strength RLE Overall Strength Comments: ROM WFL; strength grossly 4/5; decreased muscular endurance LLE Assessment LLE Assessment: Exceptions to [facility] LLE Strength LLE Overall Strength Comments: ROM WFL; strength grossly 4-/5; decreased muscular endurance  See FIM for current functional status  [name] [facility] 07/20/2013, 9:18 AM

## 2013-07-20 NOTE — Progress Notes (Signed)
Subjective/Complaints: No new issues. Rested comfortably. No new complaints. Mod I in room A 12 point review of systems has been performed and if not noted above is otherwise negative.   Objective: Vital Signs: Blood pressure 106/69, pulse 71, temperature 98.1 F (36.7 C), temperature source Oral, resp. rate 20, height 5' 8.11" (1.73 m), weight 63 kg (138 lb 14.2 oz), SpO2 100.00%. No results found. No results found for this basename: WBC, HGB, HCT, PLT,  in the last 72 hours  Recent Labs  07/19/13 0600  CREATININE 0.94   CBG (last 3)  No results found for this basename: GLUCAP,  in the last 72 hours  Wt Readings from Last 3 Encounters:  07/12/13 63 kg (138 lb 14.2 oz)  07/07/13 63.05 kg (139 lb)  07/07/13 63.05 kg (139 lb)    Physical Exam:  Constitutional: He is oriented to person, place, and time. He appears well-developed and well-nourished.  HENT: oral mucosa irritated along lower teeth, no frank ulceration Head: Normocephalic and atraumatic.  Eyes: Conjunctivae are normal. Pupils are equal, round, and reactive to light.  Neck: Normal range of motion. Neck supple.  Cardiovascular: Normal rate and regular rhythm.  No murmur heard.  Respiratory: Effort normal and breath sounds normal. No respiratory distress. He has no wheezes.  GI: Soft. Bowel sounds are normal. He exhibits no distension. There is no tenderness.  Musculoskeletal: He exhibits no edema and no tenderness.  Neurological: He is alert and oriented to person, place, and time.    Decreased PP and LT below the umbilicus (1+/2). LE grossly 4- to 4/5 proximal to distal. DTR's trace. Still no resting tone. UE's normal  Skin: Skin is warm and dry.    thoracic incision with skin glue--clean, dry and intact .  Psychiatric: He has a normal mood and affect. His behavior is normal. Slow to arouse again this am   Assessment/Plan: 1. 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 Physiatrist is providing close team supervision and 24 hour management of active medical problems listed below. Physiatrist and rehab team continue to assess barriers to discharge/monitor patient progress toward functional and medical goals.  DC home tomorrow in AM. Mod I today in the room. FIM: FIM - Bathing Bathing Steps Patient Completed: Chest;Right Arm;Left Arm;Abdomen;Front perineal area;Buttocks;Right upper leg;Left upper leg;Right lower leg (including foot);Left lower leg (including foot) Bathing: 5: Supervision: Safety issues/verbal cues  FIM - Upper Body Dressing/Undressing Upper body dressing/undressing steps patient completed: Thread/unthread right sleeve of pullover shirt/dresss;Thread/unthread left sleeve of pullover shirt/dress;Put head through opening of pull over shirt/dress;Pull shirt over trunk Upper body dressing/undressing: 7: Complete Independence: No helper FIM - Lower Body Dressing/Undressing Lower body dressing/undressing steps patient completed: Thread/unthread right underwear leg;Thread/unthread left underwear leg;Pull underwear up/down;Thread/unthread right pants leg;Thread/unthread left pants leg;Pull pants up/down;Don/Doff right shoe;Don/Doff left shoe;Fasten/unfasten right shoe;Fasten/unfasten left shoe Lower body dressing/undressing:  (supervision to maintain back precautions)  FIM - Toileting Toileting steps completed by patient: Adjust clothing prior to toileting;Performs perineal hygiene;Adjust clothing after toileting Toileting: 5: Supervision: Safety issues/verbal cues  FIM - Air cabin crew Transfers Assistive Devices: Walker;Grab bars Toilet Transfers: 5-To toilet/BSC: Supervision (verbal cues/safety issues);5-From toilet/BSC: Supervision (verbal cues/safety issues)  FIM - Control and instrumentation engineer Devices: Copy: 6:  Assistive device: no helper  FIM - Locomotion: Wheelchair Locomotion: Wheelchair: 0: Activity did not occur (gait primary means) FIM - Locomotion: Ambulation Locomotion:  Ambulation Assistive Devices: Walker - Rolling Ambulation/Gait Assistance: 6: Modified independent (Device/Increase time) Locomotion: Ambulation: 6: Travels 150 ft or more with assistive device/no helper  Comprehension Comprehension Mode: Auditory Comprehension: 5-Understands complex 90% of the time/Cues < 10% of the time  Expression Expression Mode: Verbal Expression: 5-Expresses basic needs/ideas: With no assist  Social Interaction Social Interaction: 4-Interacts appropriately 75 - 89% of the time - Needs redirection for appropriate language or to initiate interaction.  Problem Solving Problem Solving: 5-Solves basic problems: With no assist  Memory Memory: 5-Recognizes or recalls 90% of the time/requires cueing < 10% of the time  Medical Problem List and Plan:  SCI with cord compression  1. DVT Prophylaxis/Anticoagulation: Pharmaceutical: Lovenox  2. Pain Management: will add gabapentin 100mg  tid for leg pain 3. Mood: motivation an issue at times. Will discuss with patient. Overall has been doing fairly well 4. Neuropsych: This patient is capable of making decisions on his own behalf.  5. Spasticity:   baclofen prn has been helpful. 6. Jaw pain--lower set of wires causing irritation  -wax to hardware on lower  teeth  -he sees [facility] for follow up in about two weeks.  LOS (Days) 8 A FACE TO FACE EVALUATION WAS PERFORMED  Erik Nessel T 07/20/2013 9:32 AM

## 2013-07-21 NOTE — Progress Notes (Signed)
Social Work Discharge Note  The overall goal for the admission was met for:   Discharge location: Yes - home  Length of Stay: Yes - 8 days  Discharge activity level: Yes - Modified Independent  Home/community participation: Yes  Services provided included: MD, RD, PT, OT, RN, TR, Pharmacy and SW  Financial Services: Other: Pt applied for Medicaid and disability while on Rehab  Follow-up services arranged: Home Health: PT, DME: rolling walker and tub bench and Patient/Family has no preference for HH/DME agencies  Comments (or additional information):  Patient/Family verbalized understanding of follow-up arrangements: Yes  Individual responsible for coordination of the follow-up plan: patient  Confirmed correct DME delivered: Trey Sailors 07/21/2013    Eddie Rocha, Eddie Rocha

## 2013-07-21 NOTE — Progress Notes (Signed)
Social Work Patient ID: Eddie Rocha, male   DOB: 1965-07-21, 48 y.o.   MRN: [id number]  Late entry for 07-19-13: Met with pt to update him on team conference discussion.  Told him of d/c date, equipment, and follow up therapies.  Pt will be receiving home health for PT and CSW got him a rolling walker and tub bench from [facility].  Pt will receive follow up care at the [facility] and applied for Medicaid and disability while on Rehab unit.  Pt to return to his brother's home in [place].  No concerns or questions.  Pt has funds for medications and requested the name brand prescriptions.  CSW passed this information on to PA.

## 2013-07-28 ENCOUNTER — Telehealth: Payer: Self-pay

## 2013-07-28 NOTE — Telephone Encounter (Signed)
Patient states he almost fell at the toilet and dropped his Oxycodone 10 mg in the toilet. He is requesting a refill.

## 2013-07-30 NOTE — Telephone Encounter (Signed)
This is quite suspicious to be honest.  i will give him tramadol if he would like

## 2013-08-03 NOTE — Telephone Encounter (Signed)
Left message for patient regarding his spilling his medication.

## 2013-08-08 DIAGNOSIS — Z4789 Encounter for other orthopedic aftercare: Secondary | ICD-10-CM

## 2013-08-08 DIAGNOSIS — A309 Leprosy, unspecified: Secondary | ICD-10-CM

## 2013-08-08 DIAGNOSIS — IMO0001 Reserved for inherently not codable concepts without codable children: Secondary | ICD-10-CM

## 2013-08-09 ENCOUNTER — Ambulatory Visit: Payer: Self-pay

## 2013-08-09 ENCOUNTER — Encounter: Payer: Self-pay | Admitting: Internal Medicine

## 2013-08-09 ENCOUNTER — Ambulatory Visit: Payer: Medicaid Other | Attending: Internal Medicine | Admitting: Internal Medicine

## 2013-08-09 VITALS — BP 170/99 | HR 78 | Temp 97.8°F | Resp 14 | Ht 68.0 in | Wt 155.8 lb

## 2013-08-09 DIAGNOSIS — F17200 Nicotine dependence, unspecified, uncomplicated: Secondary | ICD-10-CM | POA: Insufficient documentation

## 2013-08-09 DIAGNOSIS — G959 Disease of spinal cord, unspecified: Secondary | ICD-10-CM | POA: Insufficient documentation

## 2013-08-09 DIAGNOSIS — Z9889 Other specified postprocedural states: Secondary | ICD-10-CM

## 2013-08-09 MED ORDER — CLONAZEPAM 0.5 MG PO TABS: 0.5000 mg | ORAL_TABLET | Freq: Every evening | ORAL | Status: DC | PRN

## 2013-08-09 MED ORDER — ACETAMINOPHEN 325 MG PO TABS: 325.0000 mg | ORAL_TABLET | ORAL | Status: DC | PRN

## 2013-08-09 MED ORDER — GABAPENTIN 300 MG PO CAPS: 100.0000 mg | ORAL_CAPSULE | Freq: Two times a day (BID) | ORAL | Status: DC

## 2013-08-09 MED ORDER — SENNOSIDES-DOCUSATE SODIUM 8.6-50 MG PO TABS: 1.0000 | ORAL_TABLET | Freq: Two times a day (BID) | ORAL | Status: DC

## 2013-08-09 MED ORDER — TRAMADOL HCL 50 MG PO TABS: 50.0000 mg | ORAL_TABLET | Freq: Four times a day (QID) | ORAL | Status: DC | PRN

## 2013-08-09 MED ORDER — BACLOFEN 5 MG HALF TABLET: 5.0000 mg | ORAL_TABLET | Freq: Two times a day (BID) | ORAL | Status: DC | PRN

## 2013-08-09 NOTE — Progress Notes (Signed)
Pt is here for a hospital f/u for a spinal cord compression. Pt uses crutches for walking. Needs medication refills on all medication. Complains of back pain. Pain scale of 8 today. Pt seems hypertensive. Had surgery on his spine. Pain is his lower back. Also complains that wire in his mouth is causing pain. May need a referral to a dentist. Interferes with pt being able to eat.

## 2013-08-09 NOTE — Progress Notes (Signed)
Patient ID: Eddie Rocha, male   DOB: 1965/10/21, 48 y.o.   MRN: [id number]   CC:  HPI: 48 y.o. male who was admitted on 07/06/13 with complaints of back pain and BLE weakness with inability to walk. Patient reported that he had been wrestling with family member 5 days PTA with onset of pain and weakness with LE numbness. MRI spine with compressive lesion T9-T10 with edema and underlying thoracic disc herniation. He was taken to OR 07/07/13 for decompression of spinal cord by Dr. Kathyrn Sheriff. He continues with paraparesis as well as back pain. Therapies initiated and rehab team recently discharged him on 1/6 in stable condition. The patient today continues to have right-sided back pain with radiation into the right leg and right leg numbness. He has not had a followup appointment with neurosurgery yet  He denies any stool or urinary incontinence. He is requesting refills on all his medications.   He also complains of jaw pain. He has a history of extensive jaw reconstruction, and before is [employer]. He has an appointment on 2/6 next       No Known Allergies Past Medical History  Diagnosis Date  . Hernia    Current Outpatient Prescriptions on File Prior to Visit  Medication Sig Dispense Refill  . nicotine (NICODERM CQ - DOSED IN MG/24 HOURS) 21 mg/24hr patch Place 1 patch (21 mg total) onto the skin daily. For another two weeks. Then decrease to 14 mg patch daily for a month. Then decrease to 7 mg patch daily for a month. Then stop.   No smoking with patch.  28 patch  0  . oxyCODONE 10 MG TABS Take 0.5-1 tablets (5-10 mg total) by mouth every 6 (six) hours as needed for severe pain.  60 tablet  0   No current facility-administered medications on file prior to visit.   Family History  Problem Relation Age of Onset  . Cancer Brother     throat   History   Social History  . Marital Status: Legally Separated    Spouse Name: N/A    Number of Children: N/A  . Years of Education:  N/A   Occupational History  . Not on file.   Social History Main Topics  . Smoking status: Current Every Day Smoker -- 1.00 packs/day for 28 years    Types: Cigarettes  . Smokeless tobacco: Never Used  . Alcohol Use: Yes     Comment:    6 PK ON WEEKENDS  . Drug Use: No  . Sexual Activity: No   Other Topics Concern  . Not on file   Social History Narrative  . No narrative on file    Review of Systems  Constitutional: As in history of present illness HENT: Negative for ear pain, nosebleeds, congestion, facial swelling, rhinorrhea, neck pain, neck stiffness and ear discharge.   Eyes: Negative for pain, discharge, redness, itching and visual disturbance.  Respiratory: Negative for cough, choking, chest tightness, shortness of breath, wheezing and stridor.   Cardiovascular: Negative for chest pain, palpitations and leg swelling.  Gastrointestinal: Negative for abdominal distention.  Genitourinary: Negative for dysuria, urgency, frequency, hematuria, flank pain, decreased urine volume, difficulty urinating and dyspareunia.  Musculoskeletal: Negative for back pain, joint swelling, arthralgias and gait problem.  Neurological: As in history of present illness Negative  for adenopathy. Does not bruise/bleed easily.  Psychiatric/Behavioral: Negative for hallucinations, behavioral problems, confusion, dysphoric mood, decreased concentration and agitation.    Objective:   Filed Vitals:  08/09/13 1124  BP: 170/99  Pulse: 78  Temp: 97.8 F (36.6 C)  Resp: 14    Physical Exam  Constitutional: Appears well-developed and well-nourished. No distress.  HENT: Normocephalic. External right and left ear normal. Oropharynx is clear and moist.  Eyes: Conjunctivae and EOM are normal. PERRLA, no scleral icterus.  Neck: Normal ROM. Neck supple. No JVD. No tracheal deviation. No thyromegaly.  CVS: RRR, S1/S2 +, no murmurs, no gallops, no carotid bruit.  Pulmonary: Effort and breath sounds  normal, no stridor, rhonchi, wheezes, rales.  Abdominal: Soft. BS +,  no distension, tenderness, rebound or guarding.  Musculoskeletal: Normal range of motion. No edema and no tenderness.  Lymphadenopathy: No lymphadenopathy noted, cervical, inguinal. Neuro: Alert. Normal reflexes, muscle tone coordination. No cranial nerve deficit. Skin: Skin is warm and dry. No rash noted. Not diaphoretic. No erythema. No pallor.  Psychiatric: Normal mood and affect. Behavior, judgment, thought content normal.   Lab Results  Component Value Date   WBC 5.9 07/13/2013   HGB 13.3 07/13/2013   HCT 39.9 07/13/2013   MCV 95.5 07/13/2013   PLT 306 07/13/2013   Lab Results  Component Value Date   CREATININE 0.94 07/19/2013   BUN 21 07/13/2013   NA 137 07/13/2013   K 4.7 07/13/2013   CL 96 07/13/2013   CO2 25 07/13/2013    No results found for this basename: HGBA1C   Lipid Panel  No results found for this basename: chol, trig, hdl, cholhdl, vldl, ldlcalc       Assessment and plan:   Patient Active Problem List   Diagnosis Date Noted  . SCI (spinal cord injury) 07/12/2013  . 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 Tessa Lerner  Extensive orofacial surgery in [facility] Patient needs to follow up with his oral surgeon on 2/6  Followup in 2 months    The patient was given clear instructions to go to ER or return to medical center if symptoms don't improve, worsen or new problems develop. The patient verbalized understanding. The patient was told to call to get any lab results if not heard anything in the next week.

## 2013-08-11 DIAGNOSIS — M4800 Spinal stenosis, site unspecified: Secondary | ICD-10-CM | POA: Insufficient documentation

## 2013-08-11 DIAGNOSIS — M549 Dorsalgia, unspecified: Secondary | ICD-10-CM

## 2013-08-11 DIAGNOSIS — G8929 Other chronic pain: Secondary | ICD-10-CM | POA: Insufficient documentation

## 2013-08-22 ENCOUNTER — Encounter: Payer: Medicaid Other | Attending: Physical Medicine & Rehabilitation | Admitting: Physical Medicine & Rehabilitation

## 2013-08-22 ENCOUNTER — Encounter: Payer: Self-pay | Admitting: Physical Medicine & Rehabilitation

## 2013-08-22 VITALS — BP 107/63 | HR 97 | Resp 14 | Ht 68.0 in | Wt 156.0 lb

## 2013-08-22 DIAGNOSIS — G959 Disease of spinal cord, unspecified: Secondary | ICD-10-CM

## 2013-08-22 DIAGNOSIS — M549 Dorsalgia, unspecified: Secondary | ICD-10-CM | POA: Insufficient documentation

## 2013-08-22 DIAGNOSIS — Z79899 Other long term (current) drug therapy: Secondary | ICD-10-CM

## 2013-08-22 DIAGNOSIS — F17200 Nicotine dependence, unspecified, uncomplicated: Secondary | ICD-10-CM | POA: Insufficient documentation

## 2013-08-22 DIAGNOSIS — M5104 Intervertebral disc disorders with myelopathy, thoracic region: Secondary | ICD-10-CM | POA: Insufficient documentation

## 2013-08-22 DIAGNOSIS — M792 Neuralgia and neuritis, unspecified: Secondary | ICD-10-CM | POA: Insufficient documentation

## 2013-08-22 DIAGNOSIS — Z5181 Encounter for therapeutic drug level monitoring: Secondary | ICD-10-CM

## 2013-08-22 DIAGNOSIS — IMO0002 Reserved for concepts with insufficient information to code with codable children: Secondary | ICD-10-CM

## 2013-08-22 DIAGNOSIS — G9520 Unspecified cord compression: Secondary | ICD-10-CM

## 2013-08-22 MED ORDER — BACLOFEN 10 MG PO TABS: 10.0000 mg | ORAL_TABLET | Freq: Three times a day (TID) | ORAL | Status: DC

## 2013-08-22 MED ORDER — TRAMADOL HCL 50 MG PO TABS: 50.0000 mg | ORAL_TABLET | Freq: Four times a day (QID) | ORAL | Status: DC | PRN

## 2013-08-22 MED ORDER — GABAPENTIN 300 MG PO CAPS: 300.0000 mg | ORAL_CAPSULE | Freq: Three times a day (TID) | ORAL | Status: DC

## 2013-08-22 NOTE — Patient Instructions (Signed)
YOU HAVE TO WORK ON REGULAR STRETCHING, WALKING, AND STRENGTHENING.

## 2013-08-22 NOTE — Addendum Note (Signed)
Addended by: Caro Hight on: 08/22/2013 03:38 PM   Modules accepted: Orders

## 2013-08-22 NOTE — Progress Notes (Signed)
Subjective:    Patient ID: Eddie Rocha, male    DOB: 1966-05-31, 48 y.o.   MRN: [id number]  HPI  Eddie Rocha is back regarding his spinal cord injury. He was discharged from rehab about a month ago. He uses his walker at home and cane outside the house. He's had one fall where his left leg gave out, but he caught himself. He is able to walk short distances at home before he gives out.   He is having a lot of pain in his mid back. It hurts to lay on the surgical site and even to sit back against a chair. His pelvis feels that it's tight. He has pins and needles down both legs.   Pain Inventory Average Pain 10 Pain Right Now 10 My pain is constant, sharp and stabbing  In the last 24 hours, has pain interfered with the following? General activity 10 Relation with others 10 Enjoyment of life 10 What TIME of day is your pain at its worst? all Sleep (in general) Poor  Pain is worse with: walking, bending, sitting and standing Pain improves with: rest and medication Relief from Meds: 6  Mobility use a cane use a walker ability to climb steps?  no do you drive?  no  Function not employed: date last employed . I need assistance with the following:  household duties  Neuro/Psych bladder control problems bowel control problems numbness tremor tingling trouble walking spasms depression anxiety  Prior Studies Any changes since last visit?  no  Physicians involved in your care Any changes since last visit?  no   Family History  Problem Relation Age of Onset  . Cancer Brother     throat   History   Social History  . Marital Status: Legally Separated    Spouse Name: N/A    Number of Children: N/A  . Years of Education: N/A   Social History Main Topics  . Smoking status: Current Every Day Smoker -- 1.00 packs/day for 28 years    Types: Cigarettes  . Smokeless tobacco: Never Used  . Alcohol Use: Yes     Comment:    6 PK ON WEEKENDS  . Drug Use: No  .  Sexual Activity: No   Other Topics Concern  . None   Social History Narrative  . None   Past Surgical History  Procedure Laterality Date  . Mandible fracture surgery    . Hernia repair    . Laminectomy  07/06/2013    T9    T10    CORD COMPRESSION  . Lumbar laminectomy/decompression microdiscectomy N/A 07/06/2013    Procedure: LUMBAR LAMINECTOMY, DECOMPRESSION  T9 to T10;  Surgeon: Consuella Lose, MD;  Location: [facility];  Service: Neurosurgery;  Laterality: N/A;   Past Medical History  Diagnosis Date  . Hernia    BP 107/63  Pulse 97  Resp 14  Ht 5\' 8"  (1.727 m)  Wt 156 lb (70.761 kg)  BMI 23.73 kg/m2  SpO2 100%  Opioid Risk Score: 2 Fall Risk Score: Moderate Fall Risk (6-13 points) (educated and home fall prevention handout given)  Review of Systems  Musculoskeletal: Positive for back pain and gait problem.  Neurological: Positive for tremors, weakness and numbness.       Tingling  Psychiatric/Behavioral: The patient is nervous/anxious.   All other systems reviewed and are negative.       Objective:   Physical Exam  Constitutional: He is oriented to person, place, and time.  He appears well-developed and well-nourished.  HENT: oral mucosa irritated along lower teeth, no frank ulceration  Head: Normocephalic and atraumatic.  Eyes: Conjunctivae are normal. Pupils are equal, round, and reactive to light.  Neck: Normal range of motion. Neck supple.  Cardiovascular: Normal rate and regular rhythm.  No murmur heard.  Respiratory: Effort normal and breath sounds normal. No respiratory distress. He has no wheezes.  GI: Soft. Bowel sounds are normal. He exhibits no distension. There is no tenderness.  Musculoskeletal: He exhibits no edema and no tenderness.  Neurological: He is alert and oriented to person, place, and time.  Decreased PP and LT below the umbilicus (1+/2). LE grossly   4/5 proximal to distal with left leg stronger than right.  DTR's 3+, a few beats  of clonus at the ankles. UE's normal.   Skin: Skin is warm and dry.  thoracic incision healed--area sensitive to touch.  Psychiatric: he is a little irritable at times but generally friendly and appropriate.   Assessment/Plan:  1. 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   -times was spent today educating patient on this 2. Pain Management: continue gabapentin, but increase to 300mg  tid  -increase baclofen to 10mg  tid  -tramadol for breakthrough pain #60  -UDS was collected 3. Mood:he's a little frustrated as a whole. Insight not great either  4. Safety,gait  -discussed basic exercises and principles which I want him to work on.  -he needs to improve his gait technique, develop better posture and trunk control.  -I reminded him that he is still very on in the course of recovery.  5. Follow up in 2 months.

## 2013-09-02 ENCOUNTER — Telehealth: Payer: Self-pay

## 2013-09-02 NOTE — Telephone Encounter (Signed)
Women called on behalf of patient requesting a handicap form.  Message was unclear.

## 2013-09-02 NOTE — Telephone Encounter (Signed)
Patient wife was actually trying to contact surgeon.  Directed her in the right direction and reminded her of patients next appointment.

## 2013-09-29 ENCOUNTER — Encounter: Payer: Self-pay | Admitting: Internal Medicine

## 2013-09-29 ENCOUNTER — Ambulatory Visit: Payer: Medicaid Other | Attending: Internal Medicine | Admitting: Internal Medicine

## 2013-09-29 VITALS — BP 129/85 | HR 93 | Temp 97.9°F | Resp 16

## 2013-09-29 DIAGNOSIS — F17200 Nicotine dependence, unspecified, uncomplicated: Secondary | ICD-10-CM | POA: Insufficient documentation

## 2013-09-29 DIAGNOSIS — IMO0002 Reserved for concepts with insufficient information to code with codable children: Secondary | ICD-10-CM

## 2013-09-29 DIAGNOSIS — IMO0001 Reserved for inherently not codable concepts without codable children: Secondary | ICD-10-CM | POA: Insufficient documentation

## 2013-09-29 DIAGNOSIS — Z760 Encounter for issue of repeat prescription: Secondary | ICD-10-CM | POA: Insufficient documentation

## 2013-09-29 DIAGNOSIS — M792 Neuralgia and neuritis, unspecified: Secondary | ICD-10-CM

## 2013-09-29 MED ORDER — SENNOSIDES-DOCUSATE SODIUM 8.6-50 MG PO TABS: 1.0000 | ORAL_TABLET | Freq: Two times a day (BID) | ORAL | Status: DC

## 2013-09-29 MED ORDER — TRAMADOL HCL 50 MG PO TABS: 50.0000 mg | ORAL_TABLET | Freq: Four times a day (QID) | ORAL | Status: DC | PRN

## 2013-09-29 MED ORDER — NICOTINE 21 MG/24HR TD PT24: 21.0000 mg | MEDICATED_PATCH | Freq: Every day | TRANSDERMAL | Status: DC

## 2013-09-29 NOTE — Progress Notes (Signed)
MRN: [id number] Name: Eddie Rocha  Sex: male Age: 48 y.o. DOB: 1965/09/28  Allergies: Review of patient's allergies indicates no known allergies.  Chief Complaint  Patient presents with  . Medication Refill    HPI: Patient is 48 y.o. male who has to of a spinal cord injury/status post surgery, comes today requesting refill on pain medication, he has been following up with [facility] and was prescribed her Neurontin and baclofen which patient already has refills on he then out of tramadol, patient also smokes cigarettes.  Past Medical History  Diagnosis Date  . Hernia     Past Surgical History  Procedure Laterality Date  . Mandible fracture surgery    . Hernia repair    . Laminectomy  07/06/2013    T9    T10    CORD COMPRESSION  . Lumbar laminectomy/decompression microdiscectomy N/A 07/06/2013    Procedure: LUMBAR LAMINECTOMY, DECOMPRESSION  T9 to T10;  Surgeon: Consuella Lose, MD;  Location: [facility];  Service: Neurosurgery;  Laterality: N/A;      Medication List       This list is accurate as of: 09/29/13 12:29 PM.  Always use your most recent med list.               acetaminophen 325 MG tablet  Commonly known as:  TYLENOL  Take 1-2 tablets (325-650 mg total) by mouth every 4 (four) hours as needed for mild pain.     baclofen 10 MG tablet  Commonly known as:  LIORESAL  Take 1 tablet (10 mg total) by mouth 3 (three) times daily.     clonazePAM 0.5 MG tablet  Commonly known as:  KLONOPIN  Take 1 tablet (0.5 mg total) by mouth at bedtime as needed (insomnia).     gabapentin 300 MG capsule  Commonly known as:  NEURONTIN  Take 1 capsule (300 mg total) by mouth 3 (three) times daily.     nicotine 21 mg/24hr patch  Commonly known as:  NICODERM CQ - dosed in mg/24 hours  - Place 1 patch (21 mg total) onto the skin daily. For another two weeks. Then decrease to 14 mg patch daily for a month. Then decrease to 7 mg  patch daily for a month. Then stop.   -   - No smoking with patch.     Oxycodone HCl 10 MG Tabs  Take 0.5-1 tablets (5-10 mg total) by mouth every 6 (six) hours as needed for severe pain.     senna-docusate 8.6-50 MG per tablet  Commonly known as:  Senokot-S  Take 1 tablet by mouth 2 (two) times daily. For constipaton     traMADol 50 MG tablet  Commonly known as:  ULTRAM  Take 1 tablet (50 mg total) by mouth every 6 (six) hours as needed for moderate pain.        Meds ordered this encounter  Medications  . nicotine (NICODERM CQ - DOSED IN MG/24 HOURS) 21 mg/24hr patch    Sig: Place 1 patch (21 mg total) onto the skin daily. For another two weeks. Then decrease to 14 mg patch daily for a month. Then decrease to 7 mg patch daily for a month. Then stop.   No smoking with patch.    Dispense:  28 patch    Refill:  0    Order Specific Question:  Supervising Provider    Answer:  Alger Simons T [[id number]]  . senna-docusate (SENOKOT-S) 8.6-50  MG per tablet    Sig: Take 1 tablet by mouth 2 (two) times daily. For constipaton    Dispense:  60 tablet    Refill:  0    Order Specific Question:  Supervising Provider    Answer:  Alger Simons T [[id number]]  . traMADol (ULTRAM) 50 MG tablet    Sig: Take 1 tablet (50 mg total) by mouth every 6 (six) hours as needed for moderate pain.    Dispense:  60 tablet    Refill:  0    Order Specific Question:  Supervising Provider    Answer:  Alger Simons T [[id number]]     There is no immunization history on file for this patient.  Family History  Problem Relation Age of Onset  . Cancer Brother     throat    History  Substance Use Topics  . Smoking status: Current Every Day Smoker -- 1.00 packs/day for 28 years    Types: Cigarettes  . Smokeless tobacco: Never Used  . Alcohol Use: Yes     Comment:    6 PK ON WEEKENDS    Review of Systems   As noted in HPI  Filed Vitals:   09/29/13 1138  BP: 129/85  Pulse: 93  Temp: 97.9 F (36.6 C)   Resp: 16    Physical Exam  Physical Exam  HENT:  Head: Normocephalic and atraumatic.  Cardiovascular: Normal rate and regular rhythm.   Pulmonary/Chest: Breath sounds normal. No respiratory distress. He has no wheezes.  Musculoskeletal:  Mid back surgical scar and surrounding paraspinal tenderness     CBC    Component Value Date/Time   WBC 5.9 07/13/2013 0500   RBC 4.18* 07/13/2013 0500   HGB 13.3 07/13/2013 0500   HCT 39.9 07/13/2013 0500   PLT 306 07/13/2013 0500   MCV 95.5 07/13/2013 0500   LYMPHSABS 1.6 07/13/2013 0500   MONOABS 0.7 07/13/2013 0500   EOSABS 0.6 07/13/2013 0500   BASOSABS 0.0 07/13/2013 0500    CMP     Component Value Date/Time   NA 137 07/13/2013 0500   K 4.7 07/13/2013 0500   CL 96 07/13/2013 0500   CO2 25 07/13/2013 0500   GLUCOSE 91 07/13/2013 0500   BUN 21 07/13/2013 0500   CREATININE 0.94 07/19/2013 0600   CALCIUM 9.8 07/13/2013 0500   PROT 7.8 07/13/2013 0500   ALBUMIN 3.4* 07/13/2013 0500   AST 15 07/13/2013 0500   ALT 13 07/13/2013 0500   ALKPHOS 58 07/13/2013 0500   BILITOT 0.2* 07/13/2013 0500   GFRNONAA >90 07/19/2013 0600   GFRAA >90 07/19/2013 0600    No results found for this basename: chol,  tri,  ldl    No components found with this basename: hga1c    Lab Results  Component Value Date/Time   AST 15 07/13/2013  5:00 AM    Assessment and Plan  SCI (spinal cord injury) - Plan: Patient is given refill on tramadol, traMADol (ULTRAM) 50 MG tablet  Neuropathic pain - Plan: Continue with Neurontin and baclofen as prescribed  Smoking - Plan: nicotine (NICODERM CQ - DOSED IN MG/24 HOURS) 21 mg/24hr patch  Follow up as scheduled  Lorayne Marek, MD

## 2013-09-29 NOTE — Progress Notes (Signed)
Patient here for medication refills

## 2013-10-07 ENCOUNTER — Ambulatory Visit: Payer: Self-pay | Admitting: Internal Medicine

## 2013-10-17 ENCOUNTER — Ambulatory Visit: Payer: Medicaid Other | Attending: Internal Medicine

## 2013-10-19 ENCOUNTER — Encounter: Payer: Medicaid Other | Admitting: Physical Medicine & Rehabilitation

## 2013-12-09 ENCOUNTER — Encounter: Payer: Medicaid Other | Attending: Physical Medicine & Rehabilitation | Admitting: Physical Medicine & Rehabilitation

## 2013-12-09 DIAGNOSIS — M5104 Intervertebral disc disorders with myelopathy, thoracic region: Secondary | ICD-10-CM | POA: Insufficient documentation

## 2013-12-09 DIAGNOSIS — M549 Dorsalgia, unspecified: Secondary | ICD-10-CM | POA: Insufficient documentation

## 2013-12-09 DIAGNOSIS — F17200 Nicotine dependence, unspecified, uncomplicated: Secondary | ICD-10-CM | POA: Insufficient documentation

## 2013-12-31 ENCOUNTER — Emergency Department (HOSPITAL_COMMUNITY)
Admission: EM | Admit: 2013-12-31 | Discharge: 2013-12-31 | Disposition: A | Discharge status: Home / Self Care | Payer: Medicaid Other | Attending: Emergency Medicine | Admitting: Emergency Medicine

## 2013-12-31 ENCOUNTER — Encounter (HOSPITAL_COMMUNITY): Payer: Self-pay | Admitting: Emergency Medicine

## 2013-12-31 DIAGNOSIS — R209 Unspecified disturbances of skin sensation: Secondary | ICD-10-CM | POA: Insufficient documentation

## 2013-12-31 DIAGNOSIS — M549 Dorsalgia, unspecified: Secondary | ICD-10-CM

## 2013-12-31 DIAGNOSIS — Z8719 Personal history of other diseases of the digestive system: Secondary | ICD-10-CM | POA: Insufficient documentation

## 2013-12-31 DIAGNOSIS — M545 Low back pain, unspecified: Secondary | ICD-10-CM | POA: Insufficient documentation

## 2013-12-31 DIAGNOSIS — G8929 Other chronic pain: Secondary | ICD-10-CM | POA: Insufficient documentation

## 2013-12-31 DIAGNOSIS — F17200 Nicotine dependence, unspecified, uncomplicated: Secondary | ICD-10-CM | POA: Insufficient documentation

## 2013-12-31 DIAGNOSIS — Z791 Long term (current) use of non-steroidal anti-inflammatories (NSAID): Secondary | ICD-10-CM | POA: Insufficient documentation

## 2013-12-31 DIAGNOSIS — Z79899 Other long term (current) drug therapy: Secondary | ICD-10-CM | POA: Insufficient documentation

## 2013-12-31 HISTORY — DX: Other chronic pain: G89.29

## 2013-12-31 HISTORY — DX: Dorsalgia, unspecified: M54.9

## 2013-12-31 MED ORDER — OXYCODONE-ACETAMINOPHEN 5-325 MG PO TABS: 1.0000 | ORAL_TABLET | Freq: Once | ORAL | Status: DC

## 2013-12-31 MED ORDER — OXYCODONE-ACETAMINOPHEN 5-325 MG PO TABS: 1.0000 | ORAL_TABLET | Freq: Four times a day (QID) | ORAL | Status: DC | PRN

## 2013-12-31 NOTE — Discharge Instructions (Signed)
Take pain medicine as directed. Make a point to follow up with your doctor down in [place] in the next few days. Return for any newer worse symptoms.

## 2013-12-31 NOTE — ED Notes (Signed)
Patient c/o lower back pain. Patient reports having back surgery in December. Per patient "ran out" of his oxycodone and missed doctors appointment. Patient states "I can't take this pain."

## 2013-12-31 NOTE — ED Provider Notes (Addendum)
CSN: [id number]     Arrival date & time 12/31/13  3329 History   This chart was scribed for Eddie Sorrow, MD by Martinique Peace, ED Scribe. The patient was seen in [facility]. The patient's care was started at 10:04 AM.    Chief Complaint  Patient presents with  . Medication Refill      Patient is a 48 y.o. male presenting with back pain. The history is provided by the patient. No language interpreter was used.  Back Pain Location:  Lumbar spine Radiates to:  L thigh, R thigh, L knee, R knee, L foot and R foot Pain severity:  Severe (10/10) Associated symptoms: numbness (bilaterally down both legs)   Associated symptoms: no abdominal pain, no chest pain, no dysuria, no fever and no headaches   HPI Comments: ASHLY GOETHE is a 48 y.o. male who presents to the Emergency Department complaining of bilateral low back pain rated as 10/10. He states that he had back surgery earlier in December and has been experiencing pain intermittently since then. He also complains of associated numbness that extends bilaterally down his legs to his toes. Pt states his sister is suppose to schedule an appointment for him to meet with his doctor soon. He states that he was taking Tramadol to help with pain but is currently out of medication. He denies fever, chills, nausea, vomiting or diarrhea. He states his PCP is in [place] but does not know his name.   Past Medical History  Diagnosis Date  . Hernia   . Chronic back pain    Past Surgical History  Procedure Laterality Date  . Mandible fracture surgery    . Hernia repair    . Laminectomy  07/06/2013    T9    T10    CORD COMPRESSION  . Lumbar laminectomy/decompression microdiscectomy N/A 07/06/2013    Procedure: LUMBAR LAMINECTOMY, DECOMPRESSION  T9 to T10;  Surgeon: Consuella Lose, MD;  Location: [facility];  Service: Neurosurgery;  Laterality: N/A;  . Back surgery     Family History  Problem Relation Age of Onset  . Cancer Brother      throat   History  Substance Use Topics  . Smoking status: Current Every Day Smoker -- 1.00 packs/day for 28 years    Types: Cigarettes  . Smokeless tobacco: Never Used  . Alcohol Use: Yes     Comment:    6 PK ON WEEKENDS    Review of Systems  Constitutional: Negative for fever and chills.  HENT: Negative for sinus pressure and sore throat.   Eyes: Negative for visual disturbance.  Respiratory: Negative for cough.   Cardiovascular: Negative for chest pain.  Gastrointestinal: Negative for nausea, vomiting, abdominal pain and diarrhea.  Genitourinary: Negative for dysuria and hematuria.  Musculoskeletal: Positive for back pain. Negative for joint swelling and neck pain.  Skin: Negative for rash.  Neurological: Positive for numbness (bilaterally down both legs). Negative for headaches.  Hematological: Does not bruise/bleed easily.  Psychiatric/Behavioral: Negative for confusion.      Allergies  Review of patient's allergies indicates no known allergies.  Home Medications   Prior to Admission medications   Medication Sig Start Date End Date Taking? Authorizing Wendelin Reader  acetaminophen (TYLENOL) 325 MG tablet Take 1-2 tablets (325-650 mg total) by mouth every 4 (four) hours as needed for mild pain. 08/09/13  Yes Reyne Dumas, MD  baclofen (LIORESAL) 10 MG tablet Take 1 tablet (10 mg total) by mouth 3 (three) times daily. 08/22/13  Yes Meredith Staggers, MD  clonazePAM (KLONOPIN) 0.5 MG tablet Take 1 tablet (0.5 mg total) by mouth at bedtime as needed (insomnia). 08/09/13  Yes Reyne Dumas, MD  gabapentin (NEURONTIN) 300 MG capsule Take 1 capsule (300 mg total) by mouth 3 (three) times daily. 08/22/13  Yes Meredith Staggers, MD  oxyCODONE 10 MG TABS Take 0.5-1 tablets (5-10 mg total) by mouth every 6 (six) hours as needed for severe pain. 07/20/13  Yes Ivan Anchors Love, PA-C  traMADol (ULTRAM) 50 MG tablet Take 1 tablet (50 mg total) by mouth every 6 (six) hours as needed for moderate pain.  09/29/13  Yes Lorayne Marek, MD  oxyCODONE-acetaminophen (PERCOCET/ROXICET) 5-325 MG per tablet Take 1-2 tablets by mouth every 6 (six) hours as needed. 12/31/13   Eddie Sorrow, MD   Triage Vitals: BP 120/95  Pulse 74  Temp(Src) 97.7 F (36.5 C) (Oral)  Resp 20  Ht 5\' 8"  (1.727 m)  Wt 150 lb (68.04 kg)  BMI 22.81 kg/m2  SpO2 99% Physical Exam  Nursing note and vitals reviewed. Constitutional: He is oriented to person, place, and time. He appears well-developed and well-nourished. No distress.  HENT:  Head: Normocephalic and atraumatic.  Mouth/Throat: Oropharynx is clear and moist.  Eyes: Conjunctivae and EOM are normal.  Neck: Neck supple. No tracheal deviation present.  Cardiovascular: Normal rate and regular rhythm.   Pulmonary/Chest: Effort normal. No respiratory distress.  Abdominal: Bowel sounds are normal. There is no tenderness.  Musculoskeletal: Normal range of motion.  Neurological: He is alert and oriented to person, place, and time. No cranial nerve deficit. He exhibits normal muscle tone. Coordination normal.  Skin: Skin is warm and dry.  Psychiatric: He has a normal mood and affect. His behavior is normal.    ED Course  Procedures (including critical care time) DIAGNOSTIC STUDIES: Oxygen Saturation is 99% on room air, normal by my interpretation.    COORDINATION OF CARE: 10:10 AM- Treatment plan was discussed with patient who verbalizes understanding and agrees.    Labs Review Labs Reviewed - No data to display  Imaging Review No results found.   EKG Interpretation None     Medications  oxyCODONE-acetaminophen (PERCOCET/ROXICET) 5-325 MG per tablet 1 tablet (not administered)    MDM   Final diagnoses:  Chronic back pain    Patient with history of chronic back pain since December. Followed by primary care Dr. in the [place] area. Patient states out of his oxycodone as having increased pain. Patient states that he has a whole leg numbness  anteriorly and posteriorly all way down to the feet. Motor is working fine sensation seems to be intact. No urinary incontinence or retention. Patient will be treated with pain medicine and followup with his regular Dr. No new injury. No significant change in symptoms compared to December.  Narcotic data base was checked both in [place] and [place]. Patient last got a prescription medicines for tramadol in March.   I personally performed the services described in this documentation, which was scribed in my presence. The recorded information has been reviewed and is accurate.     Eddie Sorrow, MD 12/31/13 [place], MD 12/31/13 1021

## 2013-12-31 NOTE — ED Notes (Signed)
Patient not given ordered percocet due to him driving self home. Patient with no complaints at this time. Respirations even and unlabored. Skin warm/dry. Discharge instructions reviewed with patient at this time. Patient given opportunity to voice concerns/ask questions. Patient discharged at this time and left Emergency Department with steady gait.

## 2014-01-13 ENCOUNTER — Emergency Department (HOSPITAL_COMMUNITY)
Admission: EM | Admit: 2014-01-13 | Discharge: 2014-01-13 | Disposition: A | Discharge status: Home / Self Care | Payer: Medicaid Other | Attending: Emergency Medicine | Admitting: Emergency Medicine

## 2014-01-13 ENCOUNTER — Encounter (HOSPITAL_COMMUNITY): Payer: Self-pay | Admitting: Emergency Medicine

## 2014-01-13 DIAGNOSIS — M545 Low back pain, unspecified: Secondary | ICD-10-CM | POA: Diagnosis not present

## 2014-01-13 DIAGNOSIS — G8929 Other chronic pain: Secondary | ICD-10-CM | POA: Diagnosis not present

## 2014-01-13 DIAGNOSIS — Z8719 Personal history of other diseases of the digestive system: Secondary | ICD-10-CM | POA: Insufficient documentation

## 2014-01-13 DIAGNOSIS — M549 Dorsalgia, unspecified: Secondary | ICD-10-CM

## 2014-01-13 DIAGNOSIS — F17200 Nicotine dependence, unspecified, uncomplicated: Secondary | ICD-10-CM | POA: Diagnosis not present

## 2014-01-13 DIAGNOSIS — Z9889 Other specified postprocedural states: Secondary | ICD-10-CM | POA: Insufficient documentation

## 2014-01-13 DIAGNOSIS — Z79899 Other long term (current) drug therapy: Secondary | ICD-10-CM | POA: Insufficient documentation

## 2014-01-13 DIAGNOSIS — Z791 Long term (current) use of non-steroidal anti-inflammatories (NSAID): Secondary | ICD-10-CM | POA: Insufficient documentation

## 2014-01-13 MED ORDER — KETOROLAC TROMETHAMINE 60 MG/2ML IM SOLN
60.0000 mg | Freq: Once | INTRAMUSCULAR | Status: AC
  Administered 2014-01-13: 60 mg via INTRAMUSCULAR
  Filled 2014-01-13: qty 2

## 2014-01-13 MED ORDER — NAPROXEN 500 MG PO TABS: 500.0000 mg | ORAL_TABLET | Freq: Two times a day (BID) | ORAL | Status: DC

## 2014-01-13 NOTE — ED Notes (Signed)
Patient given discharge instruction, verbalized understand. Patient ambulatory out of the department.  

## 2014-01-13 NOTE — ED Provider Notes (Signed)
CSN: [id number]     Arrival date & time 01/13/14  0546 History   None    Chief Complaint  Patient presents with  . Back Pain     (Consider location/radiation/quality/duration/timing/severity/associated sxs/prior Treatment) HPI Comments: The patient is a 48 year old male with a history of chronic back pain. He states that he had surgery on his back in December of 2014 and has not followed up with the surgeon because he missed the appointment. He has had daily pain since that time, his pain is associated with a shooting pain down both of his legs as well as intermittent numbness of his legs. This gets worse when he sits in a sitting position or stands and climbs stairs, the pain goes away when he lays down on his back or on his side. He denies any fevers or chills nausea or vomiting and has no history of IV drug use, cancer or difficulty urinating per his report. He states that his symptoms have not changed over the last couple of months, he was given 20 Percocet at his last visit 10 days ago.   Patient is a 48 y.o. male presenting with back pain. The history is provided by the patient and medical records.  Back Pain Associated symptoms: numbness (intermittent)   Associated symptoms: no fever and no weakness     Past Medical History  Diagnosis Date  . Hernia   . Chronic back pain    Past Surgical History  Procedure Laterality Date  . Mandible fracture surgery    . Hernia repair    . Laminectomy  07/06/2013    T9    T10    CORD COMPRESSION  . Lumbar laminectomy/decompression microdiscectomy N/A 07/06/2013    Procedure: LUMBAR LAMINECTOMY, DECOMPRESSION  T9 to T10;  Surgeon: Consuella Lose, MD;  Location: [facility];  Service: Neurosurgery;  Laterality: N/A;  . Back surgery     Family History  Problem Relation Age of Onset  . Cancer Brother     throat   History  Substance Use Topics  . Smoking status: Current Every Day Smoker -- 1.00 packs/day for 28 years    Types:  Cigarettes  . Smokeless tobacco: Never Used  . Alcohol Use: Yes     Comment:    6 PK ON WEEKENDS    Review of Systems  Constitutional: Negative for fever and chills.  Cardiovascular: Negative for leg swelling.  Gastrointestinal: Negative for nausea and vomiting.       No incontinence of bowel  Genitourinary: Negative for difficulty urinating.       No incontinence or retention  Musculoskeletal: Positive for back pain. Negative for neck pain.  Skin: Negative for rash.  Neurological: Positive for numbness (intermittent). Negative for weakness.      Allergies  Review of patient's allergies indicates no known allergies.  Home Medications   Prior to Admission medications   Medication Sig Start Date End Date Taking? Authorizing Provider  acetaminophen (TYLENOL) 325 MG tablet Take 1-2 tablets (325-650 mg total) by mouth every 4 (four) hours as needed for mild pain. 08/09/13   Reyne Dumas, MD  baclofen (LIORESAL) 10 MG tablet Take 1 tablet (10 mg total) by mouth 3 (three) times daily. 08/22/13   Meredith Staggers, MD  clonazePAM (KLONOPIN) 0.5 MG tablet Take 1 tablet (0.5 mg total) by mouth at bedtime as needed (insomnia). 08/09/13   Reyne Dumas, MD  gabapentin (NEURONTIN) 300 MG capsule Take 1 capsule (300 mg total) by mouth 3 (  three) times daily. 08/22/13   Meredith Staggers, MD  naproxen (NAPROSYN) 500 MG tablet Take 1 tablet (500 mg total) by mouth 2 (two) times daily with a meal. 01/13/14   Johnna Acosta, MD  oxyCODONE 10 MG TABS Take 0.5-1 tablets (5-10 mg total) by mouth every 6 (six) hours as needed for severe pain. 07/20/13   Bary Leriche, PA-C  oxyCODONE-acetaminophen (PERCOCET/ROXICET) 5-325 MG per tablet Take 1-2 tablets by mouth every 6 (six) hours as needed. 12/31/13   Fredia Sorrow, MD  traMADol (ULTRAM) 50 MG tablet Take 1 tablet (50 mg total) by mouth every 6 (six) hours as needed for moderate pain. 09/29/13   Lorayne Marek, MD   BP 111/97  Pulse 93  Temp(Src) 97.7 F (36.5  C) (Oral)  Resp 18  Ht 5\' 8"  (1.727 m)  Wt 150 lb (68.04 kg)  BMI 22.81 kg/m2  SpO2 99% Physical Exam  Nursing note and vitals reviewed. Constitutional: He appears well-developed and well-nourished. No distress.  HENT:  Head: Normocephalic and atraumatic.  Mouth/Throat: Oropharynx is clear and moist. No oropharyngeal exudate.  Eyes: Conjunctivae and EOM are normal. Pupils are equal, round, and reactive to light. Right eye exhibits no discharge. Left eye exhibits no discharge. No scleral icterus.  Neck: Normal range of motion. Neck supple. No JVD present. No thyromegaly present.  Cardiovascular: Normal rate, regular rhythm, normal heart sounds and intact distal pulses.  Exam reveals no gallop and no friction rub.   No murmur heard. Pulmonary/Chest: Effort normal and breath sounds normal. No respiratory distress. He has no wheezes. He has no rales.  Abdominal: Soft. Bowel sounds are normal. He exhibits no distension and no mass. There is no tenderness.  No abdominal tenderness or pulsating masses palpated  Genitourinary:  Patient refuses genital exam  Musculoskeletal: Normal range of motion. He exhibits tenderness (tenderness to the paraspinal muscles of the lumbar back, well-healed surgical scar in the midline of the thoracic spine). He exhibits no edema.  No spinal tenderness  Lymphadenopathy:    He has no cervical adenopathy.  Neurological: He is alert. Coordination normal.  Slight antalgic gait, normal strength at the hip flexors, knee flexors and extensors and the ankle. Normal reflexes at the bilateral patellar tendons, normal sensation to light touch and pinprick of the bilateral lower extremities, normal grip strength of the bilateral upper extremities, normal speech.  Skin: Skin is warm and dry. No rash noted. No erythema.  Psychiatric: He has a normal mood and affect. His behavior is normal.    ED Course  Procedures (including critical care time) Labs Review Labs Reviewed -  No data to display  Imaging Review No results found.    MDM   Final diagnoses:  Chronic back pain    The patient has a benign exam, he does have chronic back pain and may need further evaluation by a surgeon. He states that he has an appointment next week and has a ride to the appointment, at this time he'll be given Toradol but no opiate medications will be prescribed given his recent opiate prescription given in the last 10 days. The patient expresses understanding. Pathologic red flags for serious back pain are found at this time.  Meds given in ED:  Medications  ketorolac (TORADOL) injection 60 mg (not administered)    New Prescriptions   NAPROXEN (NAPROSYN) 500 MG TABLET    Take 1 tablet (500 mg total) by mouth 2 (two) times daily with a meal.  Johnna Acosta, MD 01/13/14 [phone]

## 2014-01-13 NOTE — ED Notes (Signed)
Pt had back surgery in dec, did not keep MD appointment, out of pain medication. States pain is no better since surgery to med back, Pain is in low back radiating down legs

## 2014-01-19 ENCOUNTER — Encounter (HOSPITAL_COMMUNITY): Payer: Self-pay | Admitting: Emergency Medicine

## 2014-01-19 ENCOUNTER — Emergency Department (HOSPITAL_COMMUNITY)
Admission: EM | Admit: 2014-01-19 | Discharge: 2014-01-19 | Disposition: A | Discharge status: Home / Self Care | Payer: Medicaid Other | Attending: Emergency Medicine | Admitting: Emergency Medicine

## 2014-01-19 DIAGNOSIS — Z8719 Personal history of other diseases of the digestive system: Secondary | ICD-10-CM | POA: Diagnosis not present

## 2014-01-19 DIAGNOSIS — M549 Dorsalgia, unspecified: Secondary | ICD-10-CM | POA: Insufficient documentation

## 2014-01-19 DIAGNOSIS — G8929 Other chronic pain: Secondary | ICD-10-CM | POA: Insufficient documentation

## 2014-01-19 DIAGNOSIS — F17200 Nicotine dependence, unspecified, uncomplicated: Secondary | ICD-10-CM | POA: Diagnosis not present

## 2014-01-19 MED ORDER — BACLOFEN 10 MG PO TABS: 10.0000 mg | ORAL_TABLET | Freq: Three times a day (TID) | ORAL | Status: DC

## 2014-01-19 MED ORDER — GABAPENTIN 100 MG PO CAPS: 100.0000 mg | ORAL_CAPSULE | Freq: Three times a day (TID) | ORAL | Status: DC

## 2014-01-19 MED ORDER — TRAMADOL HCL 50 MG PO TABS
50.0000 mg | ORAL_TABLET | Freq: Four times a day (QID) | ORAL | Status: DC | PRN
Start: 2014-01-19 — End: 2014-01-25

## 2014-01-19 NOTE — ED Notes (Signed)
Pt c/o pain in lower back since having upper back surgery in Dec.

## 2014-01-19 NOTE — Discharge Instructions (Signed)
SEEK IMMEDIATE MEDICAL ATTENTION IF: New numbness, tingling, weakness, or problem with the use of your arms or legs.  Severe back pain not relieved with medications.  Change in bowel or bladder control.  Increasing pain in any areas of the body (such as chest or abdominal pain).  Shortness of breath, dizziness or fainting.  Nausea (feeling sick to your stomach), vomiting, fever, or sweats.  

## 2014-01-19 NOTE — ED Provider Notes (Signed)
CSN: [id number]     Arrival date & time 01/19/14  1042 History  This chart was scribed for No att. providers found,  by Stacy Gardner, ED Scribe. The patient was seen in room APFT23/APFT23 and the patient's care was started at 11:04 AM.   First MD Initiated Contact with Patient 01/19/14 1055     Chief Complaint  Patient presents with  . Back Pain     (Consider location/radiation/quality/duration/timing/severity/associated sxs/prior Treatment) Patient is a 48 y.o. male presenting with back pain. The history is provided by the patient and medical records. No language interpreter was used.  Back Pain  HPI Comments: Eddie Rocha is a 48 y.o. male who presents to the Emergency Department complaining of chronic severe lower back pain 24/7 since having upper back surgery six months ago. The pain is worse with movement. He reports having unchanged chronic weakness and numbness to his legs. No urinary incontinence. No bowel incontinence. No change in B/B function. Denies fever, chest pain, cough, and SOB. Denies rash. Denies IV use. Denies abdominal pain. Pt ran out of his pain medication and did not proceed to follow up. Denies any new trauma. Nothing seems to help.  Past Medical History  Diagnosis Date  . Hernia   . Chronic back pain    Past Surgical History  Procedure Laterality Date  . Mandible fracture surgery    . Hernia repair    . Laminectomy  07/06/2013    T9    T10    CORD COMPRESSION  . Lumbar laminectomy/decompression microdiscectomy N/A 07/06/2013    Procedure: LUMBAR LAMINECTOMY, DECOMPRESSION  T9 to T10;  Surgeon: Consuella Lose, MD;  Location: [place] [facility];  Service: Neurosurgery;  Laterality: N/A;  . Back surgery     Family History  Problem Relation Age of Onset  . Cancer Brother     throat   History  Substance Use Topics  . Smoking status: Current Every Day Smoker -- 1.00 packs/day for 28 years    Types: Cigarettes  . Smokeless tobacco: Never Used  .  Alcohol Use: Yes     Comment:    6 PK ON WEEKENDS    Review of Systems  10 Systems reviewed and all are negative for acute change except as noted in the HPI.      Allergies  Review of patient's allergies indicates no known allergies.  Home Medications   Prior to Admission medications   Medication Sig Start Date End Date Taking? Authorizing Provider  naproxen (NAPROSYN) 500 MG tablet Take 1 tablet (500 mg total) by mouth 2 (two) times daily with a meal. 01/13/14  Yes Johnna Acosta, MD  oxyCODONE-acetaminophen (PERCOCET/ROXICET) 5-325 MG per tablet Take 1-2 tablets by mouth every 6 (six) hours as needed. 12/31/13  Yes Fredia Sorrow, MD  baclofen (LIORESAL) 10 MG tablet Take 1 tablet (10 mg total) by mouth 3 (three) times daily. 01/19/14   Babette Relic, MD  gabapentin (NEURONTIN) 100 MG capsule Take 1 capsule (100 mg total) by mouth 3 (three) times daily. 01/19/14   Babette Relic, MD  traMADol (ULTRAM) 50 MG tablet Take 1 tablet (50 mg total) by mouth every 6 (six) hours as needed for severe pain. 01/19/14   Babette Relic, MD   BP 104/64  Pulse 95  Temp(Src) 98.5 F (36.9 C) (Oral)  Resp 18  Ht 5\' 8"  (1.727 m)  Wt 150 lb (68.04 kg)  BMI 22.81 kg/m2  SpO2 100% Physical Exam  Nursing  note and vitals reviewed. Constitutional:  Awake, alert, nontoxic appearance with baseline speech.  HENT:  Head: Atraumatic.  Eyes: Pupils are equal, round, and reactive to light. Right eye exhibits no discharge. Left eye exhibits no discharge.  Neck: Neck supple.  Cardiovascular: Normal rate and regular rhythm.  Exam reveals no gallop and no friction rub.   No murmur heard. Pulmonary/Chest: Effort normal and breath sounds normal. No respiratory distress. He has no wheezes. He has no rales. He exhibits no tenderness.  Abdominal: Soft. Bowel sounds are normal. He exhibits no mass. There is no tenderness. There is no rebound.  Musculoskeletal:       Thoracic back: He exhibits no tenderness.        Lumbar back: He exhibits no tenderness.  Bilateral lower extremities non tender without new rashes or color change, baseline ROM with intact DP pulses, CR<2 secs all digits bilaterally, sensation baseline light touch bilaterally for pt, DTR's symmetric and intact bilaterally KJ / AJ, a few beats of clonus bilateral ankles as previously documented, motor symmetric bilateral 5 / 5 hip flexion, quadriceps, hamstrings, EHL, foot dorsiflexion, foot plantarflexion, gait somewhat antalgic but without apparent new ataxia. Diffuse thoracolumbar back tenderness without rash.   Neurological:  Mental status baseline for patient.  Upper extremity motor strength and sensation intact and symmetric bilaterally.  Skin: No rash noted.  Psychiatric: He has a normal mood and affect.    ED Course  Procedures (including critical care time) DIAGNOSTIC STUDIES: Oxygen Saturation is 100% on room air, normal by my interpretation.    COORDINATION OF CARE:  11:07 AM Discussed course of care with pt .Advised pt to follow up with pain clinic.  Pt understands and agrees.   Labs Review Labs Reviewed - No data to display  Imaging Review No results found.   EKG Interpretation None      MDM   Final diagnoses:  Chronic back pain    I personally performed the services described in this documentation, which was scribed in my presence. The recorded information has been reviewed and is accurate.  I doubt any other EMC precluding discharge at this time including, but not necessarily limited to the following:SBI, cauda equina syndrome.   Babette Relic, MD 01/20/14 1710

## 2014-01-25 ENCOUNTER — Encounter (HOSPITAL_COMMUNITY): Payer: Self-pay | Admitting: Emergency Medicine

## 2014-01-25 ENCOUNTER — Emergency Department (HOSPITAL_COMMUNITY)
Admission: EM | Admit: 2014-01-25 | Discharge: 2014-01-25 | Disposition: A | Discharge status: Home / Self Care | Payer: Medicaid Other | Attending: Emergency Medicine | Admitting: Emergency Medicine

## 2014-01-25 DIAGNOSIS — F17200 Nicotine dependence, unspecified, uncomplicated: Secondary | ICD-10-CM | POA: Diagnosis not present

## 2014-01-25 DIAGNOSIS — G8929 Other chronic pain: Secondary | ICD-10-CM | POA: Insufficient documentation

## 2014-01-25 DIAGNOSIS — M545 Low back pain, unspecified: Secondary | ICD-10-CM | POA: Insufficient documentation

## 2014-01-25 DIAGNOSIS — Z8719 Personal history of other diseases of the digestive system: Secondary | ICD-10-CM | POA: Diagnosis not present

## 2014-01-25 DIAGNOSIS — Z79899 Other long term (current) drug therapy: Secondary | ICD-10-CM | POA: Diagnosis not present

## 2014-01-25 DIAGNOSIS — Z9889 Other specified postprocedural states: Secondary | ICD-10-CM | POA: Diagnosis not present

## 2014-01-25 DIAGNOSIS — Z791 Long term (current) use of non-steroidal anti-inflammatories (NSAID): Secondary | ICD-10-CM | POA: Diagnosis not present

## 2014-01-25 DIAGNOSIS — M549 Dorsalgia, unspecified: Secondary | ICD-10-CM

## 2014-01-25 MED ORDER — MELOXICAM 7.5 MG PO TABS: 7.5000 mg | ORAL_TABLET | Freq: Every day | ORAL | Status: DC

## 2014-01-25 MED ORDER — KETOROLAC TROMETHAMINE 60 MG/2ML IM SOLN
60.0000 mg | Freq: Once | INTRAMUSCULAR | Status: AC
  Administered 2014-01-25: 60 mg via INTRAMUSCULAR
  Filled 2014-01-25: qty 2

## 2014-01-25 NOTE — ED Notes (Signed)
Pt with chronic back pain. Reports he ran out of pain meds. Had back sx in December. Has not contacted his neurosurgeon.

## 2014-01-25 NOTE — Discharge Instructions (Signed)
Chronic Back Pain  When back pain lasts longer than 3 months, it is called chronic back pain.People with chronic back pain often go through certain periods that are more intense (flare-ups).  CAUSES Chronic back pain can be caused by wear and tear (degeneration) on different structures in your back. These structures include:  The bones of your spine (vertebrae) and the joints surrounding your spinal cord and nerve roots (facets).  The strong, fibrous tissues that connect your vertebrae (ligaments). Degeneration of these structures may result in pressure on your nerves. This can lead to constant pain. HOME CARE INSTRUCTIONS  Avoid bending, heavy lifting, prolonged sitting, and activities which make the problem worse.  Take brief periods of rest throughout the day to reduce your pain. Lying down or standing usually is better than sitting while you are resting.  Take over-the-counter or prescription medicines only as directed by your caregiver. SEEK IMMEDIATE MEDICAL CARE IF:   You have weakness or numbness in one of your legs or feet.  You have trouble controlling your bladder or bowels.  You have nausea, vomiting, abdominal pain, shortness of breath, or fainting. Document Released: 07/31/2004 Document Revised: 09/15/2011 Document Reviewed: 06/07/2011 [facility] Patient Information 2015 [place], [place]. This information is not intended to replace advice given to you by your health care provider. Make sure you discuss any questions you have with your health care provider.   Continue taking your other home medications.  Followup with Dr. Tessa Lerner.  If transportation becomes an ongoing issue you may want to consider seeing Dr. Merlene Laughter here in  [place] for chronic pain management.

## 2014-01-25 NOTE — ED Notes (Signed)
Had back sx in December 2014 for upper back.  Presently having pain in back.  Ran out of Tramadol.  Tramadol is not helping with pain.  Pt says, "my leg moved by itself, it wasn't a cramp."

## 2014-01-26 ENCOUNTER — Telehealth: Payer: Self-pay

## 2014-01-26 NOTE — ED Provider Notes (Signed)
CSN: [id number]     Arrival date & time 01/25/14  1000 History   First MD Initiated Contact with Patient 01/25/14 1046     Chief Complaint  Patient presents with  . Back Pain     (Consider location/radiation/quality/duration/timing/severity/associated sxs/prior Treatment) The history is provided by the patient.    Eddie Rocha is a 48 y.o. male presenting with chronic low back pain which has which has been present for the past 6 months since he had surgical repair of a ruptured thoracic disk.   Patient denies any new injury specifically.  He denies weakness but reports chronic numbness in lower extremities which is not new or different and no urinary or bowel retention or incontinence.  Patient does not have a history of cancer or IVDU.  He reports he has chronic pain management in [place] but has difficulty keeping his appointments due to lack of transportation.  He was given medications last week here for this same complaint.  He reports he still has gabapentin and baclofen from that visit, but has run out of his tramadol which he reports was not working.  He is not currently on any anti inflammatories.     Past Medical History  Diagnosis Date  . Hernia   . Chronic back pain    Past Surgical History  Procedure Laterality Date  . Mandible fracture surgery    . Hernia repair    . Laminectomy  07/06/2013    T9    T10    CORD COMPRESSION  . Lumbar laminectomy/decompression microdiscectomy N/A 07/06/2013    Procedure: LUMBAR LAMINECTOMY, DECOMPRESSION  T9 to T10;  Surgeon: Consuella Lose, MD;  Location: [place] [facility];  Service: Neurosurgery;  Laterality: N/A;  . Back surgery     Family History  Problem Relation Age of Onset  . Cancer Brother     throat   History  Substance Use Topics  . Smoking status: Current Every Day Smoker -- 1.00 packs/day for 28 years    Types: Cigarettes  . Smokeless tobacco: Never Used  . Alcohol Use: Yes     Comment:    6 PK ON WEEKENDS     Review of Systems  Constitutional: Negative for fever.  Respiratory: Negative for shortness of breath.   Cardiovascular: Negative for chest pain and leg swelling.  Gastrointestinal: Negative for abdominal pain, constipation and abdominal distention.  Genitourinary: Negative for dysuria, urgency, frequency, flank pain and difficulty urinating.  Musculoskeletal: Positive for back pain. Negative for gait problem and joint swelling.  Skin: Negative for rash.  Neurological: Positive for numbness. Negative for weakness.      Allergies  Review of patient's allergies indicates no known allergies.  Home Medications   Prior to Admission medications   Medication Sig Start Date End Date Taking? Authorizing Provider  baclofen (LIORESAL) 10 MG tablet Take 1 tablet (10 mg total) by mouth 3 (three) times daily. 01/19/14  Yes Babette Relic, MD  gabapentin (NEURONTIN) 100 MG capsule Take 1 capsule (100 mg total) by mouth 3 (three) times daily. 01/19/14  Yes Babette Relic, MD  ibuprofen (ADVIL,MOTRIN) 200 MG tablet Take 200 mg by mouth daily as needed for moderate pain.   Yes Historical Provider, MD  meloxicam (MOBIC) 7.5 MG tablet Take 1 tablet (7.5 mg total) by mouth daily. 01/25/14   Evalee Jefferson, PA-C   BP 136/82  Pulse 82  Temp(Src) 98.3 F (36.8 C) (Oral)  Ht 5\' 8"  (1.727 m)  Wt 150 lb (  68.04 kg)  BMI 22.81 kg/m2  SpO2 100% Physical Exam  Nursing note and vitals reviewed. Constitutional: He appears well-developed and well-nourished.  HENT:  Head: Normocephalic.  Eyes: Conjunctivae are normal.  Neck: Normal range of motion. Neck supple.  Cardiovascular: Normal rate and intact distal pulses.   Pedal pulses normal.  Pulmonary/Chest: Effort normal.  Abdominal: Soft. Bowel sounds are normal. He exhibits no distension and no mass.  Musculoskeletal: Normal range of motion. He exhibits no edema.       Lumbar back: He exhibits no tenderness, no swelling, no edema and no spasm.  Midline and  parathoracic tenderness.  Well healed midline incision.  Neurological: He is alert. He has normal strength. He displays no atrophy and no tremor. No sensory deficit.  Reflex Scores:      Patellar reflexes are 2+ on the right side and 2+ on the left side. No strength deficit noted in hip and knee flexor and extensor muscle groups.  Ankle flexion and extension intact. Slight antalgic gait, no ataxia.  Skin: Skin is warm and dry.  Psychiatric: He has a normal mood and affect.    ED Course  Procedures (including critical care time) Labs Review Labs Reviewed - No data to display  Imaging Review No results found.   EKG Interpretation None      MDM   Final diagnoses:  Chronic back pain    Pt advised he needs to obtain his pain medicines from his pcp and/or his chronic pain specialist.  Suggested he may need a closer pain specialist if transportation was an issue.  Suggested Dr. Merlene Laughter for pain management, referral given.  He was given toradol injection, mobic prescribed.  Pt to continue taking baclofen, gabapentin.  No neuro deficit on exam or by history to suggest emergent or surgical presentation.  Also discussed worsened sx that should prompt immediate re-evaluation including distal weakness, bowel/bladder retention/incontinence.         Evalee Jefferson, PA-C 01/26/14 1450

## 2014-01-26 NOTE — ED Provider Notes (Signed)
Medical screening examination/treatment/procedure(s) were performed by non-physician practitioner and as supervising physician I was immediately available for consultation/collaboration.   EKG Interpretation None        Fredia Sorrow, MD 01/26/14 [phone]

## 2014-01-26 NOTE — Telephone Encounter (Signed)
Eddie Rocha called on behave of patient.  She says he had trouble keeping his appointments due to transportation.  She has resolved this issue but cannot get the patient in until September.  She says patient is in a lot of pain and would like to know if there is something that can be done before he is seen.  Please advise.

## 2014-01-27 MED ORDER — TRAMADOL HCL 50 MG PO TABS: 50.0000 mg | ORAL_TABLET | Freq: Two times a day (BID) | ORAL | Status: DC | PRN

## 2014-01-27 NOTE — Telephone Encounter (Signed)
Can have tramadol, 50mg  q12 prn #60 with 1rf.  That is last med I will rx without a visit

## 2014-01-27 NOTE — Telephone Encounter (Signed)
Tramadol RX called in at pharmacy. Attempted to contact patient to inform him that the RX was called in, and per Dr. Naaman Plummer he will not RX anymore meds without a visit. Left a voicemail to return call to clinic.

## 2014-01-30 ENCOUNTER — Telehealth: Payer: Self-pay | Admitting: *Deleted

## 2014-01-30 NOTE — Telephone Encounter (Signed)
Cherlyn Cushing called from [facility] about Eddie Rocha wondering if Dr Naaman Plummer could prescribe his pain medication until he sees him in September.  His appt is 03/17/14 and we called in an order for tramadol 01/27/14 with one refill which should cover him until his appt.  Left VM on [name] verified VM.

## 2014-01-31 ENCOUNTER — Ambulatory Visit: Payer: Medicaid Other | Attending: Internal Medicine | Admitting: Internal Medicine

## 2014-01-31 ENCOUNTER — Encounter: Payer: Self-pay | Admitting: Internal Medicine

## 2014-01-31 VITALS — BP 148/82 | HR 74 | Temp 98.8°F | Resp 16 | Wt 154.0 lb

## 2014-01-31 DIAGNOSIS — F17200 Nicotine dependence, unspecified, uncomplicated: Secondary | ICD-10-CM | POA: Diagnosis not present

## 2014-01-31 DIAGNOSIS — G8929 Other chronic pain: Secondary | ICD-10-CM | POA: Insufficient documentation

## 2014-01-31 DIAGNOSIS — Z79899 Other long term (current) drug therapy: Secondary | ICD-10-CM | POA: Diagnosis not present

## 2014-01-31 DIAGNOSIS — M549 Dorsalgia, unspecified: Secondary | ICD-10-CM | POA: Diagnosis not present

## 2014-01-31 MED ORDER — BACLOFEN 10 MG PO TABS: 10.0000 mg | ORAL_TABLET | Freq: Three times a day (TID) | ORAL | Status: DC

## 2014-01-31 MED ORDER — ACETAMINOPHEN-CODEINE #3 300-30 MG PO TABS: 1.0000 | ORAL_TABLET | Freq: Three times a day (TID) | ORAL | Status: DC | PRN

## 2014-01-31 MED ORDER — GABAPENTIN 100 MG PO CAPS: 100.0000 mg | ORAL_CAPSULE | Freq: Three times a day (TID) | ORAL | Status: DC

## 2014-01-31 NOTE — Progress Notes (Signed)
Patient here for follow up on his back pain States tramadol is not helping anymore at this point

## 2014-01-31 NOTE — Progress Notes (Signed)
MRN: [id number] Name: Eddie Rocha  Sex: male Age: 48 y.o. DOB: 1966-01-09  Allergies: Review of patient's allergies indicates no known allergies.  Chief Complaint  Patient presents with  . Follow-up    back pain    HPI: Patient is 48 y.o. male who has to of chronic back pain currently following up with the physical medicine and  rehabilitation, patient also recently went to the emergency room and was prescribed [facility] as per patient none of the medication is working currently is taking Neurontin baclofen, tramadol the medications which were prescribed to him in the past patient is requesting some other medication. As per patient he has not filled in prescription for tramadol and does not want  this medication as it does not help him with the symptoms.  Past Medical History  Diagnosis Date  . Hernia   . Chronic back pain     Past Surgical History  Procedure Laterality Date  . Mandible fracture surgery    . Hernia repair    . Laminectomy  07/06/2013    T9    T10    CORD COMPRESSION  . Lumbar laminectomy/decompression microdiscectomy N/A 07/06/2013    Procedure: LUMBAR LAMINECTOMY, DECOMPRESSION  T9 to T10;  Surgeon: Consuella Lose, MD;  Location: [place] [facility];  Service: Neurosurgery;  Laterality: N/A;  . Back surgery        Medication List       This list is accurate as of: 01/31/14  9:43 AM.  Always use your most recent med list.               acetaminophen-codeine 300-30 MG per tablet  Commonly known as:  TYLENOL #3  Take 1 tablet by mouth every 8 (eight) hours as needed for moderate pain.     baclofen 10 MG tablet  Commonly known as:  LIORESAL  Take 1 tablet (10 mg total) by mouth 3 (three) times daily.     gabapentin 100 MG capsule  Commonly known as:  NEURONTIN  Take 1 capsule (100 mg total) by mouth 3 (three) times daily.     ibuprofen 200 MG tablet  Commonly known as:  ADVIL,MOTRIN  Take 200 mg by mouth daily as needed for moderate pain.     meloxicam 7.5 MG tablet  Commonly known as:  MOBIC  Take 1 tablet (7.5 mg total) by mouth daily.     traMADol 50 MG tablet  Commonly known as:  ULTRAM  Take 1 tablet (50 mg total) by mouth every 12 (twelve) hours as needed.        Meds ordered this encounter  Medications  . acetaminophen-codeine (TYLENOL #3) 300-30 MG per tablet    Sig: Take 1 tablet by mouth every 8 (eight) hours as needed for moderate pain.    Dispense:  30 tablet    Refill:  0     There is no immunization history on file for this patient.  Family History  Problem Relation Age of Onset  . Cancer Brother     throat    History  Substance Use Topics  . Smoking status: Current Every Day Smoker -- 1.00 packs/day for 28 years    Types: Cigarettes  . Smokeless tobacco: Never Used  . Alcohol Use: Yes     Comment:    6 PK ON WEEKENDS    Review of Systems   As noted in HPI  Filed Vitals:   01/31/14 0915  BP: 148/82  Pulse: 74  Temp: 98.8 F (37.1 C)  Resp: 16    Physical Exam  Physical Exam  Constitutional: No distress.  Cardiovascular: Normal rate and regular rhythm.   Pulmonary/Chest: Breath sounds normal. No respiratory distress. He has no wheezes. He has no rales.  Musculoskeletal:  Upper back midline surgical scar, paraspinal tenderness     CBC    Component Value Date/Time   WBC 5.9 07/13/2013 0500   RBC 4.18* 07/13/2013 0500   HGB 13.3 07/13/2013 0500   HCT 39.9 07/13/2013 0500   PLT 306 07/13/2013 0500   MCV 95.5 07/13/2013 0500   LYMPHSABS 1.6 07/13/2013 0500   MONOABS 0.7 07/13/2013 0500   EOSABS 0.6 07/13/2013 0500   BASOSABS 0.0 07/13/2013 0500    CMP     Component Value Date/Time   NA 137 07/13/2013 0500   K 4.7 07/13/2013 0500   CL 96 07/13/2013 0500   CO2 25 07/13/2013 0500   GLUCOSE 91 07/13/2013 0500   BUN 21 07/13/2013 0500   CREATININE 0.94 07/19/2013 0600   CALCIUM 9.8 07/13/2013 0500   PROT 7.8 07/13/2013 0500   ALBUMIN 3.4* 07/13/2013 0500   AST 15 07/13/2013 0500   ALT 13 07/13/2013 0500     ALKPHOS 58 07/13/2013 0500   BILITOT 0.2* 07/13/2013 0500   GFRNONAA >90 07/19/2013 0600   GFRAA >90 07/19/2013 0600    No results found for this basename: chol, tri, ldl    No components found with this basename: hga1c    Lab Results  Component Value Date/Time   AST 15 07/13/2013  5:00 AM    Assessment and Plan  Chronic back pain - Plan: I have prescribed him acetaminophen-codeine (TYLENOL #3) 300-30 MG per tablet when necessary for pain until he seen by physical medicine and rehabilitation for further management.  Lorayne Marek, MD

## 2014-03-17 ENCOUNTER — Other Ambulatory Visit: Payer: Self-pay

## 2014-03-17 ENCOUNTER — Encounter: Payer: Self-pay | Admitting: Physical Medicine & Rehabilitation

## 2014-03-17 ENCOUNTER — Encounter: Payer: Medicaid Other | Attending: Physical Medicine & Rehabilitation | Admitting: Physical Medicine & Rehabilitation

## 2014-03-17 VITALS — BP 130/85 | HR 76 | Resp 14 | Ht 68.0 in | Wt 150.0 lb

## 2014-03-17 DIAGNOSIS — M792 Neuralgia and neuritis, unspecified: Secondary | ICD-10-CM

## 2014-03-17 DIAGNOSIS — F17200 Nicotine dependence, unspecified, uncomplicated: Secondary | ICD-10-CM | POA: Insufficient documentation

## 2014-03-17 DIAGNOSIS — M5104 Intervertebral disc disorders with myelopathy, thoracic region: Secondary | ICD-10-CM | POA: Insufficient documentation

## 2014-03-17 DIAGNOSIS — G8929 Other chronic pain: Secondary | ICD-10-CM

## 2014-03-17 DIAGNOSIS — M549 Dorsalgia, unspecified: Secondary | ICD-10-CM

## 2014-03-17 DIAGNOSIS — S24109S Unspecified injury at unspecified level of thoracic spinal cord, sequela: Secondary | ICD-10-CM

## 2014-03-17 DIAGNOSIS — IMO0002 Reserved for concepts with insufficient information to code with codable children: Secondary | ICD-10-CM

## 2014-03-17 MED ORDER — NAPROXEN 500 MG PO TABS: 500.0000 mg | ORAL_TABLET | Freq: Two times a day (BID) | ORAL | Status: DC

## 2014-03-17 NOTE — Progress Notes (Signed)
Subjective:    Patient ID: Eddie Rocha, male    DOB: 11-05-1965, 47 y.o.   MRN: [id number]  HPI   Eddie Rocha is back regarding his chronic back pain. He states that his mid back still feels like there is knife stabbing through it.  I haven't seen him since January. He hasn't followed up with his surgeon. He is using tylenol for pain. He also is smoking weed. He has tested positive for cocaine in the past as well.       Pain Inventory Average Pain 9 Pain Right Now 8 My pain is constant, sharp, burning, stabbing, tingling and aching  In the last 24 hours, has pain interfered with the following? General activity 8 Relation with others 3 Enjoyment of life 3 What TIME of day is your pain at its worst? constant all day Sleep (in general) Poor  Pain is worse with: walking, bending and standing Pain improves with: injections Relief from Meds: 0  Mobility walk with assistance use a cane how many minutes can you walk? 2 do you drive?  no transfers alone  Function disabled: date disabled 07/03/2013 I need assistance with the following:  household duties and shopping  Neuro/Psych weakness numbness tingling trouble walking depression anxiety  Prior Studies Any changes since last visit?  no  Physicians involved in your care Any changes since last visit?  no   Family History  Problem Relation Age of Onset  . Cancer Brother     throat   History   Social History  . Marital Status: Legally Separated    Spouse Name: N/A    Number of Children: N/A  . Years of Education: N/A   Social History Main Topics  . Smoking status: Current Every Day Smoker -- 1.00 packs/day for 28 years    Types: Cigarettes  . Smokeless tobacco: Never Used  . Alcohol Use: Yes     Comment:    6 PK ON WEEKENDS  . Drug Use: No  . Sexual Activity: No   Other Topics Concern  . None   Social History Narrative  . None   Past Surgical History  Procedure Laterality Date  .  Mandible fracture surgery    . Hernia repair    . Laminectomy  07/06/2013    T9    T10    CORD COMPRESSION  . Lumbar laminectomy/decompression microdiscectomy N/A 07/06/2013    Procedure: LUMBAR LAMINECTOMY, DECOMPRESSION  T9 to T10;  Surgeon: Consuella Lose, MD;  Location: [place] [facility];  Service: Neurosurgery;  Laterality: N/A;  . Back surgery     Past Medical History  Diagnosis Date  . Hernia   . Chronic back pain    BP 130/85  Pulse 76  Resp 14  Ht 5\' 8"  (1.727 m)  Wt 150 lb (68.04 kg)  BMI 22.81 kg/m2  SpO2 97%  Opioid Risk Score:   Fall Risk Score: Moderate Fall Risk (6-13 points) (pt educated declined handout)    Review of Systems  Constitutional: Positive for appetite change.  Musculoskeletal: Positive for back pain and gait problem.  Neurological: Positive for weakness and numbness.       Tingling  Psychiatric/Behavioral: The patient is nervous/anxious.        Depression  All other systems reviewed and are negative.      Objective:   Physical Exam   Constitutional: He is oriented to person, place, and time. He appears well-developed and well-nourished.  HENT: oral mucosa irritated along lower  teeth, no frank ulceration  Head: Normocephalic and atraumatic.  Eyes: Conjunctivae are normal. Pupils are equal, round, and reactive to light.  Neck: Normal range of motion. Neck supple.  Cardiovascular: Normal rate and regular rhythm.  No murmur heard.  Respiratory: Effort normal and breath sounds normal. No respiratory distress. He has no wheezes.  GI: Soft. Bowel sounds are normal. He exhibits no distension. There is no tenderness.  Musculoskeletal: diffuse paraspinal tenderness, right more than left from the mid thoracic spine to the lumbar spine. Pain worse with palpation.  Neurological: He is alert and oriented to person, place, and time.  Decreased PP and LT below the umbilicus (1+/2). LE grossly 4+/5 proximal to distal with left leg stronger than right.  DTR's 3+. Gait generally stable. Did lose balance once when changing directions. He uses a cane for balance. UE's normal.  Skin: Skin is warm and dry.  thoracic incision healed--area sensitive to touch.  Psychiatric: he remains irritable.    Assessment/Plan:  1. 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 Pain Management: continue gabapentin, 300mg  tid  -  baclofen to 10mg  tid  -naproxen 500mg  bid with food -pt refuses tramadol ("doesnt work")---asked for something stronger---actively using illicit drugs including marijuana. Did not want to go through with UDS -pt became frustrated with me when I did not offer him anything beyond ultram.  -at this point, I have nothing more to offer this man, as it appears to me that he's drug seeking.  Follow up PRN.

## 2014-03-17 NOTE — Patient Instructions (Signed)
Contact Dr. Kathyrn Sheriff ---neurosurgeon regarding a follow up visit for your back

## 2014-05-27 ENCOUNTER — Encounter (HOSPITAL_COMMUNITY): Payer: Self-pay | Admitting: *Deleted

## 2014-05-27 ENCOUNTER — Emergency Department (HOSPITAL_COMMUNITY)
Admission: EM | Admit: 2014-05-27 | Discharge: 2014-05-27 | Disposition: A | Discharge status: Home / Self Care | Payer: Medicaid Other | Attending: Emergency Medicine | Admitting: Emergency Medicine

## 2014-05-27 DIAGNOSIS — Z720 Tobacco use: Secondary | ICD-10-CM | POA: Diagnosis not present

## 2014-05-27 DIAGNOSIS — M545 Low back pain: Secondary | ICD-10-CM | POA: Diagnosis present

## 2014-05-27 DIAGNOSIS — Z79899 Other long term (current) drug therapy: Secondary | ICD-10-CM | POA: Diagnosis not present

## 2014-05-27 DIAGNOSIS — M549 Dorsalgia, unspecified: Secondary | ICD-10-CM

## 2014-05-27 DIAGNOSIS — G8929 Other chronic pain: Secondary | ICD-10-CM | POA: Insufficient documentation

## 2014-05-27 MED ORDER — IBUPROFEN 800 MG PO TABS
800.0000 mg | ORAL_TABLET | Freq: Once | ORAL | Status: AC
  Administered 2014-05-27: 800 mg via ORAL
  Filled 2014-05-27: qty 1

## 2014-05-27 NOTE — Discharge Instructions (Signed)

## 2014-05-27 NOTE — ED Provider Notes (Signed)
CSN: [id number]     Arrival date & time 05/27/14  0505 History   First MD Initiated Contact with Patient 05/27/14 [phone]     Chief Complaint  Patient presents with  . Back Pain      Patient is a 48 y.o. male presenting with back pain. The history is provided by the patient.  Back Pain Location:  Lumbar spine Quality:  Aching Radiates to:  Does not radiate Pain severity:  Moderate Pain is:  Same all the time Onset quality:  Gradual Duration: several months. Timing:  Constant Progression:  Worsening Chronicity:  Chronic Relieved by:  Nothing Worsened by:  Movement Associated symptoms: no abdominal pain, no bladder incontinence, no bowel incontinence, no chest pain, no dysuria, no fever and no weakness   Risk factors: no hx of cancer    Patient reports having chronic back pain daily since his back surgery last year He reports he has pain everyday No new injury/fall No new weakness/numbness in his legs No fever/vomiting No h/o IVDA He reports his chronic pain worsened tonight  He reports he has pain specialist but can not recall any physicians names at this time Past Medical History  Diagnosis Date  . Hernia   . Chronic back pain    Past Surgical History  Procedure Laterality Date  . Mandible fracture surgery    . Hernia repair    . Laminectomy  07/06/2013    T9    T10    CORD COMPRESSION  . Lumbar laminectomy/decompression microdiscectomy N/A 07/06/2013    Procedure: LUMBAR LAMINECTOMY, DECOMPRESSION  T9 to T10;  Surgeon: Consuella Lose, MD;  Location: [place] [facility];  Service: Neurosurgery;  Laterality: N/A;  . Back surgery     Family History  Problem Relation Age of Onset  . Cancer Brother     throat   History  Substance Use Topics  . Smoking status: Current Every Day Smoker -- 1.00 packs/day for 28 years    Types: Cigarettes  . Smokeless tobacco: Never Used  . Alcohol Use: Yes     Comment:    6 PK ON WEEKENDS    Review of Systems  Constitutional:  Negative for fever.  Respiratory: Negative for cough.   Cardiovascular: Negative for chest pain.  Gastrointestinal: Negative for vomiting, abdominal pain and bowel incontinence.  Genitourinary: Negative for bladder incontinence and dysuria.  Musculoskeletal: Positive for back pain.  Neurological: Negative for weakness.  All other systems reviewed and are negative.     Allergies  Review of patient's allergies indicates no known allergies.  Home Medications   Prior to Admission medications   Medication Sig Start Date End Date Taking? Authorizing Provider  gabapentin (NEURONTIN) 100 MG capsule Take 1 capsule (100 mg total) by mouth 3 (three) times daily. 01/31/14   Lorayne Marek, MD   BP 122/88 mmHg  Pulse 86  Temp(Src) 98.7 F (37.1 C) (Oral)  Resp 20  Ht 5\' 8"  (1.727 m)  Wt 150 lb (68.04 kg)  BMI 22.81 kg/m2  SpO2 99% Physical Exam CONSTITUTIONAL: Well developed/well nourished HEAD: Normocephalic/atraumatic EYES: EOMI/PERRL ENMT: Mucous membranes moist NECK: supple no meningeal signs SPINE/BACK: well healed incision to thoracic spine.  It is nontender.  No erythema/edema noted He has focal tenderness to lumbar paraspinal region.  No bruising/crepitance/stepoffs noted to spine CV: S1/S2 noted, no murmurs/rubs/gallops noted LUNGS: Lungs are clear to auscultation bilaterally, no apparent distress ABDOMEN: soft, nontender, no rebound or guarding GU:no cva tenderness NEURO: Awake/alert,equal motor 5/5 strength noted  with the following: hip flexion/knee flexion/extension, foot dorsi/plantar flexion, great toe extension intact bilaterally, no clonus bilaterally, no sensory deficit in any dermatome.  Equal patellar/achilles reflex noted (2+) in bilateral lower extremities.  Pt is able to ambulate unassisted. EXTREMITIES: pulses normal, full ROM SKIN: warm, color normal PSYCH: no abnormalities of mood noted, alert and oriented to situation   ED Course  Procedures   MDM  5:46  AM Pt with chronic daily back pain since surgery in 2014 He has no acute focal weakness in his lower extremities.  He can ambulate  I reviewed notes from physical medicine Dr Naaman Plummer.  In September 2015 there was suspicion for drug seeking behavior.  This appears to be chronic in nature and advised need to have chronic pain managed outside of ED as outpatient.  It appears his strength is improved since prior examinations.  He is clinically stable/appopriate for d/c home  Final diagnoses:  Chronic back pain    Previous records reviewed and considered Nursing notes including past medical history and social history reviewed and considered in documentation Narcotic database reviewed and considered in decision making     Sharyon Cable, MD 05/27/14 [phone]

## 2014-05-27 NOTE — ED Notes (Addendum)
Pt reporting history of chronic back pain.  States pain increased tonight.  Pt drove self to hospital.

## 2014-08-01 ENCOUNTER — Encounter (HOSPITAL_COMMUNITY): Payer: Self-pay

## 2014-08-01 ENCOUNTER — Emergency Department (HOSPITAL_COMMUNITY): Payer: Medicaid Other

## 2014-08-01 ENCOUNTER — Emergency Department (HOSPITAL_COMMUNITY)
Admission: EM | Admit: 2014-08-01 | Discharge: 2014-08-01 | Disposition: A | Discharge status: Home / Self Care | Payer: Medicaid Other | Attending: Emergency Medicine | Admitting: Emergency Medicine

## 2014-08-01 DIAGNOSIS — S4991XA Unspecified injury of right shoulder and upper arm, initial encounter: Secondary | ICD-10-CM | POA: Diagnosis not present

## 2014-08-01 DIAGNOSIS — S8252XA Displaced fracture of medial malleolus of left tibia, initial encounter for closed fracture: Secondary | ICD-10-CM | POA: Insufficient documentation

## 2014-08-01 DIAGNOSIS — S82432A Displaced oblique fracture of shaft of left fibula, initial encounter for closed fracture: Secondary | ICD-10-CM | POA: Diagnosis not present

## 2014-08-01 DIAGNOSIS — W19XXXA Unspecified fall, initial encounter: Secondary | ICD-10-CM

## 2014-08-01 DIAGNOSIS — Y9389 Activity, other specified: Secondary | ICD-10-CM | POA: Diagnosis not present

## 2014-08-01 DIAGNOSIS — Y92008 Other place in unspecified non-institutional (private) residence as the place of occurrence of the external cause: Secondary | ICD-10-CM | POA: Diagnosis not present

## 2014-08-01 DIAGNOSIS — Z79899 Other long term (current) drug therapy: Secondary | ICD-10-CM | POA: Diagnosis not present

## 2014-08-01 DIAGNOSIS — W1839XA Other fall on same level, initial encounter: Secondary | ICD-10-CM | POA: Diagnosis not present

## 2014-08-01 DIAGNOSIS — Y998 Other external cause status: Secondary | ICD-10-CM | POA: Diagnosis not present

## 2014-08-01 DIAGNOSIS — Z720 Tobacco use: Secondary | ICD-10-CM | POA: Diagnosis not present

## 2014-08-01 DIAGNOSIS — Z8719 Personal history of other diseases of the digestive system: Secondary | ICD-10-CM | POA: Insufficient documentation

## 2014-08-01 DIAGNOSIS — M25511 Pain in right shoulder: Secondary | ICD-10-CM

## 2014-08-01 DIAGNOSIS — G8929 Other chronic pain: Secondary | ICD-10-CM | POA: Diagnosis not present

## 2014-08-01 DIAGNOSIS — S82402A Unspecified fracture of shaft of left fibula, initial encounter for closed fracture: Secondary | ICD-10-CM

## 2014-08-01 MED ORDER — HYDROCODONE-ACETAMINOPHEN 5-325 MG PO TABS: ORAL_TABLET | ORAL | Status: DC

## 2014-08-01 NOTE — ED Provider Notes (Signed)
CSN: [id number]     Arrival date & time 08/01/14  5726 History   First MD Initiated Contact with Patient 08/01/14 0831     Chief Complaint  Patient presents with  . Fall      HPI Pt was seen at [facility]. Per pt, c/o sudden onset and resolution of one episode of slip and fall that occurred last night approximately 1800. Pt c/o right shoulder and left ankle "pain." Pt has been ambulatory since the fall. Denies hitting head, no LOC, no neck pain, no change in his chronic back pain, no CP/SOB, no abd pain, no focal motor weakness, no tingling/numbness in extremities.    Past Medical History  Diagnosis Date  . Hernia   . Chronic back pain    Past Surgical History  Procedure Laterality Date  . Mandible fracture surgery    . Hernia repair    . Laminectomy  07/06/2013    T9    T10    CORD COMPRESSION  . Lumbar laminectomy/decompression microdiscectomy N/A 07/06/2013    Procedure: LUMBAR LAMINECTOMY, DECOMPRESSION  T9 to T10;  Surgeon: Consuella Lose, MD;  Location: [place] [facility];  Service: Neurosurgery;  Laterality: N/A;  . Back surgery     Family History  Problem Relation Age of Onset  . Cancer Brother     throat   History  Substance Use Topics  . Smoking status: Current Every Day Smoker -- 1.00 packs/day for 28 years    Types: Cigarettes  . Smokeless tobacco: Never Used  . Alcohol Use: Yes     Comment:    6 PK ON WEEKENDS    Review of Systems ROS: Statement: All systems negative except as marked or noted in the HPI; Constitutional: Negative for fever and chills. ; ; Eyes: Negative for eye pain, redness and discharge. ; ; ENMT: Negative for ear pain, hoarseness, nasal congestion, sinus pressure and sore throat. ; ; Cardiovascular: Negative for chest pain, palpitations, diaphoresis, dyspnea and peripheral edema. ; ; Respiratory: Negative for cough, wheezing and stridor. ; ; Gastrointestinal: Negative for nausea, vomiting, diarrhea, abdominal pain, blood in stool, hematemesis,  jaundice and rectal bleeding. . ; ; Genitourinary: Negative for dysuria, flank pain and hematuria. ; ; Musculoskeletal: +right shoulder pain, left ankle pain. Negative for back pain and neck pain. Negative for swelling and deformity.; ; Skin: Negative for pruritus, rash, abrasions, blisters, bruising and skin lesion.; ; Neuro: Negative for headache, lightheadedness and neck stiffness. Negative for weakness, altered level of consciousness , altered mental status, extremity weakness, paresthesias, involuntary movement, seizure and syncope.        Allergies  Review of patient's allergies indicates no known allergies.  Home Medications   Prior to Admission medications   Medication Sig Start Date End Date Taking? Authorizing Provider  gabapentin (NEURONTIN) 100 MG capsule Take 1 capsule (100 mg total) by mouth 3 (three) times daily. 01/31/14   Lorayne Marek, MD   BP 138/91 mmHg  Pulse 95  Temp(Src) 97.6 F (36.4 C) (Oral)  Resp 18  Ht 5\' 8"  (1.727 m)  Wt 150 lb (68.04 kg)  BMI 22.81 kg/m2  SpO2 100% Physical Exam  0850: Physical examination:  Nursing notes reviewed; Vital signs and O2 SAT reviewed;  Constitutional: Well developed, Well nourished, Well hydrated, In no acute distress; Head:  Normocephalic, atraumatic; Eyes: EOMI, PERRL, No scleral icterus; ENMT: Mouth and pharynx normal, Mucous membranes moist; Neck: Supple, Full range of motion, No lymphadenopathy; Cardiovascular: Regular rate and rhythm, No gallop;  Respiratory: Breath sounds clear & equal bilaterally, No wheezes.  Speaking full sentences with ease, Normal respiratory effort/excursion; Chest: Nontender, Movement normal; Abdomen: Soft, Nontender, Nondistended, Normal bowel sounds; Genitourinary: No CVA tenderness; Extremities: Pulses normal, No deformity. Pelvis stable. Right shoulder w/FROM.  NT to palp entire joint, AC joint, clavicle NT, scapula NT, proximal humerus NT, biceps tendon NT over bicipital groove.  Motor strength at  shoulder normal.  Sensation intact over deltoid region, distal NMS intact with right hand having intact and equal sensation and strength in the distribution of the median, radial, and ulnar nerve function compared to opposite side.  Strong radial pulse.  +FROM right elbow with intact motor strength biceps and triceps muscles to resistance. NT right elbow/wrist/hand. No right shoulder deformity, ecchymosis, edema, or abrasions. NT left hip/knee/foot. +tender to palp left lateral maleolar area w/localized edema, NMS intact left foot, strong pedal pp, LE muscle compartments soft.  No left proximal fibular head tenderness.  No deformity, no ecchymosis, no open wounds.  +plantarflexion of left foot w/calf squeeze.  No palpable gap left Achilles's tendon.  Decreased ROM F/E d/t pain. No calf edema or asymmetry.; Neuro: AA&Ox3, Major CN grossly intact.  Speech clear. No gross focal motor or sensory deficits in extremities.; Skin: Color normal, Warm, Dry.   ED Course  Procedures     EKG Interpretation None      MDM  MDM Reviewed: previous chart, nursing note and vitals Interpretation: x-ray      Dg Shoulder Right 08/01/2014   CLINICAL DATA:  RIGHT shoulder pain, slipped and fell on ice at his aunt's house yesterday  EXAM: RIGHT SHOULDER - 2+ VIEW  COMPARISON:  None  FINDINGS: AC joint degenerative changes.  Osseous mineralization grossly normal.  Subtle deformity at the lateral margin of the mid scapular, appears corticated and old.  No definite acute fracture, dislocation or bone destruction.  Visualized RIGHT ribs intact.  IMPRESSION: No definite acute osseous abnormalities.  Degenerative changes RIGHT AC joint.   Electronically Signed   By: Lavonia Dana M.D.   On: 08/01/2014 09:34   Dg Ankle Complete Left 08/01/2014   CLINICAL DATA:  Slipped on ice and fell yesterday at his aunt's house, LEFT ankle and LEFT knee pain, RIGHT shoulder pain  EXAM: LEFT ANKLE COMPLETE - 3+ VIEW  COMPARISON:  None   FINDINGS: Lateral soft tissue swelling.  Tiny nondisplaced fracture at tip of medial malleolus.  Oblique nondisplaced fracture distal LEFT fibular diaphysis.  Ankle mortise intact.  No additional fracture or dislocation.  IMPRESSION: Nondisplaced fracture at tip of medial malleolus.  Nondisplaced oblique fracture distal LEFT fibula.   Electronically Signed   By: Lavonia Dana M.D.   On: 08/01/2014 09:21   Dg Knee Complete 4 Views Left 08/01/2014   CLINICAL DATA:  Anterior left knee pain status post slipping on ice at a relative's home yesterday.  EXAM: LEFT KNEE - COMPLETE 4+ VIEW  COMPARISON:  MRI of the left knee of September 22, 2008  FINDINGS: The bones of the knee are adequately mineralized. There is beaking of the tibial spines. There are small spurs from the periphery of the medial and lateral tibial plateaus and femoral condyles. There are spurs from the superior and inferior articular margins of the patella. There is no joint effusion. There is no acute fracture nor dislocation.  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 There is no acute bony abnormality.   Electronically Signed  By: David  Martinique   On: 08/01/2014 09:25    0945:  Splint/crutches, f/u Ortho MD. Dx and testing d/w pt.  Questions answered.  Verb understanding, agreeable to d/c home with outpt f/u.   Francine Graven, DO 08/05/14 [phone]

## 2014-08-01 NOTE — Discharge Instructions (Signed)
°Emergency Department Resource Guide °1) Find a Doctor and Pay Out of Pocket °Although you won't have to find out who is covered by your insurance plan, it is a good idea to ask around and get recommendations. You will then need to call the office and see if the doctor you have chosen will accept you as a new patient and what types of options they offer for patients who are self-pay. Some doctors offer discounts or will set up payment plans for their patients who do not have insurance, but you will need to ask so you aren't surprised when you get to your appointment. ° °2) Contact Your Local Health Department °Not all health departments have doctors that can see patients for sick visits, but many do, so it is worth a call to see if yours does. If you don't know where your local health department is, you can check in your phone book. The CDC also has a tool to help you locate your state's health department, and many state websites also have listings of all of their local health departments. ° °3) Find a Walk-in Clinic °If your illness is not likely to be very severe or complicated, you may want to try a walk in clinic. These are popping up all over the country in pharmacies, drugstores, and shopping centers. They're usually staffed by nurse practitioners or physician assistants that have been trained to treat common illnesses and complaints. They're usually fairly quick and inexpensive. However, if you have serious medical issues or chronic medical problems, these are probably not your best option. ° °No Primary Care Doctor: °- Call [facility] at  832-8000 - they can help you locate a primary care doctor that  accepts your insurance, provides certain services, etc. °- Physician Referral Service- 1-800-533-3463 ° °Chronic Pain Problems: °Organization         Address  Phone   Notes  °[facility]  (336) 297-2271 Patients need to be referred by their primary care doctor.  ° °Medication  Assistance: °Organization         Address  Phone   Notes  °[place] [facility] [address] °[place], [place] [address] (336) 641-8030 --Must be a resident of [place] °-- Must have NO insurance coverage whatsoever (no Medicaid/ Medicare, etc.) °-- The pt. MUST have a primary care doctor that directs their care regularly and follows them in the community °  °[facility]  (866) 331-1348   °[employer]  (888) 892-1162   ° °Agencies that provide inexpensive medical care: °Organization         Address  Phone   Notes  °[facility]  (336) 832-8035   °[facility]    (336) 832-7272   °[facility] [address] °[place],  [address] (336) 832-4777   °[facility] [address], °[place] (336) 271-4999   °[facility]    (336) 373-0678   °[facility]    (336) 272-1050   °[facility] ° [address], [place] Phone:  (336) 832-4444, Fax:  (336) 832-4440 Hours of Operation:  9 am - 6 pm, M-F.  Also accepts Medicaid/Medicare and self-pay.  °[facility] ° [address], [place] Phone: (336) 832-3150, Fax: (336) 832-3151. Hours of Operation:  8:30 am - 5:30 pm, M-F.  Also accepts Medicaid and self-pay.  °[facility] [place] [address], [place] Phone: (336) 878-6027   °[facility] [address], [place], [place] (336)723-1848, Ext. 123 Mondays & Thursdays: 7-9 AM.  First 15 patients are seen on a first come, first serve basis. °  ° °Medicaid-accepting [place] Providers: ° °Organization         Address  Phone   Notes  °[facility] [address], [place] (336) 641-2100 Also accepts self-pay patients.  °[facility] [address], [place] ° (336) 856-9996   °[facility] [address], [place]  (336) 288-8857   °[facility] [address] [place] [address], [place] (336) 299-7000   °Veita Bland [address], [place]  ° (336) 373-1557 Only accepts [place] Access Medicaid patients after they have their name applied to their card.  ° °Self-Pay (no insurance) in [place]: ° °Organization         Address  Phone   Notes  °Sickle Cell Patients, [facility] [address], [place] (336) 832-1970   °[facility] [address], [place] (336) 832-4400   ° [facility] ° [address], [place] (336) 992-4800   °[facility]/Dr. Osei-Bonsu ° [address] [place] [address], [place] or [address], [place] (336) 841-8500 Phone number for both [place] and [place] locations is the same.  °[facility] [address], [place] (336) 299-0000   °[facility] [address] [place] [address], [place] or [address] (336) 852-7530 °(336) 878-2260   °[facility] [address], [place] (336) 350-1642, phone; (336) 294-5005, fax Sees patients 1st and 3rd Saturday of every month.  Must not qualify for public or private insurance (i.e. Medicaid, Medicare, [place] Health Choice, Veterans' Benefits) • Household income should be no more than 200% of the poverty level •The clinic cannot treat you if you are pregnant or think you are pregnant • Sexually transmitted diseases are not treated at the clinic.  ° ° °Dental Care: °Organization         Address  Phone  Notes  °[place] [facility] [address], [place] (336) 641-6152 Accepts children up to age 21 who are enrolled in Medicaid or [place] Health Choice; pregnant women with a Medicaid card; and children who have applied for Medicaid or [place] Health Choice, but were declined, whose parents can pay a reduced fee at time of service.  °[place]  [facility] [place]  [address], [place] (336) 641-7733 Accepts children up to age 21 who are enrolled in Medicaid or [place] Health Choice; pregnant women with a Medicaid card; and children who have applied for Medicaid or [place] Health Choice, but were declined, whose parents can pay a reduced fee at time of service.  °[facility] ° [address],  (336) 641-4533 Patients are seen by appointment only. Walk-ins are not accepted. [facility] will see patients 18 years of age and older. °Monday - Tuesday (8am-5pm) °Most Wednesdays (8:30-5pm) °$30 per visit, cash only  °[facility] ° [address], [place] (336) 641-4533 Patients are seen by appointment only. Walk-ins are not accepted. [facility] will see patients 18 years of age and older. °One   Wednesday Evening (Monthly: Volunteer Based).  $30 per visit, cash only  °[facility]  (919) 537-3737 for adults; Children under age 4, call Graduate Pediatric Dentistry at (919) [phone]. Children aged 4-14, please call (919) [phone] to request a pediatric application. ° Dental services are provided in all areas of dental care including fillings, crowns and bridges, complete and partial dentures, implants, gum treatment, root canals, and extractions. Preventive care is also provided. Treatment is provided to both adults and children. °Patients are selected via a lottery and there is often a waiting list. °  °[facility] [address], °[place] ° (336) 763-8833 [email or web address] °  °[facility] [address], [place], [place] (336)723-1848, Ext. 123 Second and Fourth Thursday of each month, opens at 6:30 AM; Clinic ends at 9 AM.  Patients are seen on a first-come first-served basis, and a limited number are seen during each clinic.  ° °[facility] ° [address], [place], [place] (336) 723-7904    Eligibility Requirements °You must have lived in [place], or [place] counties for at least the last three months. °  You cannot be eligible for state or federal sponsored healthcare insurance, including Veterans Administration, Medicaid, or Medicare. °  You generally cannot be eligible for healthcare insurance through your employer.  °  How to apply: °Eligibility screenings are held every Tuesday and Wednesday afternoon from 1:00 pm until 4:00 pm. You do not need an appointment for the interview!  °[facility] [address], [place], [place] 336-631-2330   °[facility]  336-342-8273   °[facility]  336-703-3100   °[facility]  336-570-6415   ° °Behavioral Health Resources in the Community: °Intensive Outpatient Programs °Organization         Address  Phone  Notes  °[facility] [address], [place], [place] 336-878-6098   °[facility] [address], [place], [place] 336-832-9800   °[facility] [address], [place], [place] ° 336-882-2125   °[facility] [address],  °[place], [place] 1-800-853-5163 or 336-641-4981   °Substance Abuse Resources °Organization         Address  Phone  Notes  °[facility]  336-882-2125   °[facility]  336-784-9470   °[facility]  336-285-9073   °[facility]  336-845-3988   °Residential & Outpatient Substance Abuse Program  1-800-659-3381   °Psychological Services °Organization         Address  Phone  Notes  °[facility]  336- 832-9600   °[facility]  336- 378-7881   °[facility] [address], [place] 1-800-853-5163 or 336-641-4981   ° °Mobile Crisis Teams °Organization         Address  Phone  Notes  °[facility], Mobile Crisis Care Unit  1-877-626-1772   °Assertive °[facility] ° [address]  [place], [place] 336-834-9664   °Sharon DeEsch [address] °[place]  336-554-5454   ° °Self-Help/Support Groups °Organization         Address  Phone             Notes  °[facility] - variety of support groups  336- 373-1402 Call for more information  °Narcotics Anonymous (NA), [facility] [address], °[place]   2 meetings at this location  ° °  Residential Treatment Programs Organization         Address  Phone  Notes  [facility] [address]    [place]  1-404 554 6901   [facility]  [address] [place] [address], [place], [place]   [place], [place] 331-745-6500 Admissions: 8am-3pm M-F  [facility] [place] [address],    [place], [place] [phone]   [facility] [address] [place], [place], [place]   [facility] [address]  [place], [place]   [facility] Intensive Outpatient [address], [place], [place]   [facility] ([place]  [place], [place] 1-531-223-4194 or [phone]   [facility] [address] [place], [place] Accepts Medicaid  [facility] [address],  [place] 1-(513) 122-6325 Substance Abuse/Addiction Treatment   [facility]         Address  Phone  Notes  [facility]  (504) 748-0365   Domenic Schwab, PhD [address] [place], [place]   [phone] or [phone]   Monon Leroy [place], [place] [phone]   [facility] [address], [place], [place] 254-269-4915 Insurance/Medicaid/sponsorship through [facility] [address]                                    [place], [place] [phone] [place] [address], [place] [phone]    Dr. Adele Schilder  ([phone]   [facility] Dept. 1) [address] [place] 2) [place] 3)  [place] [address], [place] [phone]) [phone]  ([phone]) [phone]   [place] 682-237-1066 or [phone] (After Hours)      Take the prescription as directed.  Elevate as much as possible. Apply ice to the area(s) of discomfort, for 15 minutes at a time, several times per day for the next few days.  Do not fall asleep on an ice pack.  Call your Orthopedic doctor today to schedule a follow up appointment this week.  Return to the Emergency Department immediately if worsening.

## 2014-08-01 NOTE — ED Notes (Signed)
Pt states he fell on the ice last night. Complain of pain in the left ankle and right shoulder.

## 2014-08-09 ENCOUNTER — Encounter: Payer: Medicaid Other | Attending: Physical Medicine & Rehabilitation | Admitting: Physical Medicine & Rehabilitation

## 2014-08-09 ENCOUNTER — Other Ambulatory Visit: Payer: Self-pay | Admitting: Physical Medicine & Rehabilitation

## 2014-08-09 ENCOUNTER — Encounter: Payer: Self-pay | Admitting: Physical Medicine & Rehabilitation

## 2014-08-09 VITALS — BP 131/56 | HR 88 | Resp 14

## 2014-08-09 DIAGNOSIS — G959 Disease of spinal cord, unspecified: Secondary | ICD-10-CM | POA: Diagnosis not present

## 2014-08-09 DIAGNOSIS — S82842S Displaced bimalleolar fracture of left lower leg, sequela: Secondary | ICD-10-CM

## 2014-08-09 DIAGNOSIS — M545 Low back pain: Secondary | ICD-10-CM | POA: Diagnosis not present

## 2014-08-09 DIAGNOSIS — S24109S Unspecified injury at unspecified level of thoracic spinal cord, sequela: Secondary | ICD-10-CM

## 2014-08-09 DIAGNOSIS — M47816 Spondylosis without myelopathy or radiculopathy, lumbar region: Secondary | ICD-10-CM | POA: Insufficient documentation

## 2014-08-09 DIAGNOSIS — M4804 Spinal stenosis, thoracic region: Secondary | ICD-10-CM | POA: Diagnosis not present

## 2014-08-09 DIAGNOSIS — M5124 Other intervertebral disc displacement, thoracic region: Secondary | ICD-10-CM | POA: Diagnosis not present

## 2014-08-09 DIAGNOSIS — S82842A Displaced bimalleolar fracture of left lower leg, initial encounter for closed fracture: Secondary | ICD-10-CM | POA: Insufficient documentation

## 2014-08-09 DIAGNOSIS — G8929 Other chronic pain: Secondary | ICD-10-CM | POA: Insufficient documentation

## 2014-08-09 DIAGNOSIS — G9520 Unspecified cord compression: Secondary | ICD-10-CM

## 2014-08-09 DIAGNOSIS — Z5181 Encounter for therapeutic drug level monitoring: Secondary | ICD-10-CM

## 2014-08-09 DIAGNOSIS — Z79899 Other long term (current) drug therapy: Secondary | ICD-10-CM

## 2014-08-09 MED ORDER — BACLOFEN 10 MG PO TABS: 10.0000 mg | ORAL_TABLET | Freq: Three times a day (TID) | ORAL | Status: DC | PRN

## 2014-08-09 MED ORDER — NAPROXEN 500 MG PO TABS: 500.0000 mg | ORAL_TABLET | Freq: Two times a day (BID) | ORAL | Status: DC

## 2014-08-09 NOTE — Addendum Note (Signed)
Addended by: Geryl Rankins D on: 08/09/2014 01:41 PM   Modules accepted: Orders

## 2014-08-09 NOTE — Patient Instructions (Signed)
WE WILL TEST YOUR URINE TODAY. IF THERE IS ANY EVIDENCE OF DRUG USE IN YOUR URINE, WE WILL NOT BE ABLE TO PRESCRIBE YOU FURTHER MEDICATION----REGARDLESS OF THE TYPE OF MEDICATION.  THE RESULTS OF YOUR URINE TESTING COULD TAKE A WEEK OR MORE TO RETURN, HOWEVER.  IF WE DO NOT CONTACT YOU REGARDING THESE RESULTS WITHIN 10 DAYS, PLEASE CONTACT us.

## 2014-08-09 NOTE — Progress Notes (Signed)
Subjective:    Patient ID: Eddie Rocha, male    DOB: 24-Mar-1966, 49 y.o.   MRN: [id number]  HPI   Mr. Janis is here in follow up of his low back pain. I have seen him twice. He left last time after refusing to take tramadol while actively using marijuana.   He continues to have chronic low to mid back pain which is worse with walking or sitting. He has an MRI from 2014 which revealed:   L4-5: Degenerative disc disease with a bulging annulus and osteophytic ridging. There is also a central disc protrusion and the combination creates moderately severe spinal and bilateral lateral recess stenosis and mild bilateral foraminal stenosis.  L5-S1: Mild bulging annulus and moderate facet disease contributing to mild spinal and mild to moderate bilateral lateral recess stenosis. No significant foraminal stenosis.  S1-S2: Mild facet disease but no disc protrusions, spinal or foraminal stenosis.  He fell off a porch on 1/26 when his right leg gave out. He suffered a mild bimalleolar left ankle fracture.     Pain Inventory Average Pain 9 Pain Right Now 7 My pain is sharp and burning  In the last 24 hours, has pain interfered with the following? General activity 7 Relation with others 1 Enjoyment of life 9 What TIME of day is your pain at its worst? all Sleep (in general) Poor  Pain is worse with: walking, bending, inactivity and standing Pain improves with: pt does not have any relief Relief from Meds: pt does not have pain meds, 0 relief  Mobility walk with assistance use a walker how many minutes can you walk? 5 ability to climb steps?  yes do you drive?  no needs help with transfers  Function not employed: date last employed 06/29/2014 disabled: date disabled 07/03/2014 I need assistance with the following:  dressing, bathing, meal prep and shopping  Neuro/Psych trouble walking spasms depression  Prior Studies Any changes since last visit?   yes x-rays  Physicians involved in your care no selection   Family History  Problem Relation Age of Onset  . Cancer Brother     throat   History   Social History  . Marital Status: Legally Separated    Spouse Name: N/A    Number of Children: N/A  . Years of Education: N/A   Social History Main Topics  . Smoking status: Current Every Day Smoker -- 1.00 packs/day for 28 years    Types: Cigarettes  . Smokeless tobacco: Never Used  . Alcohol Use: Yes     Comment:    6 PK ON WEEKENDS  . Drug Use: No  . Sexual Activity: No   Other Topics Concern  . None   Social History Narrative   Past Surgical History  Procedure Laterality Date  . Mandible fracture surgery    . Hernia repair    . Laminectomy  07/06/2013    T9    T10    CORD COMPRESSION  . Lumbar laminectomy/decompression microdiscectomy N/A 07/06/2013    Procedure: LUMBAR LAMINECTOMY, DECOMPRESSION  T9 to T10;  Surgeon: Consuella Lose, MD;  Location: [facility];  Service: Neurosurgery;  Laterality: N/A;  . Back surgery     Past Medical History  Diagnosis Date  . Hernia   . Chronic back pain    BP 131/56 mmHg  Pulse 88  Resp 14  SpO2 98%  Opioid Risk Score:   Fall Risk Score: Moderate Fall Risk (6-13 points)  Review of Systems  Constitutional:       Weight loss  Musculoskeletal: Positive for gait problem.  Neurological:       Spasms   Psychiatric/Behavioral: Positive for dysphoric mood.  All other systems reviewed and are negative.      Objective:   Physical Exam  Constitutional: He is oriented to person, place, and time. He appears well-developed and well-nourished.  HENT: oral mucosa irritated along lower teeth, no frank ulceration  Head: Normocephalic and atraumatic.  Eyes: Conjunctivae are normal. Pupils are equal, round, and reactive to light.  Neck: Normal range of motion. Neck supple.  Cardiovascular: Normal rate and regular rhythm.  No murmur heard.  Respiratory: Effort normal and  breath sounds normal. No respiratory distress. He has no wheezes.  GI: Soft. Bowel sounds are normal. He exhibits no distension. There is no tenderness.  Musculoskeletal: diffuse paraspinal tenderness mid thoracic spine to the lumbar spine. Pain worse with palpation. Left ankle in splint/ace---both malleoli are tender to touch. He is using crutches and maintaining NWB lle.   Neurological: He is alert and oriented to person, place, and time.  Decreased PP and LT below the umbilicus (1+/2). LE grossly 4+/5 proximal to distal with left leg stronger than right. DTR's 3+. Gait generally stable. Did lose balance once when changing directions. UE's normal.  Skin: Skin is warm and dry.  thoracic incision healed--area sensitive to touch.  Psychiatric: he remains irritable.   Assessment/Plan:  1. 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 Pain Management: consider resuming gabapentin - baclofen 10mg  q8 prn for cramping -naproxen 500mg  bid with food  -UDS performed today----no further meds without clean test. If test is + again, I will not write him any further rx's despite the type of medicine 3. Left bimalleolar ankle fracture----follow up with ortho -can be managed conservatively with walking boot or at most a cast most likely -continue NWB LLE and splint for now. 4. Follow up in 2 months. Thirty minutes of face to face patient care time were spent during this visit. All questions were encouraged and answered.

## 2014-08-10 LAB — PMP ALCOHOL METABOLITE (ETG)

## 2014-08-13 LAB — ETHYL GLUCURONIDE, URINE
Ethyl Glucuronide (EtG): 69866 ng/mL — ABNORMAL HIGH (ref <500)
Ethyl Sulfate (ETS): >40000 ng/mL — ABNORMAL HIGH (ref <100)

## 2014-08-13 LAB — CANNABANOIDS (GC/LC/MS), URINE: THC-COOH (GC/LC/MS), ur confirm: 676 ng/mL — AB (ref <5)

## 2014-08-15 LAB — PRESCRIPTION MONITORING PROFILE (SOLSTAS)
AMPHETAMINE/METH: NEGATIVE ng/mL
BARBITURATE SCREEN, URINE: NEGATIVE ng/mL
Benzodiazepine Screen, Urine: NEGATIVE ng/mL
Buprenorphine, Urine: NEGATIVE ng/mL
Carisoprodol, Urine: NEGATIVE ng/mL
Cocaine Metabolites: NEGATIVE ng/mL
Creatinine, Urine: 150.3 mg/dL (ref >20.0)
Fentanyl, Ur: NEGATIVE ng/mL
MDMA URINE: NEGATIVE ng/mL
Meperidine, Ur: NEGATIVE ng/mL
Methadone Screen, Urine: NEGATIVE ng/mL
NITRITES URINE, INITIAL: NEGATIVE ug/mL
OPIATE SCREEN, URINE: NEGATIVE ng/mL
Oxycodone Screen, Ur: NEGATIVE ng/mL
PROPOXYPHENE: NEGATIVE ng/mL
TRAMADOL UR: NEGATIVE ng/mL
Tapentadol, urine: NEGATIVE ng/mL
Zolpidem, Urine: NEGATIVE ng/mL
pH, Initial: 5.5 pH (ref 4.5–8.9)

## 2014-08-16 ENCOUNTER — Telehealth: Payer: Self-pay | Admitting: Physical Medicine & Rehabilitation

## 2014-08-16 NOTE — Telephone Encounter (Signed)
UDS positive---dc from clinic---i will not see. Offer drug rehab options

## 2014-08-16 NOTE — Telephone Encounter (Signed)
Letter mailed notifying of discharge from clinic.  Included is a pamphlet for the [facility].

## 2014-08-29 NOTE — Progress Notes (Signed)
Urine drug screen for this encounter is positive for marijuana and alcohol.

## 2014-10-11 ENCOUNTER — Ambulatory Visit: Payer: Medicaid Other | Admitting: Physical Medicine & Rehabilitation

## 2014-11-18 ENCOUNTER — Encounter (HOSPITAL_COMMUNITY): Payer: Self-pay

## 2014-11-18 ENCOUNTER — Emergency Department (HOSPITAL_COMMUNITY): Payer: Medicaid Other

## 2014-11-18 ENCOUNTER — Emergency Department (HOSPITAL_COMMUNITY)
Admission: EM | Admit: 2014-11-18 | Discharge: 2014-11-18 | Disposition: A | Discharge status: Home / Self Care | Payer: Medicaid Other | Attending: Emergency Medicine | Admitting: Emergency Medicine

## 2014-11-18 DIAGNOSIS — M545 Low back pain: Secondary | ICD-10-CM | POA: Insufficient documentation

## 2014-11-18 DIAGNOSIS — Z720 Tobacco use: Secondary | ICD-10-CM | POA: Insufficient documentation

## 2014-11-18 DIAGNOSIS — R079 Chest pain, unspecified: Secondary | ICD-10-CM

## 2014-11-18 DIAGNOSIS — G8929 Other chronic pain: Secondary | ICD-10-CM | POA: Insufficient documentation

## 2014-11-18 DIAGNOSIS — M25511 Pain in right shoulder: Secondary | ICD-10-CM | POA: Insufficient documentation

## 2014-11-18 DIAGNOSIS — Z79899 Other long term (current) drug therapy: Secondary | ICD-10-CM | POA: Insufficient documentation

## 2014-11-18 DIAGNOSIS — Z9889 Other specified postprocedural states: Secondary | ICD-10-CM | POA: Insufficient documentation

## 2014-11-18 DIAGNOSIS — L818 Other specified disorders of pigmentation: Secondary | ICD-10-CM | POA: Diagnosis not present

## 2014-11-18 DIAGNOSIS — Z8719 Personal history of other diseases of the digestive system: Secondary | ICD-10-CM | POA: Insufficient documentation

## 2014-11-18 DIAGNOSIS — M549 Dorsalgia, unspecified: Secondary | ICD-10-CM

## 2014-11-18 LAB — CBC WITH DIFFERENTIAL/PLATELET
BASOS ABS: 0 10*3/uL (ref 0.0–0.1)
Basophils Relative: 1 % (ref 0–1)
Eosinophils Absolute: 0.4 10*3/uL (ref 0.0–0.7)
Eosinophils Relative: 5 % (ref 0–5)
HCT: 40 % (ref 39.0–52.0)
Hemoglobin: 13.1 g/dL (ref 13.0–17.0)
Lymphocytes Relative: 37 % (ref 12–46)
Lymphs Abs: 2.6 10*3/uL (ref 0.7–4.0)
MCH: 32.2 pg (ref 26.0–34.0)
MCHC: 32.8 g/dL (ref 30.0–36.0)
MCV: 98.3 fL (ref 78.0–100.0)
Monocytes Absolute: 0.4 10*3/uL (ref 0.1–1.0)
Monocytes Relative: 6 % (ref 3–12)
Neutro Abs: 3.6 10*3/uL (ref 1.7–7.7)
Neutrophils Relative %: 51 % (ref 43–77)
Platelets: 270 10*3/uL (ref 150–400)
RBC: 4.07 MIL/uL — ABNORMAL LOW (ref 4.22–5.81)
RDW: 14.8 % (ref 11.5–15.5)
WBC: 6.9 10*3/uL (ref 4.0–10.5)

## 2014-11-18 LAB — BASIC METABOLIC PANEL
ANION GAP: 9 (ref 5–15)
BUN: 18 mg/dL (ref 6–20)
CO2: 25 mmol/L (ref 22–32)
Calcium: 8.9 mg/dL (ref 8.9–10.3)
Chloride: 109 mmol/L (ref 101–111)
Creatinine, Ser: 0.91 mg/dL (ref 0.61–1.24)
GFR calc Af Amer: >60 mL/min (ref >60)
GFR calc non Af Amer: >60 mL/min (ref >60)
Glucose, Bld: 84 mg/dL (ref 65–99)
Potassium: 3.9 mmol/L (ref 3.5–5.1)
Sodium: 143 mmol/L (ref 135–145)

## 2014-11-18 LAB — TROPONIN I

## 2014-11-18 MED ORDER — ASPIRIN 325 MG PO TABS
325.0000 mg | ORAL_TABLET | Freq: Once | ORAL | Status: AC
  Administered 2014-11-18: 325 mg via ORAL
  Filled 2014-11-18: qty 1

## 2014-11-18 MED ORDER — KETOROLAC TROMETHAMINE 60 MG/2ML IM SOLN
60.0000 mg | Freq: Once | INTRAMUSCULAR | Status: AC
  Administered 2014-11-18: 60 mg via INTRAMUSCULAR
  Filled 2014-11-18: qty 2

## 2014-11-18 MED ORDER — HYDROCODONE-ACETAMINOPHEN 5-325 MG PO TABS
1.0000 | ORAL_TABLET | Freq: Once | ORAL | Status: AC
  Administered 2014-11-18: 1 via ORAL
  Filled 2014-11-18: qty 1

## 2014-11-18 MED ORDER — NAPROXEN 500 MG PO TABS: 500.0000 mg | ORAL_TABLET | Freq: Two times a day (BID) | ORAL | Status: DC

## 2014-11-18 NOTE — ED Notes (Signed)
Patient with no complaints at this time. Respirations even and unlabored. Skin warm/dry. Discharge instructions reviewed with patient at this time. Patient given opportunity to voice concerns/ask questions. Patient discharged at this time and left Emergency Department with steady gait.   

## 2014-11-18 NOTE — ED Provider Notes (Signed)
CSN: [id number]     Arrival date & time 11/18/14  0239 History   First MD Initiated Contact with Patient 11/18/14 [phone]     Chief Complaint  Patient presents with  . Back Pain  . Shoulder Pain     (Consider location/radiation/quality/duration/timing/severity/associated sxs/prior Treatment) HPI  This is a 49 year old male who presents with chest pain and back pain. Patient also reports right shoulder pain. Denies any recent injury but states that he fell 3 months ago. She reports constant lower back pain which is chronic in nature and has been ongoing for the last year. Also reports 3 days of anterior chest pain. Nonradiating. No associated shortness of breath or diaphoresis. Currently rates pain at 10 out of 10. States that he is taking naproxen at home without relief of his symptoms. Denies any history of blood clots or lower extremity swelling. Patient denies any weakness, numbness, tingling of lower extremities, bowel or bladder dysfunction. Past Medical History  Diagnosis Date  . Hernia   . Chronic back pain    Past Surgical History  Procedure Laterality Date  . Mandible fracture surgery    . Hernia repair    . Laminectomy  07/06/2013    T9    T10    CORD COMPRESSION  . Lumbar laminectomy/decompression microdiscectomy N/A 07/06/2013    Procedure: LUMBAR LAMINECTOMY, DECOMPRESSION  T9 to T10;  Surgeon: Consuella Lose, MD;  Location: [facility];  Service: Neurosurgery;  Laterality: N/A;  . Back surgery     Family History  Problem Relation Age of Onset  . Cancer Brother     throat   History  Substance Use Topics  . Smoking status: Current Every Day Smoker -- 1.00 packs/day for 28 years    Types: Cigarettes  . Smokeless tobacco: Never Used  . Alcohol Use: Yes     Comment:    6 PK ON WEEKENDS    Review of Systems  Constitutional: Negative.  Negative for fever.  Respiratory: Positive for chest tightness. Negative for cough and shortness of breath.   Cardiovascular:  Negative.  Negative for chest pain.  Gastrointestinal: Negative.  Negative for abdominal pain.  Genitourinary: Negative for dysuria and difficulty urinating.  Musculoskeletal: Positive for back pain.       Shoulder pain  Skin: Negative for rash.  Neurological: Negative for weakness and headaches.  All other systems reviewed and are negative.     Allergies  Review of patient's allergies indicates no known allergies.  Home Medications   Prior to Admission medications   Medication Sig Start Date End Date Taking? Authorizing Provider  baclofen (LIORESAL) 10 MG tablet Take 1 tablet (10 mg total) by mouth every 8 (eight) hours as needed for muscle spasms. 08/09/14   Meredith Staggers, MD  gabapentin (NEURONTIN) 100 MG capsule Take 1 capsule (100 mg total) by mouth 3 (three) times daily. Patient not taking: Reported on 08/09/2014 01/31/14   Lorayne Marek, MD  HYDROcodone-acetaminophen (NORCO/VICODIN) 5-325 MG per tablet 1 or 2 tabs PO q6 hours prn pain Patient not taking: Reported on 08/09/2014 08/01/14   Francine Graven, DO  naproxen (NAPROSYN) 500 MG tablet Take 1 tablet (500 mg total) by mouth 2 (two) times daily with a meal. 08/09/14   Meredith Staggers, MD  naproxen (NAPROSYN) 500 MG tablet Take 1 tablet (500 mg total) by mouth 2 (two) times daily. 11/18/14   Merryl Hacker, MD   BP 112/76 mmHg  Pulse 80  Temp(Src) 97.7 F (36.5  C) (Oral)  Resp 15  Ht 5\' 8"  (1.727 m)  Wt 145 lb (65.772 kg)  BMI 22.05 kg/m2  SpO2 100% Physical Exam  Constitutional: He is oriented to person, place, and time. He appears well-developed and well-nourished. No distress.  HENT:  Head: Normocephalic and atraumatic.  Cardiovascular: Normal rate, regular rhythm and normal heart sounds.   No murmur heard. Pulmonary/Chest: Effort normal and breath sounds normal. No respiratory distress. He has no wheezes. He exhibits tenderness.  Abdominal: Soft. Bowel sounds are normal. There is no tenderness. There is no  rebound.  Musculoskeletal: He exhibits no edema.  Range of motion of the right shoulder limited secondary to pain, no obvious tenderness to palpation or deformity, no midline step-off or deformity of the lumbar spine  Neurological: He is alert and oriented to person, place, and time.  Skin: Skin is warm and dry.  Multiple tattoos  Psychiatric: He has a normal mood and affect.  Nursing note and vitals reviewed.   ED Course  Procedures (including critical care time) Labs Review Labs Reviewed  CBC WITH DIFFERENTIAL/PLATELET - Abnormal; Notable for the following:    RBC 4.07 (*)    All other components within normal limits  BASIC METABOLIC PANEL  TROPONIN I    Imaging Review Dg Chest 2 View  11/18/2014   CLINICAL DATA:  Back pain for 1 year in right shoulder pain for several months. Sudden onset chest pain 4 days ago.  EXAM: CHEST  2 VIEW  COMPARISON:  None.  FINDINGS: Mild hyperinflation. Normal heart size and pulmonary vascularity. No focal airspace disease or consolidation in the lungs. No blunting of costophrenic angles. No pneumothorax. Mediastinal contours appear intact. Degenerative changes in the spine. Postoperative changes incidentally noted in the mandibles.  IMPRESSION: No active cardiopulmonary disease.   Electronically Signed   By: Lucienne Capers M.D.   On: 11/18/2014 06:37   Dg Shoulder Right  11/18/2014   CLINICAL DATA:  Back pain for 1 year and right shoulder pain for several months.  EXAM: RIGHT SHOULDER - 2+ VIEW  COMPARISON:  08/01/2014  FINDINGS: There is no evidence of fracture or dislocation. There is no evidence of arthropathy or other focal bone abnormality. Soft tissues are unremarkable.  IMPRESSION: Negative.   Electronically Signed   By: Lucienne Capers M.D.   On: 11/18/2014 06:38     EKG Interpretation   Date/Time:  Saturday Nov 18 2014 03:18:13 EDT Ventricular Rate:  81 PR Interval:  129 QRS Duration: 84 QT Interval:  387 QTC Calculation: 449 R Axis:    80 Text Interpretation:  Sinus rhythm ST elev, probable normal early repol  pattern Similar to prior Confirmed by Marysol Wellnitz  MD, [place] ([id number]) on  11/18/2014 4:07:15 AM      MDM   Final diagnoses:  Chest pain, unspecified chest pain type  Chronic back pain    Patient presents with complaints of back pain, right shoulder pain, and chest pain. Back pain is chronic complaint and he has no signs or symptoms of cauda equina. Range of motion the right shoulder limited secondary to pain without obvious deformity. Chest pain is reproducible on exam. EKG reassuring and plain films negative.  Troponin I other basic labwork reassuring. Doubt ACS. Given patient's age and risk factors, will refer to cardiology chest pain persists he should have a stress test. Patient was informed and stated understanding. He will not be given any pain medication at discharge.  After history, exam, and medical workup I  feel the patient has been appropriately medically screened and is safe for discharge home. Pertinent diagnoses were discussed with the patient. Patient was given return precautions.     Merryl Hacker, MD 11/19/14 [phone]

## 2014-11-18 NOTE — ED Notes (Signed)
Pt arrives with c/o back pain x 1 year and right shoulder pain for several months.  Pt is uncooperative, resisting answering questions or put on gown, just stares at this nurse

## 2014-11-18 NOTE — Discharge Instructions (Signed)
You were seen today for chest, back, and shoulder pain. You have a history of chronic back pain. Your lab work and imaging is reassuring. You can take naproxen for your pain. Regarding her chest pain, given that you are 49 years old and a smoker, you likely need cardiology evaluation as an outpatient. You should call the cardiology office to set up stress testing as soon as possible. See return precautions below.  Back Pain, Adult Low back pain is very common. About 1 in 5 people have back pain.The cause of low back pain is rarely dangerous. The pain often gets better over time.About half of people with a sudden onset of back pain feel better in just 2 weeks. About 8 in 10 people feel better by 6 weeks.  CAUSES Some common causes of back pain include:  Strain of the muscles or ligaments supporting the spine.  Wear and tear (degeneration) of the spinal discs.  Arthritis.  Direct injury to the back. DIAGNOSIS Most of the time, the direct cause of low back pain is not known.However, back pain can be treated effectively even when the exact cause of the pain is unknown.Answering your caregiver's questions about your overall health and symptoms is one of the most accurate ways to make sure the cause of your pain is not dangerous. If your caregiver needs more information, he or she may order lab work or imaging tests (X-rays or MRIs).However, even if imaging tests show changes in your back, this usually does not require surgery. HOME CARE INSTRUCTIONS For many people, back pain returns.Since low back pain is rarely dangerous, it is often a condition that people can learn to [facility] their own.   Remain active. It is stressful on the back to sit or stand in one place. Do not sit, drive, or stand in one place for more than 30 minutes at a time. Take short walks on level surfaces as soon as pain allows.Try to increase the length of time you walk each day.  Do not stay in bed.Resting more than 1  or 2 days can delay your recovery.  Do not avoid exercise or work.Your body is made to move.It is not dangerous to be active, even though your back may hurt.Your back will likely heal faster if you return to being active before your pain is gone.  Pay attention to your body when you bend and lift. Many people have less discomfortwhen lifting if they bend their knees, keep the load close to their bodies,and avoid twisting. Often, the most comfortable positions are those that put less stress on your recovering back.  Find a comfortable position to sleep. Use a firm mattress and lie on your side with your knees slightly bent. If you lie on your back, put a pillow under your knees.  Only take over-the-counter or prescription medicines as directed by your caregiver. Over-the-counter medicines to reduce pain and inflammation are often the most helpful.Your caregiver may prescribe muscle relaxant drugs.These medicines help dull your pain so you can more quickly return to your normal activities and healthy exercise.  Put ice on the injured area.  Put ice in a plastic bag.  Place a towel between your skin and the bag.  Leave the ice on for 15-20 minutes, 03-04 times a day for the first 2 to 3 days. After that, ice and heat may be alternated to reduce pain and spasms.  Ask your caregiver about trying back exercises and gentle massage. This may be of some benefit.  Avoid feeling anxious or stressed.Stress increases muscle tension and can worsen back pain.It is important to recognize when you are anxious or stressed and learn ways to manage it.Exercise is a great option. SEEK MEDICAL CARE IF:  You have pain that is not relieved with rest or medicine.  You have pain that does not improve in 1 week.  You have new symptoms.  You are generally not feeling well. SEEK IMMEDIATE MEDICAL CARE IF:   You have pain that radiates from your back into your legs.  You develop new bowel or bladder  control problems.  You have unusual weakness or numbness in your arms or legs.  You develop nausea or vomiting.  You develop abdominal pain.  You feel faint. Document Released: 06/23/2005 Document Revised: 12/23/2011 Document Reviewed: 10/25/2013 [facility] Patient Information 2015 [place], [place]. This information is not intended to replace advice given to you by your health care provider. Make sure you discuss any questions you have with your health care provider.   Chest Pain (Nonspecific) It is often hard to give a specific diagnosis for the cause of chest pain. There is always a chance that your pain could be related to something serious, such as a heart attack or a blood clot in the lungs. You need to follow up with your health care provider for further evaluation. CAUSES   Heartburn.  Pneumonia or bronchitis.  Anxiety or stress.  Inflammation around your heart (pericarditis) or lung (pleuritis or pleurisy).  A blood clot in the lung.  A collapsed lung (pneumothorax). It can develop suddenly on its own (spontaneous pneumothorax) or from trauma to the chest.  Shingles infection (herpes zoster virus). The chest wall is composed of bones, muscles, and cartilage. Any of these can be the source of the pain.  The bones can be bruised by injury.  The muscles or cartilage can be strained by coughing or overwork.  The cartilage can be affected by inflammation and become sore (costochondritis). DIAGNOSIS  Lab tests or other studies may be needed to find the cause of your pain. Your health care provider may have you take a test called an ambulatory electrocardiogram (ECG). An ECG records your heartbeat patterns over a 24-hour period. You may also have other tests, such as:  Transthoracic echocardiogram (TTE). During echocardiography, sound waves are used to evaluate how blood flows through your heart.  Transesophageal echocardiogram (TEE).  Cardiac monitoring. This allows your  health care provider to monitor your heart rate and rhythm in real time.  Holter monitor. This is a portable device that records your heartbeat and can help diagnose heart arrhythmias. It allows your health care provider to track your heart activity for several days, if needed.  Stress tests by exercise or by giving medicine that makes the heart beat faster. TREATMENT   Treatment depends on what may be causing your chest pain. Treatment may include:  Acid blockers for heartburn.  Anti-inflammatory medicine.  Pain medicine for inflammatory conditions.  Antibiotics if an infection is present.  You may be advised to change lifestyle habits. This includes stopping smoking and avoiding alcohol, caffeine, and chocolate.  You may be advised to keep your head raised (elevated) when sleeping. This reduces the chance of acid going backward from your stomach into your esophagus. Most of the time, nonspecific chest pain will improve within 2-3 days with rest and mild pain medicine.  HOME CARE INSTRUCTIONS   If antibiotics were prescribed, take them as directed. Finish them even if you start  to feel better.  For the next few days, avoid physical activities that bring on chest pain. Continue physical activities as directed.  Do not use any tobacco products, including cigarettes, chewing tobacco, or electronic cigarettes.  Avoid drinking alcohol.  Only take medicine as directed by your health care provider.  Follow your health care provider's suggestions for further testing if your chest pain does not go away.  Keep any follow-up appointments you made. If you do not go to an appointment, you could develop lasting (chronic) problems with pain. If there is any problem keeping an appointment, call to reschedule. SEEK MEDICAL CARE IF:   Your chest pain does not go away, even after treatment.  You have a rash with blisters on your chest.  You have a fever. SEEK IMMEDIATE MEDICAL CARE IF:    You have increased chest pain or pain that spreads to your arm, neck, jaw, back, or abdomen.  You have shortness of breath.  You have an increasing cough, or you cough up blood.  You have severe back or abdominal pain.  You feel nauseous or vomit.  You have severe weakness.  You faint.  You have chills. This is an emergency. Do not wait to see if the pain will go away. Get medical help at once. Call your local emergency services (911 in [place] Do not drive yourself to the hospital. MAKE SURE YOU:   Understand these instructions.  Will watch your condition.  Will get help right away if you are not doing well or get worse. Document Released: 04/02/2005 Document Revised: 06/28/2013 Document Reviewed: 01/27/2008 [facility] Patient Information 2015 [place], [place]. This information is not intended to replace advice given to you by your health care provider. Make sure you discuss any questions you have with your health care provider.

## 2016-05-10 IMAGING — DX DG SHOULDER 2+V*R*
3 series · 3 of 3 positions shown · non-contrast
Comparison: 08/01/2014

CLINICAL DATA: Back pain for 1 year and right shoulder pain for
several months.

EXAM:
RIGHT SHOULDER - 2+ VIEW

[shoulder ap]
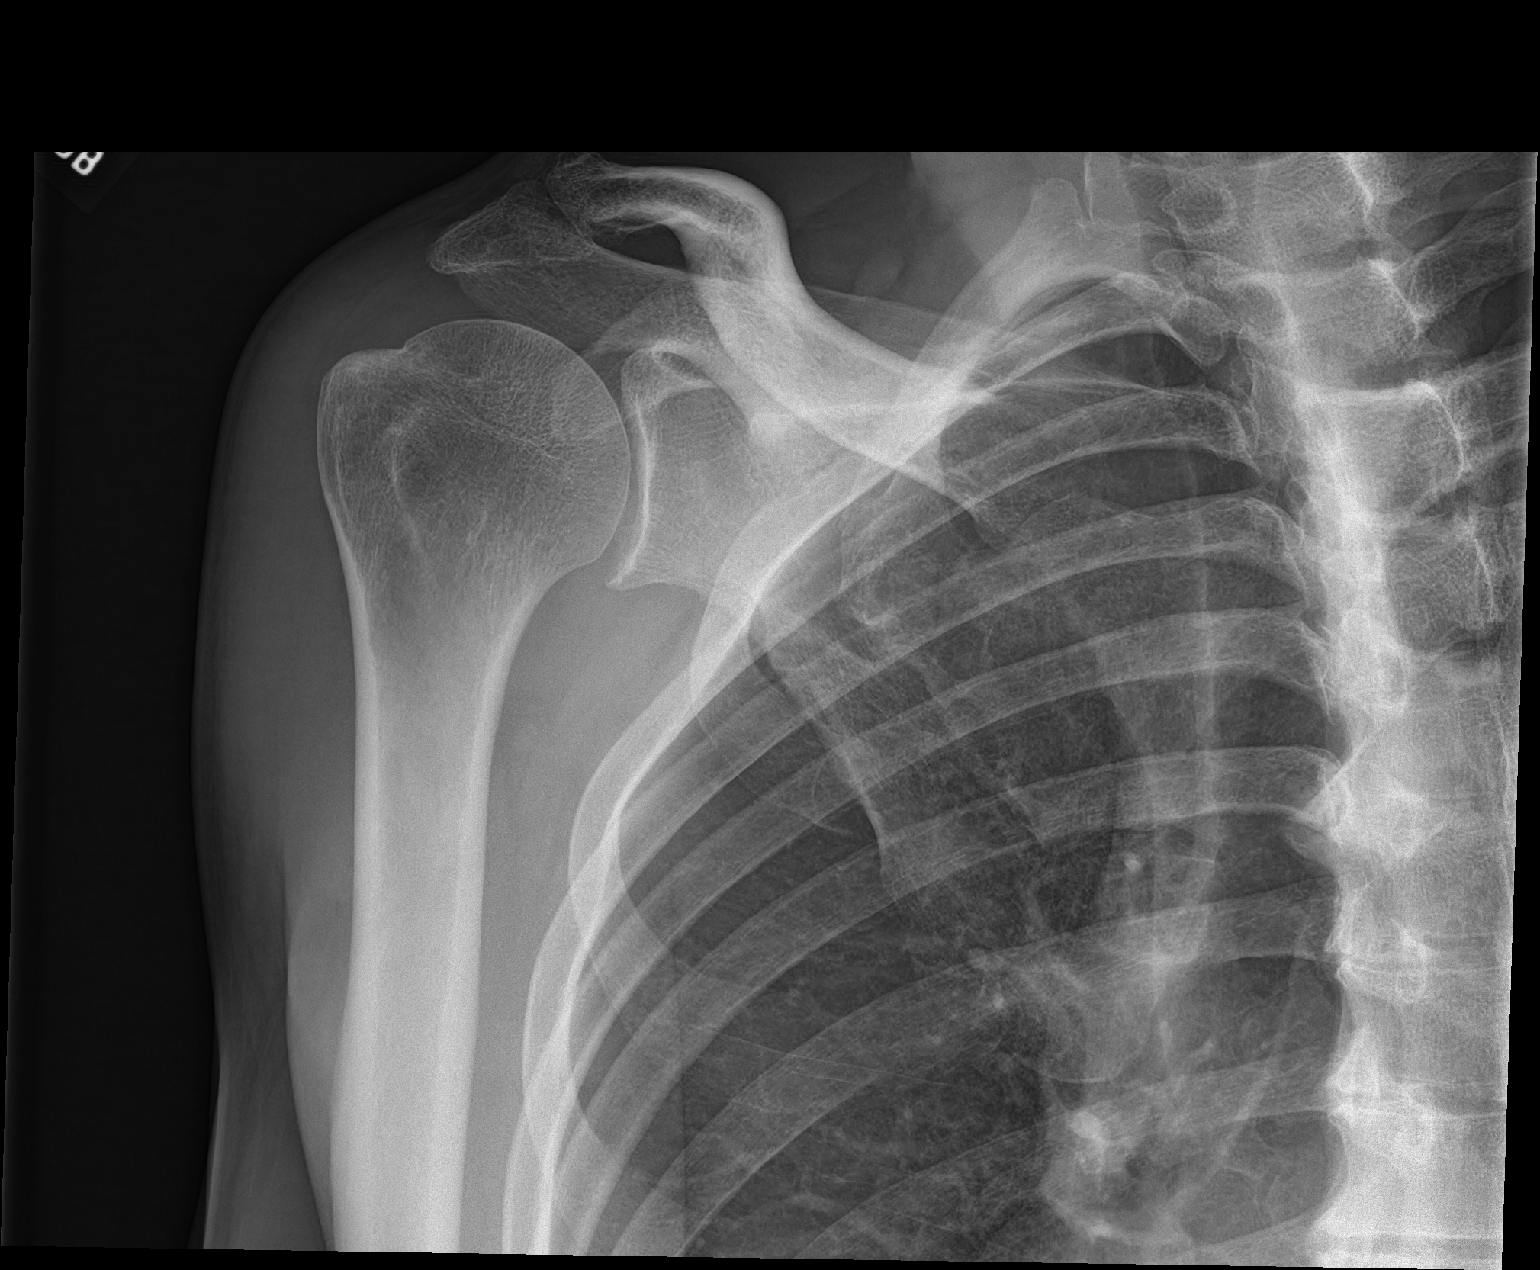

[shoulder y view]
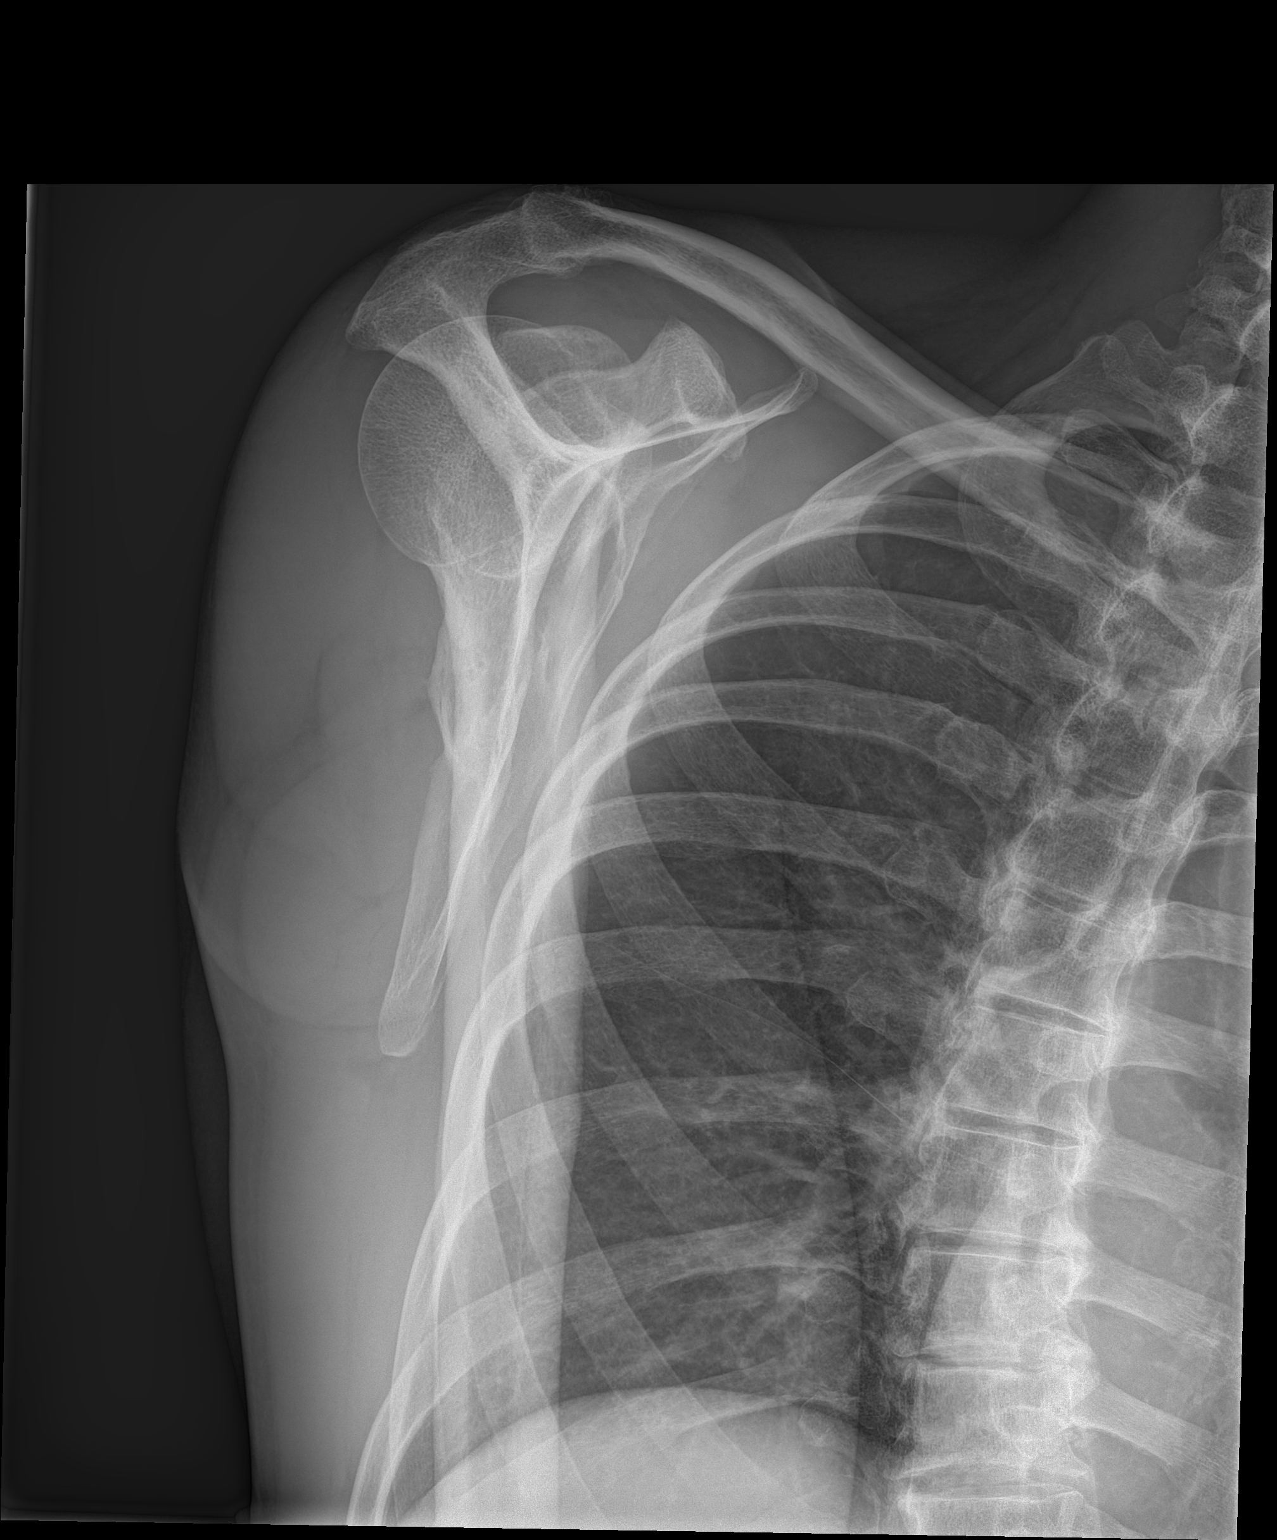

[shoulder axillary]
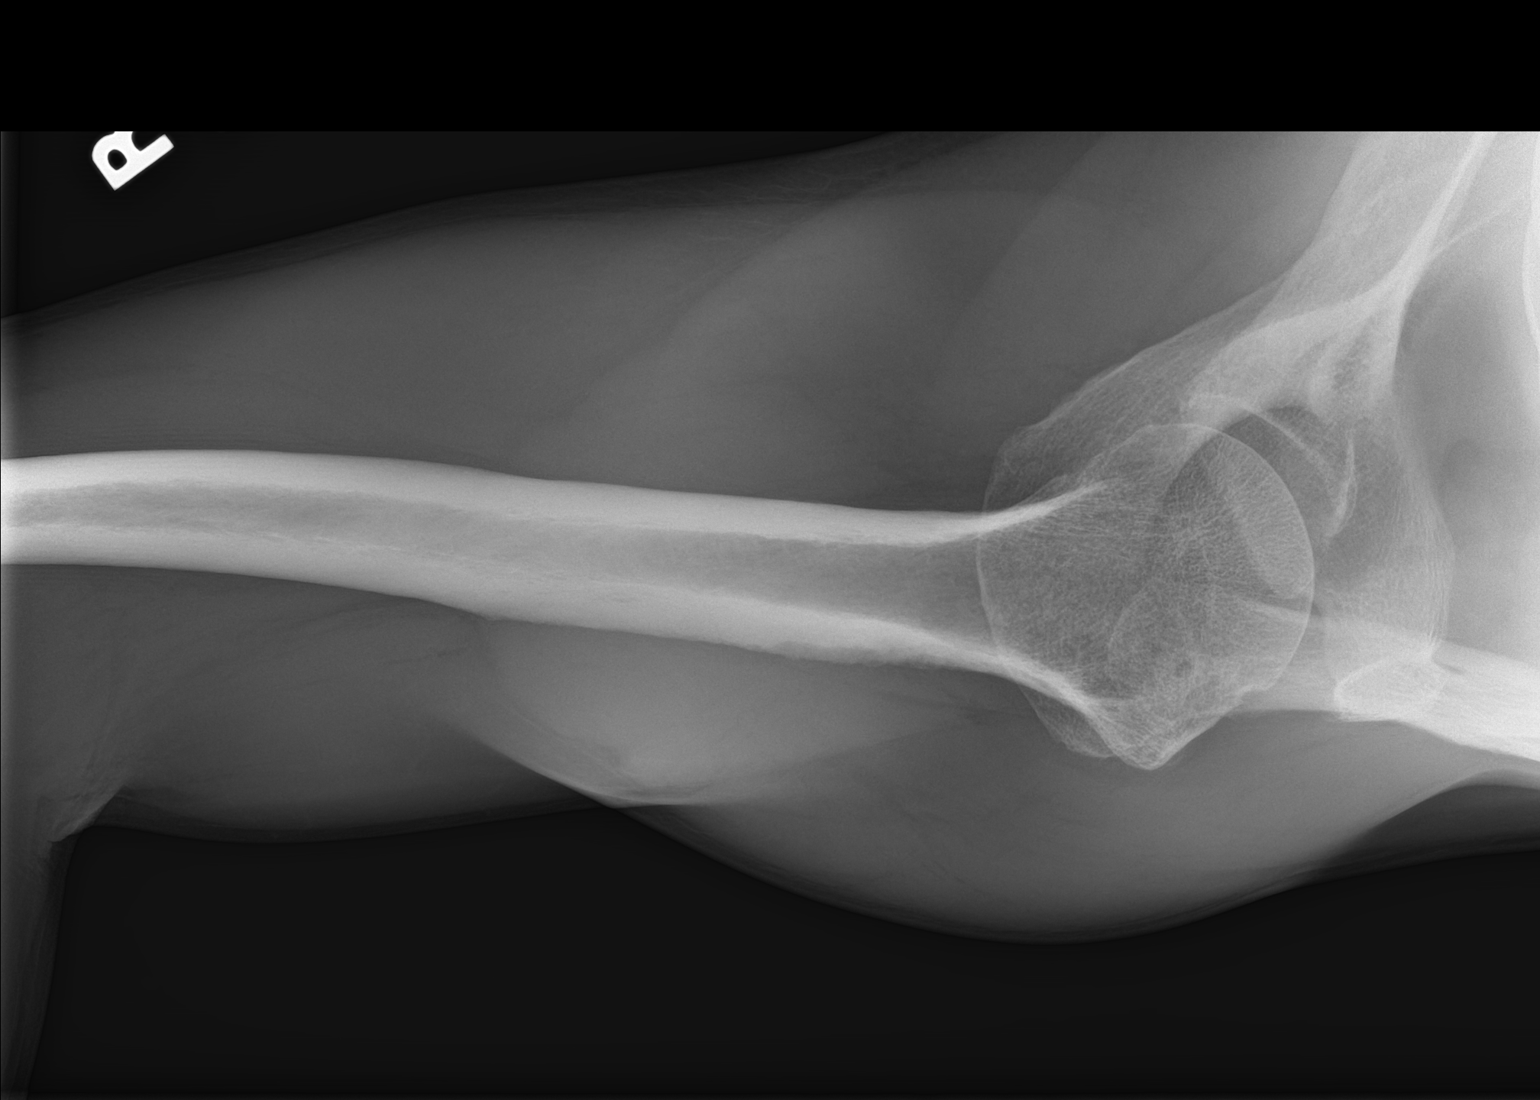

[3 of 3 positions shown; findings below may reference images not displayed]

FINDINGS: There is no evidence of fracture or dislocation. There is no
evidence of arthropathy or other focal bone abnormality. Soft
tissues are unremarkable.
IMPRESSION: Negative.

## 2016-05-10 IMAGING — DX DG CHEST 2V
2 series · 2 of 2 positions shown · non-contrast
Comparison: None.

CLINICAL DATA: Back pain for 1 year in right shoulder pain for
several months. Sudden onset chest pain 4 days ago.

EXAM:
CHEST  2 VIEW

[chest pa]
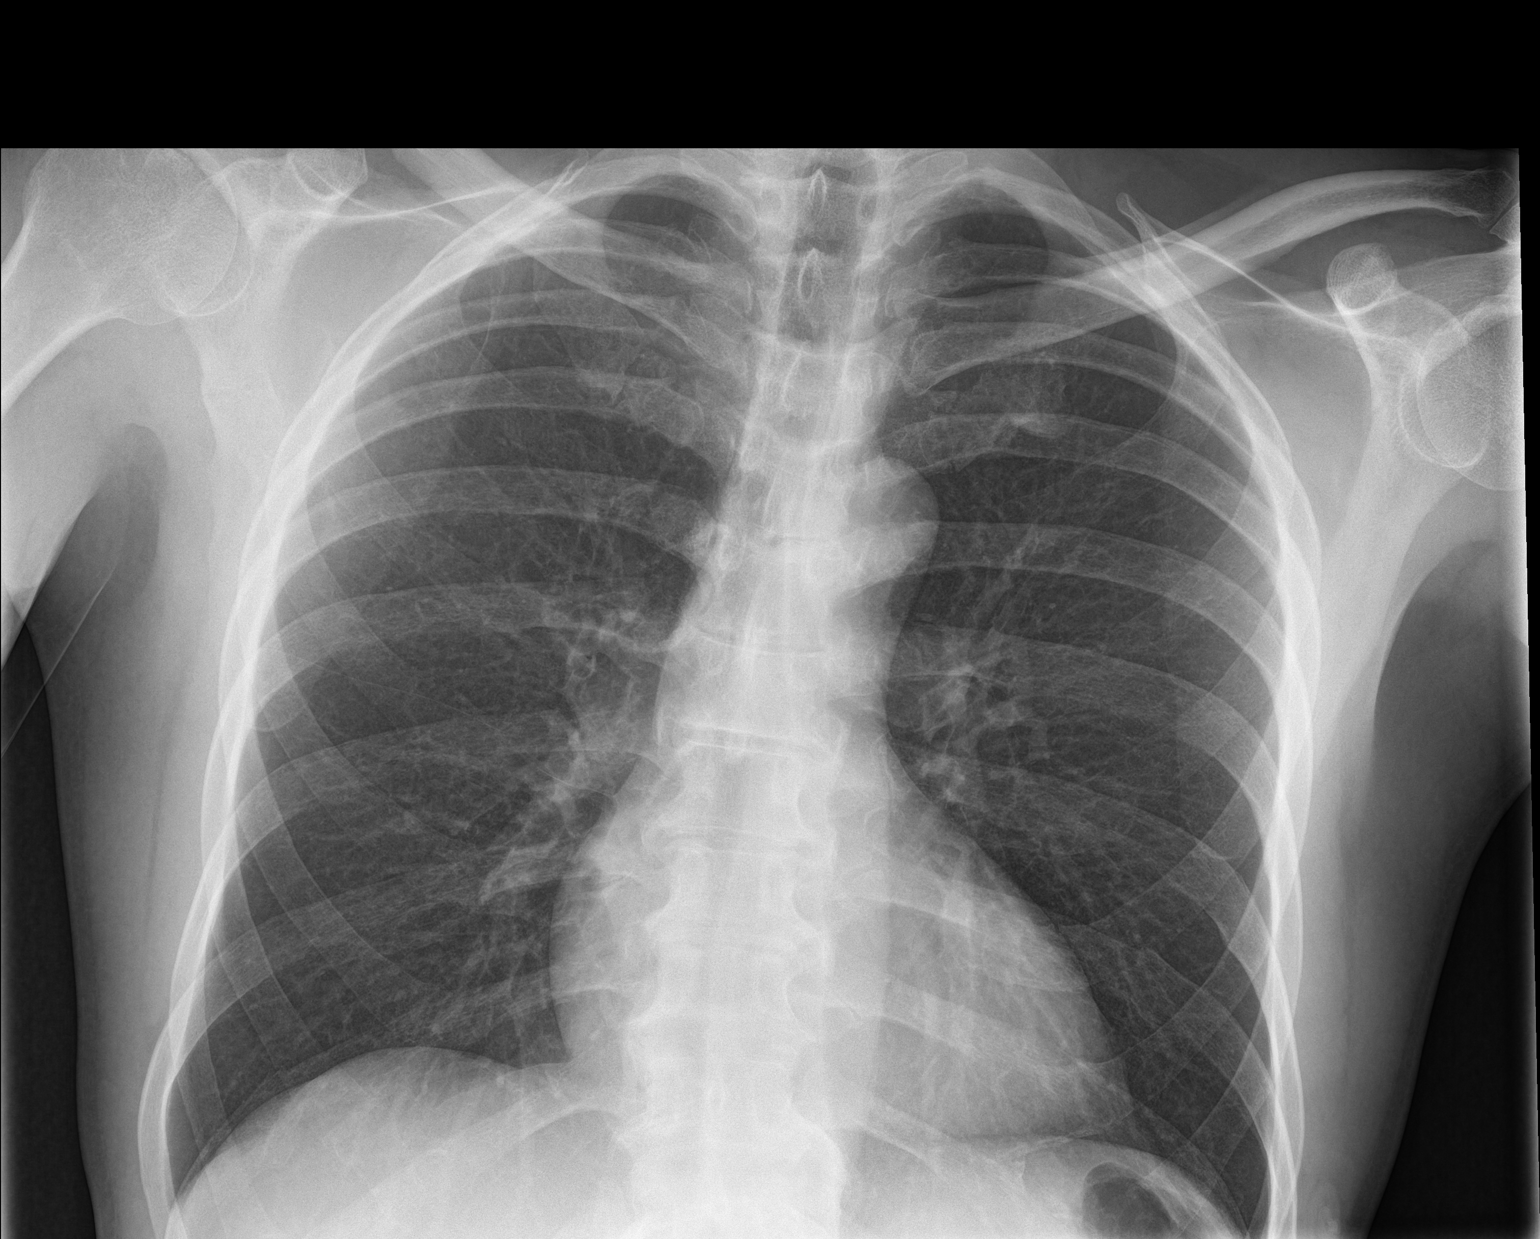

[chest lat]
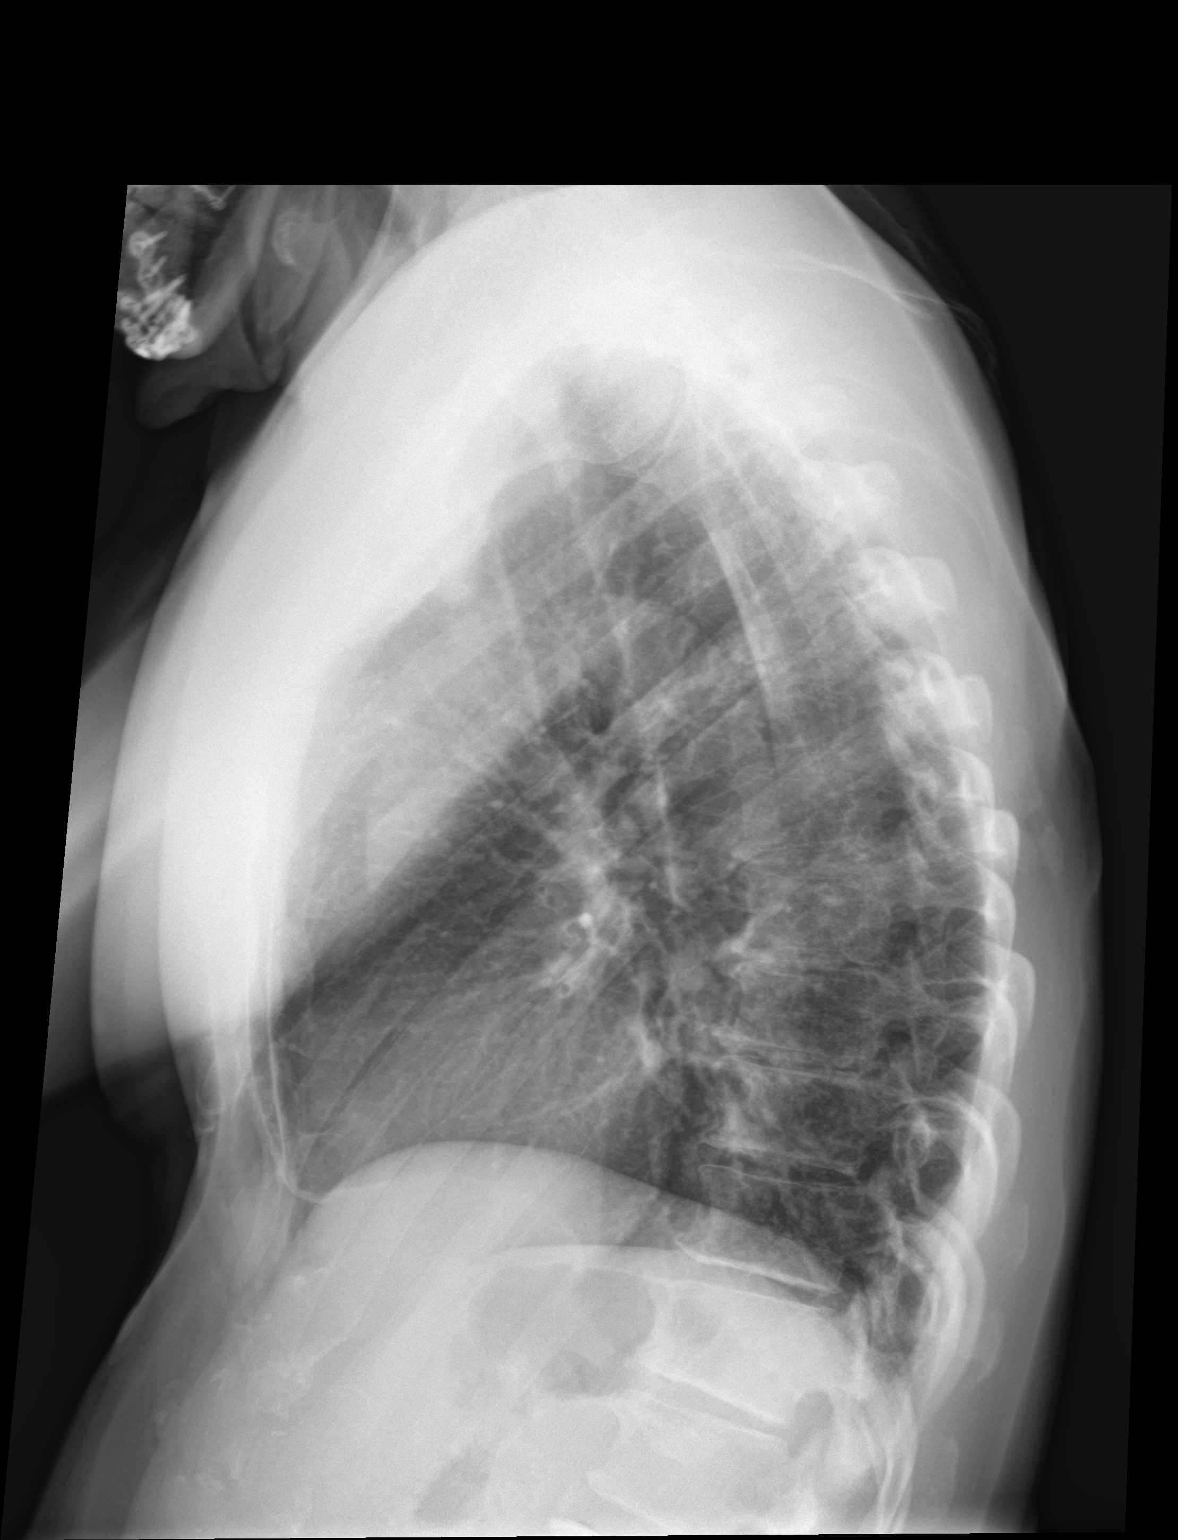

[2 of 2 positions shown; findings below may reference images not displayed]

FINDINGS: Mild hyperinflation. Normal heart size and pulmonary vascularity. No
focal airspace disease or consolidation in the lungs. No blunting of
costophrenic angles. No pneumothorax. Mediastinal contours appear
intact. Degenerative changes in the spine. Postoperative changes
incidentally noted in the mandibles.
IMPRESSION: No active cardiopulmonary disease.

## 2017-11-05 ENCOUNTER — Emergency Department (HOSPITAL_COMMUNITY)
Admission: EM | Admit: 2017-11-05 | Discharge: 2017-11-06 | Disposition: A | Discharge status: Home / Self Care | Payer: Self-pay | Attending: Emergency Medicine | Admitting: Emergency Medicine

## 2017-11-05 ENCOUNTER — Emergency Department (HOSPITAL_COMMUNITY): Payer: Self-pay

## 2017-11-05 ENCOUNTER — Encounter (HOSPITAL_COMMUNITY): Payer: Self-pay | Admitting: *Deleted

## 2017-11-05 ENCOUNTER — Other Ambulatory Visit: Payer: Self-pay

## 2017-11-05 DIAGNOSIS — Z79899 Other long term (current) drug therapy: Secondary | ICD-10-CM | POA: Insufficient documentation

## 2017-11-05 DIAGNOSIS — F17210 Nicotine dependence, cigarettes, uncomplicated: Secondary | ICD-10-CM | POA: Insufficient documentation

## 2017-11-05 DIAGNOSIS — M8700 Idiopathic aseptic necrosis of unspecified bone: Secondary | ICD-10-CM

## 2017-11-05 DIAGNOSIS — M25532 Pain in left wrist: Secondary | ICD-10-CM

## 2017-11-05 DIAGNOSIS — M1990 Unspecified osteoarthritis, unspecified site: Secondary | ICD-10-CM

## 2017-11-05 DIAGNOSIS — M87051 Idiopathic aseptic necrosis of right femur: Secondary | ICD-10-CM | POA: Insufficient documentation

## 2017-11-05 DIAGNOSIS — M25462 Effusion, left knee: Secondary | ICD-10-CM

## 2017-11-05 MED ORDER — HYDROCODONE-ACETAMINOPHEN 5-325 MG PO TABS: 1.0000 | ORAL_TABLET | ORAL | 0 refills | Status: DC | PRN

## 2017-11-05 MED ORDER — IBUPROFEN 800 MG PO TABS: 800.0000 mg | ORAL_TABLET | Freq: Three times a day (TID) | ORAL | 0 refills | Status: DC

## 2017-11-05 MED ORDER — HYDROCODONE-ACETAMINOPHEN 5-325 MG PO TABS
1.0000 | ORAL_TABLET | Freq: Once | ORAL | Status: AC
  Administered 2017-11-06: 1 via ORAL
  Filled 2017-11-05: qty 1

## 2017-11-05 MED ORDER — KETOROLAC TROMETHAMINE 30 MG/ML IJ SOLN
30.0000 mg | Freq: Once | INTRAMUSCULAR | Status: AC
  Administered 2017-11-05: 30 mg via INTRAMUSCULAR
  Filled 2017-11-05: qty 1

## 2017-11-05 NOTE — ED Triage Notes (Signed)
Pt c/o bilateral leg pain and left arm pain; pt has multiple complaints

## 2017-11-05 NOTE — ED Provider Notes (Signed)
[facility] EMERGENCY DEPARTMENT Provider Note   CSN: [id number] Arrival date & time: 11/05/17  2151     History   Chief Complaint Chief Complaint  Patient presents with  . Leg Pain    HPI Eddie Rocha is a 52 y.o. male with multiple complaints of body pain including his right hip, his left elbow and his left knee. Past medical history is significant for lumbar spondylosis with chronic pain, prior patient of Dr. Tessa Lerner with pain management, but was discharged for using marijuana.   He denies injury or overuse, but reports having a deep right hip and buttock pain for approx the past 8 months. With standing the pain feels deep in his right buttock, with sitting and lying down it is worsened in his groin and inner thigh.  He denies weakness or numbness in the leg but when he has a sharp catch of pain, it can nearly drop him to his knees. Secondly, he woke several days ago with left dorsal wrist swelling and pain. Again, denies injury or overuse.  His left knee has chronic pain as well and has noticed swelling behind the knee which is new over the past week.  He has found no alleviators for this pain.  The history is provided by the patient.    Past Medical History:  Diagnosis Date  . Chronic back pain   . Hernia     Patient Active Problem List   Diagnosis Date Noted  . Lumbar spondylosis 08/09/2014  . Closed bimalleolar fracture of left ankle 08/09/2014  . Neuropathic pain 08/22/2013  . Back pain, chronic 08/11/2013  . Spinal stenosis 08/11/2013  . 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 Fracture of lower jaw ([place]) 04/06/2013    Past Surgical History:  Procedure Laterality Date  . BACK SURGERY    . HERNIA REPAIR    . LAMINECTOMY  07/06/2013   T9    T10    CORD COMPRESSION  . LUMBAR LAMINECTOMY/DECOMPRESSION MICRODISCECTOMY N/A 07/06/2013   Procedure: LUMBAR LAMINECTOMY, DECOMPRESSION  T9 to T10;  Surgeon: Consuella Lose, MD;  Location: [place] [facility];  Service: Neurosurgery;  Laterality: N/A;  . MANDIBLE FRACTURE SURGERY          Home Medications    Prior to Admission medications   Medication Sig Start Date End Date Taking? Authorizing Provider  baclofen (LIORESAL) 10 MG tablet Take 1 tablet (10 mg total) by mouth every 8 (eight) hours as needed for muscle spasms. 08/09/14   Meredith Staggers, MD  gabapentin (NEURONTIN) 100 MG capsule Take 1 capsule (100 mg total) by mouth 3 (three) times daily. Patient not taking: Reported on 08/09/2014 01/31/14   Lorayne Marek, MD  HYDROcodone-acetaminophen (NORCO/VICODIN) 5-325 MG tablet Take 1 tablet by mouth every 4 (four) hours as needed. 11/05/17   Keani Gotcher, Almyra Free, PA-C  ibuprofen (ADVIL,MOTRIN) 800 MG tablet Take 1 tablet (800 mg total) by mouth 3 (three) times daily. 11/05/17   Evalee Jefferson, PA-C  naproxen (NAPROSYN) 500 MG tablet Take 1 tablet (500 mg total) by mouth 2 (two) times daily with a meal. 08/09/14   Meredith Staggers, MD  naproxen (NAPROSYN) 500 MG tablet Take 1 tablet (500 mg total) by mouth 2 (two) times daily. 11/18/14   Horton, Barbette Hair, MD    Family History Family History  Problem Relation Age of Onset  . Cancer Brother        throat  Social History Social History   Tobacco Use  . Smoking status: Current Every Day Smoker    Packs/day: 1.00    Years: 28.00    Pack years: 28.00    Types: Cigarettes  . Smokeless tobacco: Never Used  Substance Use Topics  . Alcohol use: Yes    Comment:    6 PK ON WEEKENDS  . Drug use: No     Allergies   Patient has no known allergies.   Review of Systems Review of Systems  Constitutional: Negative for fever.  Musculoskeletal: Positive for arthralgias and joint swelling. Negative for myalgias.  Neurological: Negative for weakness and numbness.     Physical Exam Updated Vital Signs BP 129/75 (BP Location: Right Arm)   Pulse 91   Temp 99 F (37.2 C) (Oral)   Resp 19   Wt 63.5 kg (140 lb)    SpO2 100%   BMI 21.29 kg/m   Physical Exam  Constitutional: He appears well-developed and well-nourished.  HENT:  Head: Atraumatic.  Neck: Normal range of motion.  Cardiovascular:  Pulses equal bilaterally  Musculoskeletal: He exhibits tenderness.       Left wrist: He exhibits bony tenderness and swelling.       Right hip: He exhibits no bony tenderness, no swelling, no crepitus and no deformity.       Left knee: He exhibits effusion. He exhibits no deformity, normal alignment, no LCL laxity and no MCL laxity. Tenderness found. Medial joint line tenderness noted.  Pain with internal right hip rotation at lateral hip.  Left knee with moderate fullness popliteal space.  ttp medial left knee.  Crepitus with ROM. Left wrist with fullnes noted at the dorsal medial wrist over scaphoid. Distal sensation intact.  Radial pulse intact.  Neurological: He is alert. He has normal strength. He displays normal reflexes. No sensory deficit.  Skin: Skin is warm and dry.  Psychiatric: He has a normal mood and affect.     ED Treatments / Results  Labs (all labs ordered are listed, but only abnormal results are displayed) Labs Reviewed - No data to display  EKG None  Radiology Dg Wrist Complete Left  Result Date: 11/05/2017 CLINICAL DATA:  Wrist pain EXAM: LEFT WRIST - COMPLETE 3+ VIEW COMPARISON:  None. FINDINGS: No acute displaced fracture is seen. Moderate arthritis at the first [facility] joint. Moderate-to-marked arthritis at the distal radiocarpal interval. Chest borderline to slightly widened scapholunate interval up to 3.5 mm. Suspected dorsal tilt of the scaphoid/DISI configuration at the wrist on the lateral view. Dorsal soft tissue swelling. Cysts in the scaphoid bone. IMPRESSION: 1. Diffuse soft tissue swelling without acute osseous abnormality. 2. Moderate-to-marked arthritis at the first [facility] joint and distal radiocarpal joint 3. Borderline to slight widening of the scapholunate interval as may  be seen with ligamentous abnormality with suspected DISI of the wrist. Electronically Signed   By: Donavan Foil M.D.   On: 11/05/2017 23:08   Dg Knee Complete 4 Views Left  Result Date: 11/05/2017 CLINICAL DATA:  Generalized knee pain EXAM: LEFT KNEE - COMPLETE 4+ VIEW COMPARISON:  08/01/2014 FINDINGS: No fracture or malalignment. Mild tricompartment degenerative changes with bony spurring and minimal narrowing. Moderate to large knee effusion. Vascular calcifications. IMPRESSION: 1. Mild degenerative changes.  No acute osseous abnormality 2. Moderate to large knee effusion Electronically Signed   By: Donavan Foil M.D.   On: 11/05/2017 23:04   Dg Hip Unilat W Or Wo Pelvis 2-3 Views Right  Result Date:  11/05/2017 CLINICAL DATA:  Chronic hip pain EXAM: DG HIP (WITH OR WITHOUT PELVIS) 2-3V RIGHT COMPARISON:  None. FINDINGS: SI joints are patent. The pubic symphysis and rami are intact. No fracture or malalignment. Mild arthritis of the right hip. Mixed lucency and sclerosis in the right femoral head. Vascular calcifications. IMPRESSION: 1. No acute osseous abnormality 2. Lucency and sclerosis in the right femoral head, suspicious for AVN. No femoral head collapse. Electronically Signed   By: Donavan Foil M.D.   On: 11/05/2017 23:09    Procedures Procedures (including critical care time)  Medications Ordered in ED Medications  ketorolac (TORADOL) 30 MG/ML injection 30 mg (30 mg Intramuscular Given 11/05/17 2249)  HYDROcodone-acetaminophen (NORCO/VICODIN) 5-325 MG per tablet 1 tablet (1 tablet Oral Given 11/06/17 0002)     Initial Impression / Assessment and Plan / ED Course  I have reviewed the triage vital signs and the nursing notes.  Pertinent labs & imaging results that were available during my care of the patient were reviewed by me and considered in my medical decision making (see chart for details).     Pt with multiple orthopedic complaints and findings on xrays secondary to degenerative  changes left knee and probable AVN (of the right hip).  Discussed findings on imaging of left wrist. Pt denies h/o trauma.  He was placed in a velcro wrist splint, knee sleeve also provided.  He will need ortho f/u.  Pt understands and will call for appt with Dr. Aline Brochure.  Final Clinical Impressions(s) / ED Diagnoses   Final diagnoses:  Knee effusion, left  Avascular necrosis (HCC)  Wrist pain, acute, left  Arthritis    ED Discharge Orders        Ordered    ibuprofen (ADVIL,MOTRIN) 800 MG tablet  3 times daily     11/05/17 2346    HYDROcodone-acetaminophen (NORCO/VICODIN) 5-325 MG tablet  Every 4 hours PRN     11/05/17 2346       Evalee Jefferson, PA-C 11/06/17 0011    Virgel Manifold, MD 11/06/17 1609

## 2017-11-05 NOTE — Discharge Instructions (Addendum)
As discussed, there were multiple abnormalities seen on your xrays today which can explain the pain in your hip, knee and wrist.  Call Dr Aline Brochure for an office visit for further management of todays findings. Take the medicines prescribed for pain and wear the wrist splint and knee sleeve if they offer comfort.  You may take the hydrocodone prescribed for pain relief.  This will make you drowsy - do not drive within 4 hours of taking this medication.

## 2017-11-06 NOTE — ED Notes (Signed)
Pt ambulatory to waiting room. Pt verbalized understanding of discharge instructions.   

## 2017-11-09 ENCOUNTER — Emergency Department (HOSPITAL_COMMUNITY)
Admission: EM | Admit: 2017-11-09 | Discharge: 2017-11-09 | Disposition: A | Discharge status: Home / Self Care | Payer: Self-pay

## 2020-11-30 ENCOUNTER — Emergency Department
Admission: EM | Admit: 2020-11-30 | Discharge: 2020-11-30 | Disposition: A | Discharge status: Home / Self Care | Payer: Medicaid Other | Attending: Emergency Medicine | Admitting: Emergency Medicine

## 2020-11-30 ENCOUNTER — Other Ambulatory Visit: Payer: Self-pay

## 2020-11-30 ENCOUNTER — Ambulatory Visit: Payer: Self-pay | Admitting: *Deleted

## 2020-11-30 ENCOUNTER — Emergency Department (HOSPITAL_COMMUNITY)
Admission: EM | Admit: 2020-11-30 | Discharge: 2020-11-30 | Discharge status: Against Medical Advice | Payer: Medicaid Other | Attending: Emergency Medicine | Admitting: Emergency Medicine

## 2020-11-30 ENCOUNTER — Encounter: Payer: Self-pay | Admitting: Emergency Medicine

## 2020-11-30 ENCOUNTER — Emergency Department (HOSPITAL_COMMUNITY): Payer: Medicaid Other

## 2020-11-30 ENCOUNTER — Encounter (HOSPITAL_COMMUNITY): Payer: Self-pay

## 2020-11-30 DIAGNOSIS — M25552 Pain in left hip: Secondary | ICD-10-CM | POA: Diagnosis not present

## 2020-11-30 DIAGNOSIS — M545 Low back pain, unspecified: Secondary | ICD-10-CM | POA: Diagnosis not present

## 2020-11-30 DIAGNOSIS — F17210 Nicotine dependence, cigarettes, uncomplicated: Secondary | ICD-10-CM | POA: Insufficient documentation

## 2020-11-30 DIAGNOSIS — G8929 Other chronic pain: Secondary | ICD-10-CM | POA: Insufficient documentation

## 2020-11-30 DIAGNOSIS — Z5321 Procedure and treatment not carried out due to patient leaving prior to being seen by health care provider: Secondary | ICD-10-CM | POA: Diagnosis not present

## 2020-11-30 DIAGNOSIS — M25551 Pain in right hip: Secondary | ICD-10-CM | POA: Diagnosis not present

## 2020-11-30 DIAGNOSIS — M25572 Pain in left ankle and joints of left foot: Secondary | ICD-10-CM | POA: Diagnosis not present

## 2020-11-30 MED ORDER — KETOROLAC TROMETHAMINE 30 MG/ML IJ SOLN
30.0000 mg | Freq: Once | INTRAMUSCULAR | Status: AC
  Administered 2020-11-30: 30 mg via INTRAMUSCULAR
  Filled 2020-11-30: qty 1

## 2020-11-30 MED ORDER — NAPROXEN 500 MG PO TABS: 500.0000 mg | ORAL_TABLET | Freq: Two times a day (BID) | ORAL | 0 refills | Status: DC

## 2020-11-30 MED ORDER — METHOCARBAMOL 500 MG PO TABS
1000.0000 mg | ORAL_TABLET | Freq: Once | ORAL | Status: AC
  Administered 2020-11-30: 1000 mg via ORAL
  Filled 2020-11-30: qty 2

## 2020-11-30 MED ORDER — OXYCODONE-ACETAMINOPHEN 5-325 MG PO TABS
1.0000 | ORAL_TABLET | Freq: Once | ORAL | Status: AC
  Administered 2020-11-30: 1 via ORAL
  Filled 2020-11-30: qty 1

## 2020-11-30 MED ORDER — TRAMADOL HCL 50 MG PO TABS: 50.0000 mg | ORAL_TABLET | Freq: Four times a day (QID) | ORAL | 0 refills | Status: DC | PRN

## 2020-11-30 NOTE — ED Triage Notes (Signed)
Pt is here today due to left lower and left hip pain. Pt has chronic back pain w/ history of back surgery and left hip pain

## 2020-11-30 NOTE — Telephone Encounter (Signed)
Patient called and when transferred to NT caller hung up or call disconnected. Attempted to call patient back to review symptoms of back pain. No answer, message stated phone # no longer in service.

## 2020-11-30 NOTE — Telephone Encounter (Signed)
Attempted to call patient on # listed on caller ID. #[phone] No answer, LMTCB [phone].

## 2020-11-30 NOTE — ED Notes (Signed)
Patient called for vitals with no response. Patient stated at 19:15 that he was leaving and ride was on his way

## 2020-11-30 NOTE — ED Provider Notes (Signed)
Emergency Medicine Provider Triage Evaluation Note  Eddie Rocha , a 55 y.o. male  was evaluated in triage.  Pt complains of right hip pain.  Has history of chronic right hip pain.  Was told he had avascular necrosis previously needed a hip replacement.  He is not recent follow with orthopedics.  He has chronic back pain which is at baseline.  He denies any recent falls or injuries.  He has chronic paresthesias in his lower extremities from prior spinal cord injury.  He has been ambulatory without difficulty.  Seen earlier [place] [facility].  I did not see any imaging performed at that time.  That he sent him home on tramadol and naproxen with follow-up with orthopedics.  He denies IV drug use, bowel or bladder incontinence, saddle paresthesia  Review of Systems  Positive: Right hip pain Negative: Fever, chills, bowel or bladder incontinence, saddle paresthesia  Physical Exam  There were no vitals taken for this visit. Gen:   Awake, no distress   Resp:  Normal effort  MSK:   Moves extremities without difficulty, generalized tenderness to right hip. Other:  Ambulatory here in the emergency department without difficulty  Medical Decision Making  Medically screening exam initiated at 3:04 PM.  Appropriate orders placed.  Eddie Rocha was informed that the remainder of the evaluation will be completed by another provider, this initial triage assessment does not replace that evaluation, and the importance of remaining in the ED until their evaluation is complete.  Acute on chronic hip pain   Jarome Trull A, PA-C 11/30/20 Heath, Ankit, MD 12/03/20 1546

## 2020-11-30 NOTE — ED Notes (Addendum)
See triage note  Presents with c/o's of lower back and right hip pain  States he bent over and developed pain to left lower back

## 2020-11-30 NOTE — ED Provider Notes (Signed)
[facility] Emergency Department Provider Note  ____________________________________________   Event Date/Time   First MD Initiated Contact with Patient 11/30/20 [phone]     (approximate)  I have reviewed the triage vital signs and the nursing notes.   HISTORY  Chief Complaint Back Pain   HPI Eddie Rocha is a 55 y.o. male to the ED with complaint of low back pain and right hip pain.  Patient states that he was told 2 years ago that he needed to have a hip replacement but did not follow-up with anyone because of COVID.  Patient states that he bent over this morning and developed some low back pain on the left.  He has been taking over-the-counter medication without any relief.  He denies any direct trauma recently to his back.  He has a history of chronic back pain.  Per medical records patient x-ray 2 years ago showed findings consistent with avascular necrosis of his right hip.  Currently rates pain as an 8 out of 10.         Past Medical History:  Diagnosis Date  . Chronic back pain   . Hernia     Patient Active Problem List   Diagnosis Date Noted  . Lumbar spondylosis 08/09/2014  . Closed bimalleolar fracture of left ankle 08/09/2014  . Neuropathic pain 08/22/2013  . Back pain, chronic 08/11/2013  . Spinal stenosis 08/11/2013  . 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 Fracture of lower jaw ([place]) 04/06/2013    Past Surgical History:  Procedure Laterality Date  . BACK SURGERY    . HERNIA REPAIR    . LAMINECTOMY  07/06/2013   T9    T10    CORD COMPRESSION  . LUMBAR LAMINECTOMY/DECOMPRESSION MICRODISCECTOMY N/A 07/06/2013   Procedure: LUMBAR LAMINECTOMY, DECOMPRESSION  T9 to T10;  Surgeon: Consuella Lose, MD;  Location: [place] [facility];  Service: Neurosurgery;  Laterality: N/A;  . MANDIBLE FRACTURE SURGERY      Prior to Admission medications   Medication Sig Start Date End Date  Taking? Authorizing Provider  naproxen (NAPROSYN) 500 MG tablet Take 1 tablet (500 mg total) by mouth 2 (two) times daily with a meal. 11/30/20  Yes Letitia Neri L, PA-C  traMADol (ULTRAM) 50 MG tablet Take 1 tablet (50 mg total) by mouth every 6 (six) hours as needed. 11/30/20  Yes Johnn Hai, PA-C    Allergies Patient has no known allergies.  Family History  Problem Relation Age of Onset  . Cancer Brother        throat    Social History Social History   Tobacco Use  . Smoking status: Current Every Day Smoker    Packs/day: 1.00    Years: 28.00    Pack years: 28.00    Types: Cigarettes  . Smokeless tobacco: Never Used  Substance Use Topics  . Alcohol use: Yes    Comment:    6 PK ON WEEKENDS  . Drug use: No    Review of Systems Constitutional: No fever/chills Eyes: No visual changes. ENT: No sore throat. Cardiovascular: Denies chest pain. Respiratory: Denies shortness of breath. Gastrointestinal: No abdominal pain.  No nausea, no vomiting.  No diarrhea.  Genitourinary: Negative for dysuria. Musculoskeletal: Positive chronic low back pain.  Positive chronic left hip pain. Skin: Negative for rash. Neurological: Negative for headaches, focal weakness or numbness. ____________________________________________   PHYSICAL EXAM:  VITAL SIGNS: ED Triage Vitals  Enc  Vitals Group     BP 11/30/20 0950 116/75     Pulse Rate 11/30/20 0950 86     Resp 11/30/20 0950 18     Temp 11/30/20 0950 98.2 F (36.8 C)     Temp Source 11/30/20 0950 Oral     SpO2 11/30/20 0950 98 %     Weight 11/30/20 0943 139 lb 15.9 oz (63.5 kg)     Height 11/30/20 0943 5\' 8"  (1.727 m)     Head Circumference --      Peak Flow --      Pain Score 11/30/20 0943 8     Pain Loc --      Pain Edu? --      Excl. in [place]? --    Constitutional: Alert and oriented. Well appearing and in no acute distress. Eyes: Conjunctivae are normal. PERRL. EOMI. Head: Atraumatic. Nose: No  congestion/rhinnorhea. Neck: No stridor.  No tenderness on palpation of cervical spine posteriorly. Cardiovascular: Normal rate, regular rhythm. Grossly normal heart sounds.  Good peripheral circulation. Respiratory: Normal respiratory effort.  No retractions. Lungs CTAB. Gastrointestinal: Soft and nontender. No distention. No CVA tenderness. Musculoskeletal: No tenderness is noted on palpation of the thoracic or upper lumbar spine.  There is minimal tenderness on palpation of the lower lumbar spine without step-offs.  There is marked tenderness on palpation of the left SI joint area with decreased range of motion secondary to increased pain.  Straight leg raises were negative.  Good muscle strength bilaterally.  5/5.  Patient is noted to ambulate in the hallway but has a slight limp due to his chronic right hip pain.  Leg raises were approximately 80 degrees without discomfort. Neurologic:  Normal speech and language.  Reflexes 2+ bilaterally.  No gross focal neurologic deficits are appreciated. No gait instability. Skin:  Skin is warm, dry and intact. Psychiatric: Mood and affect are normal. Speech and behavior are normal.  ____________________________________________   LABS (all labs ordered are listed, but only abnormal results are displayed)  Labs Reviewed - No data to display ____________________________________________   PROCEDURES  Procedure(s) performed (including Critical Care):  Procedures   ____________________________________________   INITIAL IMPRESSION / ASSESSMENT AND PLAN / ED COURSE  As part of my medical decision making, I reviewed the following data within the electronic MEDICAL RECORD NUMBER Notes from prior ED visits and [place] Controlled Substance Database    ----------------------------------------- 12:01 PM on 11/30/2020 ----------------------------------------- This provider went into talk to the patient about his pain medication and patient was on his abdomen  sleeping.  He was easily aroused and patient was told that he would still have issues with his back and also with his hip until he is seen by a orthopedist.  He is aware that his avascular necrosis in his hip is not going to get any better and that surgery is the only thing that is going to help his hip.  He needs to talk to an orthopedist about a hip replacement.  He is to follow-up with Dr. Sharlet Salina who is on-call for orthopedics.  A prescription for tramadol and naproxen was sent to his pharmacy as this was a medication that was given to him with the orthopedist that he saw in the room.  Patient left the exam room disgruntled as he states that his hip still hurts.  Dr. Nathanial Millman contact information and address was listed in his discharge papers.    ____________________________________________   FINAL CLINICAL IMPRESSION(S) / ED DIAGNOSES  Final diagnoses:  Bilateral low back pain without sciatica, unspecified chronicity  Chronic right hip pain     ED Discharge Orders         Ordered    traMADol (ULTRAM) 50 MG tablet  Every 6 hours PRN        11/30/20 1204    naproxen (NAPROSYN) 500 MG tablet  2 times daily with meals        11/30/20 1204          *Please note:  Eddie Rocha was evaluated in Emergency Department on 11/30/2020 for the symptoms described in the history of present illness. He was evaluated in the context of the global COVID-19 pandemic, which necessitated consideration that the patient might be at risk for infection with the SARS-CoV-2 virus that causes COVID-19. Institutional protocols and algorithms that pertain to the evaluation of patients at risk for COVID-19 are in a state of rapid change based on information released by regulatory bodies including the CDC and federal and state organizations. These policies and algorithms were followed during the patient's care in the ED.  Some ED evaluations and interventions may be delayed as a result of limited staffing during  and the pandemic.*   Note:  This document was prepared using Dragon voice recognition software and may include unintentional dictation errors.    Johnn Hai, PA-C 11/30/20 1452    Blake Divine, MD 11/30/20 [phone]

## 2020-11-30 NOTE — Discharge Instructions (Addendum)
Call make an appoint with Dr. Sharlet Salina who is on-call for orthopedics.  His office is at [facility] and his contact information is listed on your discharge papers.  A prescription for the medication that your doctor at [facility] gave you was sent to your pharmacy.  Take these medications as directed.  You may also use ice or heat to your back and hip as needed for discomfort.  Your hip will only continue to get worse and you will need to begin considering a hip replacement.

## 2020-11-30 NOTE — ED Triage Notes (Signed)
C/O left lower back and left hip pain.  Has chronic back pain with history of back surgery and left hip pain.  States he needs a hip replacement.

## 2021-01-30 ENCOUNTER — Emergency Department
Admission: EM | Admit: 2021-01-30 | Discharge: 2021-01-30 | Disposition: A | Discharge status: Home / Self Care | Payer: Medicaid Other | Attending: Emergency Medicine | Admitting: Emergency Medicine

## 2021-01-30 ENCOUNTER — Encounter: Payer: Self-pay | Admitting: *Deleted

## 2021-01-30 ENCOUNTER — Emergency Department: Payer: Medicaid Other

## 2021-01-30 ENCOUNTER — Other Ambulatory Visit: Payer: Self-pay

## 2021-01-30 DIAGNOSIS — M546 Pain in thoracic spine: Secondary | ICD-10-CM | POA: Diagnosis not present

## 2021-01-30 DIAGNOSIS — W1839XA Other fall on same level, initial encounter: Secondary | ICD-10-CM | POA: Diagnosis not present

## 2021-01-30 DIAGNOSIS — F17210 Nicotine dependence, cigarettes, uncomplicated: Secondary | ICD-10-CM | POA: Diagnosis not present

## 2021-01-30 DIAGNOSIS — Y92002 Bathroom of unspecified non-institutional (private) residence single-family (private) house as the place of occurrence of the external cause: Secondary | ICD-10-CM | POA: Diagnosis not present

## 2021-01-30 MED ORDER — KETOROLAC TROMETHAMINE 30 MG/ML IJ SOLN
30.0000 mg | Freq: Once | INTRAMUSCULAR | Status: AC
  Administered 2021-01-30: 30 mg via INTRAMUSCULAR
  Filled 2021-01-30: qty 1

## 2021-01-30 MED ORDER — METHOCARBAMOL 500 MG PO TABS: 500.0000 mg | ORAL_TABLET | Freq: Three times a day (TID) | ORAL | 0 refills | Status: DC | PRN

## 2021-01-30 MED ORDER — MELOXICAM 15 MG PO TABS: 15.0000 mg | ORAL_TABLET | Freq: Every day | ORAL | 2 refills | Status: DC

## 2021-01-30 NOTE — ED Triage Notes (Signed)
Pt fell last night getting out of the shower.  Pt has upper back pain.  Pt had surgery on back 5 years ago.   Pt reports swollen area at incision.  No loc  pt alert  speech clear.

## 2021-01-30 NOTE — ED Provider Notes (Signed)
[facility]-EMERGENCY DEPARTMENT  ____________________________________________  Time seen: Approximately 6:51 PM  I have reviewed the triage vital signs and the nursing notes.   HISTORY  Chief Complaint Back Pain   Historian Patient     HPI Eddie Rocha is a 55 y.o. male presents to the emergency department with upper back pain after patient had a fall.  Patient fell as he was getting out of the shower and is now having upper back pain near her surgical incision.  Patient had spinal cord decompression on 07/07/2013 by Dr. Kathyrn Sheriff.  He denies weakness of the upper and lower extremities or bowel or bladder incontinence.   Past Medical History:  Diagnosis Date   Chronic back pain    Hernia      Immunizations up to date:  Yes.     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 Authorizing Provider  meloxicam (MOBIC) 15 MG tablet Take 1 tablet (15 mg total) by mouth daily. 01/30/21 01/30/22 Yes Vallarie Mare M, PA-C  methocarbamol (ROBAXIN) 500 MG tablet Take 1 tablet (500 mg total) by mouth every 8 (eight)  hours as needed for up to 5 days. 01/30/21 02/04/21 Yes Vallarie Mare M, PA-C  naproxen (NAPROSYN) 500 MG tablet Take 1 tablet (500 mg total) by mouth 2 (two) times daily with a meal. 11/30/20   Johnn Hai, PA-C  traMADol (ULTRAM) 50 MG tablet Take 1 tablet (50 mg total) by mouth every 6 (six) hours as needed. 11/30/20   Johnn Hai, PA-C    Allergies Patient has no known allergies.  Family History  Problem Relation Age of Onset   Cancer Brother        throat    Social History Social History   Tobacco Use   Smoking status: Every Day    Packs/day: 1.00    Years: 28.00    Pack years: 28.00    Types: Cigarettes   Smokeless tobacco: Never  Substance Use Topics   Alcohol use: Not Currently    Comment:    6 PK ON WEEKENDS   Drug use: No     Review of Systems  Constitutional: No fever/chills Eyes:  No discharge ENT: No upper respiratory complaints. Respiratory: no cough. No SOB/ use of accessory muscles to breath Gastrointestinal:   No nausea, no vomiting.  No diarrhea.  No constipation. Musculoskeletal: Patient has upper back pain.  Skin: Negative for rash, abrasions, lacerations, ecchymosis.    ____________________________________________   PHYSICAL EXAM:  VITAL SIGNS: ED Triage Vitals  Enc Vitals Group     BP 01/30/21 1729 100/80     Pulse Rate 01/30/21 1729 (!) 113     Resp 01/30/21 1729 18     Temp 01/30/21 1729 98.3 F (36.8 C)     Temp Source 01/30/21 1729 Oral     SpO2 01/30/21 1729 97 %     Weight 01/30/21 1729 135 lb (61.2 kg)     Height 01/30/21 1729 '5\' 8"'$  (1.727 m)     Head Circumference --      Peak Flow --      Pain Score 01/30/21 1728 10     Pain Loc --      Pain Edu? --      Excl. in [place]? --      Constitutional: Alert and oriented. Well appearing and in no acute distress. Eyes: Conjunctivae are normal. PERRL. EOMI. Head: Atraumatic. ENT:      Nose: No congestion/rhinnorhea.      Mouth/Throat: Mucous membranes are moist.  Neck:  No stridor.  No cervical spine tenderness to palpation. Cardiovascular: Normal rate, regular rhythm. Normal S1 and S2.  Good peripheral circulation. Respiratory: Normal respiratory effort without tachypnea or retractions. Lungs CTAB. Good air entry to the bases with no decreased or absent breath sounds Gastrointestinal: Bowel sounds x 4 quadrants. Soft and nontender to palpation. No guarding or rigidity. No distention. Musculoskeletal: Full range of motion to all extremities. No obvious deformities noted.  Patient has reproducible midline thoracic spine tenderness. Neurologic:  Normal for age. No gross focal neurologic deficits are appreciated.  Skin:  Skin is warm, dry and intact. No rash noted. Psychiatric: Mood and affect are normal for age. Speech and behavior are normal.   ____________________________________________   LABS (all labs ordered are listed, but only abnormal results are displayed)  Labs Reviewed - No data to display ____________________________________________  EKG   ____________________________________________  RADIOLOGY Unk Pinto, personally viewed and evaluated these images (plain radiographs) as part of my medical decision making, as well as reviewing the written report by the radiologist.    DG Thoracic Spine 2 View  Result Date: 01/30/2021 CLINICAL DATA:  Fall last night getting out of the shower with upper back pain. EXAM: THORACIC SPINE 2 VIEWS COMPARISON:  Remote thoracic spine imaging 07/06/2013 FINDINGS: Normal alignment. There is disc space narrowing and endplate spurring throughout the thoracic spine, most prominently affecting T9-T10. No evidence of fracture. The vertebral body heights are preserved. No visualized focal bone lesion or bone destruction. No paravertebral soft tissue abnormality. IMPRESSION: 1. No acute fracture of the thoracic spine. 2. Multilevel degenerative disc disease. Electronically Signed   By: Keith Rake M.D.   On:  01/30/2021 19:28    ____________________________________________    PROCEDURES  Procedure(s) performed:     Procedures     Medications  ketorolac (TORADOL) 30 MG/ML injection 30 mg (30 mg Intramuscular Given 01/30/21 1944)     ____________________________________________   INITIAL IMPRESSION / ASSESSMENT AND PLAN / ED COURSE  Pertinent labs & imaging results that were available during my care of the patient were reviewed by me and considered in my medical decision making (see chart for details).      Assessment and Plan:  Upper back pain:  55 year old male presents to the emergency department with upper back pain after a fall.  Patient was tachycardic at triage but vital signs were otherwise reassuring.  Patient had reproducible tenderness along the thoracic spine.  No bony abnormality was  visualized on x-ray.  Patient was discharged with meloxicam and Robaxin.  Return precautions were given to return with new or worsening symptoms.    ____________________________________________  FINAL CLINICAL IMPRESSION(S) / ED DIAGNOSES  Final diagnoses:  Acute midline thoracic back pain      NEW MEDICATIONS STARTED DURING THIS VISIT:  ED Discharge Orders          Ordered    meloxicam (MOBIC) 15 MG tablet  Daily        01/30/21 1939    methocarbamol (ROBAXIN) 500 MG tablet  Every 8 hours PRN        01/30/21 1939                This chart was dictated using voice recognition software/Dragon. Despite best efforts to proofread, errors can occur which can change the meaning. Any change was purely unintentional.     Karren Cobble 01/30/21 2135    Blake Divine, MD 01/31/21 1534

## 2021-02-04 ENCOUNTER — Emergency Department
Admission: EM | Admit: 2021-02-04 | Discharge: 2021-02-04 | Disposition: A | Discharge status: Home / Self Care | Payer: Medicaid Other | Attending: Emergency Medicine | Admitting: Emergency Medicine

## 2021-02-04 DIAGNOSIS — G8929 Other chronic pain: Secondary | ICD-10-CM | POA: Insufficient documentation

## 2021-02-04 DIAGNOSIS — F17210 Nicotine dependence, cigarettes, uncomplicated: Secondary | ICD-10-CM | POA: Insufficient documentation

## 2021-02-04 DIAGNOSIS — M549 Dorsalgia, unspecified: Secondary | ICD-10-CM | POA: Diagnosis not present

## 2021-02-04 DIAGNOSIS — S300XXA Contusion of lower back and pelvis, initial encounter: Secondary | ICD-10-CM | POA: Insufficient documentation

## 2021-02-04 DIAGNOSIS — W19XXXA Unspecified fall, initial encounter: Secondary | ICD-10-CM | POA: Insufficient documentation

## 2021-02-04 DIAGNOSIS — M546 Pain in thoracic spine: Secondary | ICD-10-CM | POA: Insufficient documentation

## 2021-02-04 DIAGNOSIS — S20229A Contusion of unspecified back wall of thorax, initial encounter: Secondary | ICD-10-CM | POA: Diagnosis not present

## 2021-02-04 DIAGNOSIS — S3992XA Unspecified injury of lower back, initial encounter: Secondary | ICD-10-CM | POA: Diagnosis present

## 2021-02-04 DIAGNOSIS — S20229D Contusion of unspecified back wall of thorax, subsequent encounter: Secondary | ICD-10-CM

## 2021-02-04 MED ORDER — LIDOCAINE 5 % EX PTCH
1.0000 | MEDICATED_PATCH | Freq: Once | CUTANEOUS | Status: DC
  Administered 2021-02-04: 1 via TRANSDERMAL
  Filled 2021-02-04: qty 1

## 2021-02-04 MED ORDER — ORPHENADRINE CITRATE 30 MG/ML IJ SOLN
60.0000 mg | INTRAMUSCULAR | Status: AC
  Administered 2021-02-04: 60 mg via INTRAMUSCULAR
  Filled 2021-02-04: qty 2

## 2021-02-04 MED ORDER — TRAMADOL HCL 50 MG PO TABS: 50.0000 mg | ORAL_TABLET | Freq: Three times a day (TID) | ORAL | 0 refills | Status: AC | PRN

## 2021-02-04 MED ORDER — KETOROLAC TROMETHAMINE 30 MG/ML IJ SOLN
30.0000 mg | Freq: Once | INTRAMUSCULAR | Status: AC
  Administered 2021-02-04: 30 mg via INTRAMUSCULAR
  Filled 2021-02-04: qty 1

## 2021-02-04 MED ORDER — LIDOCAINE 5 % EX PTCH: 1.0000 | MEDICATED_PATCH | Freq: Two times a day (BID) | CUTANEOUS | 0 refills | Status: AC | PRN

## 2021-02-04 MED ORDER — NAPROXEN 500 MG PO TABS: 500.0000 mg | ORAL_TABLET | Freq: Two times a day (BID) | ORAL | 0 refills | Status: AC

## 2021-02-04 NOTE — Discharge Instructions (Addendum)
Take prescription medicines as prescribed.  Follow-up with [facility] for ongoing management of your chronic back pain.

## 2021-02-04 NOTE — ED Triage Notes (Signed)
Patient presents to ER with back pain. Patient reports he was seen in ER 01/30/21 and prescribed medications which are not helping pain. Patient ambulatory in triage. Patient A&OX3.

## 2021-02-04 NOTE — ED Provider Notes (Signed)
[facility] Emergency Department Provider Note ____________________________________________  Time seen: 2010  I have reviewed the triage vital signs and the nursing notes.  HISTORY  Chief Complaint  Back Pain   HPI Eddie Rocha is a 55 y.o. male the below medical history, presents to the ED for continued mid back pain.  Patient was evaluated in the ED 3 days prior, for back pain, he reports prescribed occasions and not helping with his pain.  Patient is using profanity to describe the medicine prescribed to him. He denies any interim injury, fall, or trauma.  He also denies any chest pain, shortness of breath, distal paresthesias, extremity weakness, or bladder or bowel incontinence.  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 Authorizing Provider  lidocaine (LIDODERM) 5 % Place 1 patch onto the skin every 12 (twelve) hours as needed for up to 10 days. Remove & Discard patch after 12 hours of wear each day. 02/04/21 02/14/21 Yes Saben Donigan, Dannielle Karvonen, PA-C  naproxen (NAPROSYN) 500 MG tablet  Take 1 tablet (500 mg total) by mouth 2 (two) times daily with a meal for 15 days. 02/04/21 02/19/21 Yes Reverie Vaquera, Dannielle Karvonen, PA-C  traMADol (ULTRAM) 50 MG tablet Take 1 tablet (50 mg total) by mouth 3 (three) times daily as needed for up to 5 days. 02/04/21 02/09/21  Keyle Doby, Dannielle Karvonen, PA-C    Allergies Patient has no known allergies.  Family History  Problem Relation Age of Onset   Cancer Brother        throat    Social History Social History   Tobacco Use   Smoking status: Every Day    Packs/day: 1.00    Years: 28.00    Pack years: 28.00    Types: Cigarettes   Smokeless tobacco: Never  Substance Use Topics   Alcohol use: Not Currently    Comment:    6 PK ON WEEKENDS   Drug use: No    Review of Systems  Constitutional: Negative for fever. Eyes: Negative for visual changes. ENT: Negative for sore throat. Cardiovascular: Negative for chest pain. Respiratory: Negative for shortness of breath. Gastrointestinal: Negative for abdominal pain, vomiting and diarrhea. Genitourinary: Negative for dysuria. Musculoskeletal: Positive for back pain. Skin: Negative for rash. Neurological: Negative for headaches, focal weakness or numbness. ____________________________________________  PHYSICAL EXAM:  VITAL SIGNS: ED Triage Vitals  Enc Vitals Group     BP 02/04/21 1853 98/85     Pulse Rate 02/04/21 1853 81  Resp 02/04/21 1853 18     Temp 02/04/21 1853 98.9 F (37.2 C)     Temp src --      SpO2 02/04/21 1853 98 %     Weight 02/04/21 1854 136 lb 11 oz (62 kg)     Height 02/04/21 1854 '5\' 8"'$  (1.727 m)     Head Circumference --      Peak Flow --      Pain Score 02/04/21 1854 8     Pain Loc --      Pain Edu? --      Excl. in [place]? --     Constitutional: Alert and oriented. Well appearing and in no distress. Head: Normocephalic and atraumatic. Eyes: Conjunctivae are normal. Normal extraocular movements Cardiovascular: Normal rate, regular rhythm. Normal distal  pulses. Respiratory: Normal respiratory effort.  Musculoskeletal: Nontender with normal range of motion in all extremities.  Patient with a stable, chronic midline thoracic scar, without signs of local induration, inflammation, erythema, or abrasion.  X-ray localizes his acute pain. Neurologic:  Normal gait without ataxia. Normal speech and language. No gross focal neurologic deficits are appreciated. Skin:  Skin is warm, dry and intact. No rash noted. Psychiatric: Mood and affect are normal. Patient exhibits appropriate insight and judgment. ____________________________________________   RADIOLOGY Official radiology report(s): No results found. ____________________________________________  PROCEDURES  Toradol 30 mg IM Norflex 60 mg IM Lidoderm patch 5% topical   Procedures ____________________________________________   INITIAL IMPRESSION / ASSESSMENT AND PLAN / ED COURSE  As part of my medical decision making, I reviewed the following data within the [place] chart reviewed and Notes from prior ED visits    Patient with subsequent ED evaluation of continued midline back pain after mechanical fall.  Patient returns to the ED with complaints of the previously prescribed anti-inflammatory muscle relaxer not helping with his pain.  Patient is animated and using profane language to describe how he feels like the medications provided to him.  His exam is otherwise stable and benign at this time without any red flags.  Patient will be discharged with prescriptions for Flexeril, Lidoderm patches, and a small prescription for Ultram.  He will follow-up with primary provider or [facility] as previously referred.    Eddie Rocha was evaluated in Emergency Department on 02/04/2021 for the symptoms described in the history of present illness. He was evaluated in the context of the global COVID-19 pandemic, which necessitated consideration that the patient might be  at risk for infection with the SARS-CoV-2 virus that causes COVID-19. Institutional protocols and algorithms that pertain to the evaluation of patients at risk for COVID-19 are in a state of rapid change based on information released by regulatory bodies including the CDC and federal and state organizations. These policies and algorithms were followed during the patient's care in the ED.  I reviewed the patient's prescription history over the last 12 months in the multi-state controlled substances database(s) that includes [place], [place], [place], [place], [place], [place], [place], [place], [place], [place], [place], [place], [place], and [place].  Results were notable for no current RX noted.  ____________________________________________  FINAL CLINICAL IMPRESSION(S) / ED DIAGNOSES  Final diagnoses:  Chronic midline thoracic back pain  Contusion of back, unspecified laterality, subsequent encounter      Melvenia Needles, PA-C 02/04/21 2029    Delman Kitten, MD 02/05/21 [phone]

## 2021-04-22 DIAGNOSIS — F17210 Nicotine dependence, cigarettes, uncomplicated: Secondary | ICD-10-CM | POA: Insufficient documentation

## 2021-04-22 DIAGNOSIS — G8929 Other chronic pain: Secondary | ICD-10-CM | POA: Diagnosis not present

## 2021-04-22 DIAGNOSIS — M5441 Lumbago with sciatica, right side: Secondary | ICD-10-CM | POA: Insufficient documentation

## 2021-04-22 DIAGNOSIS — M47816 Spondylosis without myelopathy or radiculopathy, lumbar region: Secondary | ICD-10-CM | POA: Diagnosis not present

## 2021-04-22 DIAGNOSIS — Z043 Encounter for examination and observation following other accident: Secondary | ICD-10-CM | POA: Diagnosis not present

## 2021-04-22 DIAGNOSIS — W010XXA Fall on same level from slipping, tripping and stumbling without subsequent striking against object, initial encounter: Secondary | ICD-10-CM | POA: Insufficient documentation

## 2021-04-22 DIAGNOSIS — M25551 Pain in right hip: Secondary | ICD-10-CM | POA: Diagnosis not present

## 2021-04-23 ENCOUNTER — Emergency Department (HOSPITAL_COMMUNITY): Payer: Medicaid Other

## 2021-04-23 ENCOUNTER — Other Ambulatory Visit: Payer: Self-pay

## 2021-04-23 ENCOUNTER — Encounter (HOSPITAL_COMMUNITY): Payer: Self-pay

## 2021-04-23 ENCOUNTER — Emergency Department (HOSPITAL_COMMUNITY)
Admission: EM | Admit: 2021-04-23 | Discharge: 2021-04-23 | Disposition: A | Discharge status: Home / Self Care | Payer: Medicaid Other | Attending: Emergency Medicine | Admitting: Emergency Medicine

## 2021-04-23 DIAGNOSIS — M25551 Pain in right hip: Secondary | ICD-10-CM | POA: Diagnosis not present

## 2021-04-23 DIAGNOSIS — W19XXXA Unspecified fall, initial encounter: Secondary | ICD-10-CM

## 2021-04-23 DIAGNOSIS — Z043 Encounter for examination and observation following other accident: Secondary | ICD-10-CM | POA: Diagnosis not present

## 2021-04-23 DIAGNOSIS — G8929 Other chronic pain: Secondary | ICD-10-CM

## 2021-04-23 DIAGNOSIS — M5441 Lumbago with sciatica, right side: Secondary | ICD-10-CM

## 2021-04-23 DIAGNOSIS — M47816 Spondylosis without myelopathy or radiculopathy, lumbar region: Secondary | ICD-10-CM | POA: Diagnosis not present

## 2021-04-23 MED ORDER — NAPROXEN 500 MG PO TABS: 500.0000 mg | ORAL_TABLET | Freq: Two times a day (BID) | ORAL | 0 refills | Status: DC

## 2021-04-23 MED ORDER — KETOROLAC TROMETHAMINE 30 MG/ML IJ SOLN
30.0000 mg | Freq: Once | INTRAMUSCULAR | Status: AC
  Administered 2021-04-23: 30 mg via INTRAMUSCULAR
  Filled 2021-04-23: qty 1

## 2021-04-23 NOTE — ED Notes (Signed)
Pt ambulatory to restroom w/o difficulty.

## 2021-04-23 NOTE — ED Triage Notes (Addendum)
Chronic sciatica pain for years. Started after his back surgery in 2014  Says that it is down his right hip and leg. Able to walk to bed from wheelchair.

## 2021-04-23 NOTE — Discharge Instructions (Addendum)
You were seen today for back and hip pain.  Your x-rays are reassuring.  There is no evidence of fracture.  Take naproxen as needed for pain.

## 2021-04-23 NOTE — ED Notes (Signed)
Patient transported to X-ray 

## 2021-04-23 NOTE — ED Provider Notes (Signed)
[facility] EMERGENCY DEPARTMENT Provider Note   CSN: [id number] Arrival date & time: 04/22/21  2358     History Chief Complaint  Patient presents with   Sciatica    Eddie Rocha is a 55 y.o. male.  HPI     Is a 55 year old male with a history of chronic back pain, lumbar spondylosis who presents with back pain and right hip pain.  Patient reports that he fell onto his buttock and back earlier today.  Did not hit his head or lose consciousness.  He reports 10 out of 10 pain in his back and down his right leg.  He also reports pain in his right hip.  Reports pain is similar to his chronic known sciatica but worsened today.  He did not take anything for the pain.  Denies weakness, numbness, tingling of the lower extremities.  Also reports some left great toe pain.  He states "I think I have gout."  No history of gout.  Denies any recent fevers or drug use.  Has been ambulatory.  Denies any weakness, numbness, tingling of lower extremities.  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   Fracture of lower jaw ([place]) 04/06/2013    Past Surgical History:  Procedure Laterality Date   BACK SURGERY     HERNIA REPAIR     LAMINECTOMY  07/06/2013   T9    T10    CORD COMPRESSION   LUMBAR LAMINECTOMY/DECOMPRESSION MICRODISCECTOMY N/A 07/06/2013   Procedure: LUMBAR LAMINECTOMY, DECOMPRESSION  T9 to T10;  Surgeon: Consuella Lose, MD;  Location: [place] [facility];  Service: Neurosurgery;  Laterality: N/A;   MANDIBLE FRACTURE SURGERY         Family History  Problem Relation Age of Onset   Cancer Brother        throat    Social  History   Tobacco Use   Smoking status: Every Day    Packs/day: 1.00    Years: 28.00    Pack years: 28.00    Types: Cigarettes   Smokeless tobacco: Never  Substance Use Topics   Alcohol use: Not Currently    Comment:    6 PK ON WEEKENDS   Drug use: No    Home Medications Prior to Admission medications   Medication Sig Start Date End Date Taking? Authorizing Provider  naproxen (NAPROSYN) 500 MG tablet Take 1 tablet (500 mg total) by mouth 2 (two) times daily. 04/23/21  Yes Darrly Loberg, Barbette Hair, MD    Allergies    Patient has no known allergies.  Review of Systems   Review of Systems  Constitutional:  Negative for fever.  Respiratory:  Negative for shortness of breath.   Cardiovascular:  Negative for chest pain.  Gastrointestinal:  Negative for abdominal pain.  Musculoskeletal:  Positive for back pain.       Right hip pain  Neurological:  Negative for weakness and numbness.  All other systems reviewed and are negative.  Physical Exam Updated Vital Signs BP 112/65   Pulse 89   Temp 97.9 F (36.6 C) (Oral)   Resp 19   Ht 1.727  m (5\' 8" )   Wt 62 kg   SpO2 96%   BMI 20.78 kg/m   Physical Exam Vitals and nursing note reviewed.  Constitutional:      Appearance: He is well-developed.     Comments: Disheveled appearing but nontoxic, no acute distress  HENT:     Head: Normocephalic and atraumatic.     Nose: Nose normal.     Mouth/Throat:     Mouth: Mucous membranes are moist.  Eyes:     Pupils: Pupils are equal, round, and reactive to light.  Cardiovascular:     Rate and Rhythm: Normal rate and regular rhythm.     Heart sounds: Normal heart sounds.  Pulmonary:     Effort: Pulmonary effort is normal. No respiratory distress.     Breath sounds: Normal breath sounds. No wheezing.  Abdominal:     Palpations: Abdomen is soft.     Tenderness: There is no abdominal tenderness.  Musculoskeletal:        General: No deformity.     Cervical back: Neck supple.      Comments: No tenderness to palpation or warmth noted to the left great toe  Lymphadenopathy:     Cervical: No cervical adenopathy.  Skin:    General: Skin is warm and dry.  Neurological:     Mental Status: He is alert and oriented to person, place, and time.     Comments: 5 out of 5 strength bilateral lower extremities, pain with hip flexion on the right, positive straight leg raise, no clonus  Psychiatric:        Mood and Affect: Mood normal.    ED Results / Procedures / Treatments   Labs (all labs ordered are listed, but only abnormal results are displayed) Labs Reviewed - No data to display  EKG None  Radiology DG Lumbar Spine Complete  Result Date: 04/23/2021 CLINICAL DATA:  Fall EXAM: LUMBAR SPINE - COMPLETE 4+ VIEW COMPARISON:  None. FINDINGS: Five lumbar type vertebral bodies. Normal lumbar lordosis. No evidence of fracture or dislocation. Vertebral body heights are maintained. Mild to moderate degenerative changes, most prominent at L3-4. Visualized bony pelvis appears intact. IMPRESSION: No fracture or dislocation is seen. Mild to moderate degenerative changes. Electronically Signed   By: Julian Hy M.D.   On: 04/23/2021 01:10   DG HIP UNILAT WITH PELVIS 2-3 VIEWS RIGHT  Result Date: 04/23/2021 CLINICAL DATA:  Fall, right hip pain EXAM: DG HIP (WITH OR WITHOUT PELVIS) 2-3V RIGHT COMPARISON:  11/30/2020 FINDINGS: No fracture or dislocation is seen. Avascular necrosis of the right femoral head with mild collapse, unchanged. Mild degenerative changes of the right hip. Left hip joint space is preserved. Visualized bony pelvis appears intact. Degenerative changes of the lower lumbar spine. IMPRESSION: No fracture or dislocation is seen. Chronic avascular necrosis of the right femoral head. Electronically Signed   By: Julian Hy M.D.   On: 04/23/2021 01:11    Procedures Procedures   Medications Ordered in ED Medications  ketorolac (TORADOL) 30 MG/ML injection 30  mg (30 mg Intramuscular Given 04/23/21 0037)    ED Course  I have reviewed the triage vital signs and the nursing notes.  Pertinent labs & imaging results that were available during my care of the patient were reviewed by me and considered in my medical decision making (see chart for details).    MDM Rules/Calculators/A&P  Patient presents with acute on chronic back pain and hip pain.  Reports fall earlier today.  He is nontoxic and vital signs reassuring.  No signs or symptoms of cauda equina.  Given fall, will obtain imaging to ensure no fracture.  Imaging shows no evidence of acute fracture.  Patient was given Toradol.  Recommend anti-inflammatories such as naproxen.  After history, exam, and medical workup I feel the patient has been appropriately medically screened and is safe for discharge home. Pertinent diagnoses were discussed with the patient. Patient was given return precautions.  Final Clinical Impression(s) / ED Diagnoses Final diagnoses:  Chronic midline low back pain with right-sided sciatica  Fall, initial encounter    Rx / DC Orders ED Discharge Orders          Ordered    naproxen (NAPROSYN) 500 MG tablet  2 times daily        04/23/21 0118             Tiona Ruane, Barbette Hair, MD 04/23/21 0121

## 2021-04-24 ENCOUNTER — Telehealth: Payer: Self-pay

## 2021-04-24 NOTE — Telephone Encounter (Signed)
Transition Care Management Unsuccessful Follow-up Telephone Call  Date of discharge and from where:  04/23/2021-[place]   Attempts:  1st Attempt  Reason for unsuccessful TCM follow-up call:  Left voice message

## 2021-04-25 NOTE — Telephone Encounter (Signed)
Transition Care Management Unsuccessful Follow-up Telephone Call  Date of discharge and from where:  04/23/2021 from [facility]  Attempts:  2nd Attempt  Reason for unsuccessful TCM follow-up call:  Left voice message

## 2021-04-26 NOTE — Telephone Encounter (Signed)
Transition Care Management Unsuccessful Follow-up Telephone Call  Date of discharge and from where:  04/23/2021 from [facility]  Attempts:  3rd Attempt  Reason for unsuccessful TCM follow-up call:  Unable to reach patient

## 2021-06-25 ENCOUNTER — Telehealth: Payer: Self-pay

## 2021-06-25 NOTE — Telephone Encounter (Signed)
.. °  Medicaid Managed Care   Unsuccessful Outreach Note  06/25/2021 Name: Eddie Rocha MRN: [id number] DOB: January 19, 1966  Referred by: Patient, No Pcp Per (Inactive) Reason for referral : High Risk Managed Medicaid (I called the patient today to get him scheduled with the MM Team. Person that answered hung up or call was d/c after I identified myself.)   An unsuccessful telephone outreach was attempted today. The patient was referred to the case management team for assistance with care management and care coordination.   Follow Up Plan: The care management team will reach out to the patient again over the next 14 days.   [place]

## 2021-07-30 ENCOUNTER — Telehealth: Payer: Self-pay

## 2021-07-30 NOTE — Telephone Encounter (Signed)
.. °  Medicaid Managed Care   Unsuccessful Outreach Note  07/30/2021 Name: Eddie Rocha MRN: [id number] DOB: Nov 16, 1965  Referred by: Patient, No Pcp Per (Inactive) Reason for referral : High Risk Managed Medicaid (I called the patient today to get him scheduled with the MM Team. I left my name and number on his VM.)   A second unsuccessful telephone outreach was attempted today. The patient was referred to the case management team for assistance with care management and care coordination.   Follow Up Plan: The care management team will reach out to the patient again over the next 14 days.   [place]

## 2021-08-06 ENCOUNTER — Other Ambulatory Visit: Payer: Self-pay

## 2021-08-06 ENCOUNTER — Emergency Department
Admission: EM | Admit: 2021-08-06 | Discharge: 2021-08-06 | Disposition: A | Discharge status: Home / Self Care | Payer: Medicaid Other | Attending: Emergency Medicine | Admitting: Emergency Medicine

## 2021-08-06 DIAGNOSIS — B9689 Other specified bacterial agents as the cause of diseases classified elsewhere: Secondary | ICD-10-CM | POA: Diagnosis not present

## 2021-08-06 DIAGNOSIS — N390 Urinary tract infection, site not specified: Secondary | ICD-10-CM | POA: Insufficient documentation

## 2021-08-06 DIAGNOSIS — R319 Hematuria, unspecified: Secondary | ICD-10-CM | POA: Diagnosis present

## 2021-08-06 LAB — URINALYSIS, ROUTINE W REFLEX MICROSCOPIC
Glucose, UA: NEGATIVE mg/dL
Ketones, ur: 15 mg/dL — AB
Nitrite: NEGATIVE
Protein, ur: 100 mg/dL — AB
Specific Gravity, Urine: 1.025 (ref 1.005–1.030)
pH: 5.5 (ref 5.0–8.0)

## 2021-08-06 LAB — BASIC METABOLIC PANEL
Anion gap: 9 (ref 5–15)
BUN: 23 mg/dL — ABNORMAL HIGH (ref 6–20)
CO2: 25 mmol/L (ref 22–32)
Calcium: 9.1 mg/dL (ref 8.9–10.3)
Chloride: 102 mmol/L (ref 98–111)
Creatinine, Ser: 0.99 mg/dL (ref 0.61–1.24)
GFR, Estimated: >60 mL/min (ref >60)
Glucose, Bld: 94 mg/dL (ref 70–99)
Potassium: 3.6 mmol/L (ref 3.5–5.1)
Sodium: 136 mmol/L (ref 135–145)

## 2021-08-06 LAB — CBC
HCT: 45.3 % (ref 39.0–52.0)
Hemoglobin: 15.1 g/dL (ref 13.0–17.0)
MCH: 32.3 pg (ref 26.0–34.0)
MCHC: 33.3 g/dL (ref 30.0–36.0)
MCV: 96.8 fL (ref 80.0–100.0)
Platelets: 318 10*3/uL (ref 150–400)
RBC: 4.68 MIL/uL (ref 4.22–5.81)
RDW: 13.3 % (ref 11.5–15.5)
WBC: 6.4 10*3/uL (ref 4.0–10.5)
nRBC: 0 % (ref 0.0–0.2)

## 2021-08-06 LAB — URINALYSIS, MICROSCOPIC (REFLEX)
RBC / HPF: >50 RBC/hpf (ref 0–5)
WBC, UA: >50 WBC/hpf (ref 0–5)

## 2021-08-06 MED ORDER — TRAMADOL HCL 50 MG PO TABS
50.0000 mg | ORAL_TABLET | Freq: Four times a day (QID) | ORAL | 0 refills | Status: DC | PRN
Start: 2021-08-06 — End: 2021-11-28

## 2021-08-06 MED ORDER — CEPHALEXIN 500 MG PO CAPS: 500.0000 mg | ORAL_CAPSULE | Freq: Two times a day (BID) | ORAL | 0 refills | Status: DC

## 2021-08-06 NOTE — ED Provider Notes (Signed)
[facility] Provider Note    Event Date/Time   First MD Initiated Contact with Patient 08/06/21 1401     (approximate)   History   Hematuria   HPI  Eddie Rocha is a 56 y.o. male who presents with complaints of hematuria and dysuria.  Patient reports symptoms been ongoing for almost a week.  He reports burning with urination followed by few drops of blood after urination.  No flank pain.  He reports this is happened in the past but resolved spontaneously.  No scrotal pain.  No sexual activity for over several months.  No concern for STIs.     Physical Exam   Triage Vital Signs: ED Triage Vitals [08/06/21 1304]  Enc Vitals Group     BP 109/76     Pulse Rate 96     Resp 17     Temp 98.4 F (36.9 C)     Temp Source Oral     SpO2 97 %     Weight 62.1 kg (137 lb)     Height 1.727 m (5\' 8" )     Head Circumference      Peak Flow      Pain Score      Pain Loc      Pain Edu?      Excl. in [place]?     Most recent vital signs: Vitals:   08/06/21 1304  BP: 109/76  Pulse: 96  Resp: 17  Temp: 98.4 F (36.9 C)  SpO2: 97%     General: Awake, no distress.  CV:  Good peripheral perfusion.  Resp:  Normal effort.  Abd:  No distention.  Other:  GU: No discharge, no scrotal swelling or discomfort   ED Results / Procedures / Treatments   Labs (all labs ordered are listed, but only abnormal results are displayed) Labs Reviewed  URINALYSIS, ROUTINE W REFLEX MICROSCOPIC - Abnormal; Notable for the following components:      Result Value   Hgb urine dipstick LARGE (*)    Bilirubin Urine SMALL (*)    Ketones, ur 15 (*)    Protein, ur 100 (*)    Leukocytes,Ua TRACE (*)    All other components within normal limits  BASIC METABOLIC PANEL - Abnormal; Notable for the following components:   BUN 23 (*)    All other components within normal limits  URINALYSIS, MICROSCOPIC (REFLEX) - Abnormal; Notable for the following components:   Bacteria, UA  FEW (*)    All other components within normal limits  CBC     EKG     RADIOLOGY     PROCEDURES:  Critical Care performed:   Procedures   MEDICATIONS ORDERED IN ED: Medications - No data to display   IMPRESSION / MDM / [place] / ED COURSE  I reviewed the triage vital signs and the nursing notes.  Patient presents with dysuria hematuria as above.  Suspicious for urinary tract infection versus STI.  Lab work demonstrates normal CBC, normal BMP.  No flank pain or abdominal pain  Urinalysis demonstrates hemoglobin leukocytes and some bacteria suspicious for UTI.  Will start the patient on Keflex, outpatient follow-up as needed, return precautions discussed.          FINAL CLINICAL IMPRESSION(S) / ED DIAGNOSES   Final diagnoses:  Lower urinary tract infectious disease     Rx / DC Orders   ED Discharge Orders          Ordered  cephALEXin (KEFLEX) 500 MG capsule  2 times daily        08/06/21 1412    traMADol (ULTRAM) 50 MG tablet  Every 6 hours PRN        08/06/21 1412             Note:  This document was prepared using Dragon voice recognition software and may include unintentional dictation errors.   Lavonia Drafts, MD 08/06/21 [phone]

## 2021-08-06 NOTE — ED Triage Notes (Signed)
Pt c/o blood in urine with painful urination for the past 2 weeks, states it has happened before but normally just goes away, states this is the first time seeking care for his sx.

## 2021-08-07 ENCOUNTER — Telehealth: Payer: Self-pay | Admitting: *Deleted

## 2021-08-07 NOTE — Telephone Encounter (Signed)
Transition Care Management Follow-up Telephone Call Date of discharge and from where: 08/06/2021 [facility] ED How have you been since you were released from the hospital? "I am okay" Any questions or concerns? No  Items Reviewed: Did the pt receive and understand the discharge instructions provided? Yes  Medications obtained and verified? Yes  Other? No  Any new allergies since your discharge? No  Dietary orders reviewed? No Do you have support at home? Yes    Functional Questionnaire: (I = Independent and D = Dependent) ADLs: I  Bathing/Dressing- I  Meal Prep- I  Eating- I  Maintaining continence- I  Transferring/Ambulation- I  Managing Meds- I  Follow up appointments reviewed:  PCP Hospital f/u appt confirmed? No   - No PCP [facility] f/u appt confirmed? No   Are transportation arrangements needed? No  If their condition worsens, is the pt aware to call PCP or go to the Emergency Dept.? Yes Was the patient provided with contact information for the PCP's office or ED? Yes Was to pt encouraged to call back with questions or concerns? Yes

## 2021-08-12 DIAGNOSIS — M87851 Other osteonecrosis, right femur: Secondary | ICD-10-CM | POA: Diagnosis not present

## 2021-08-22 ENCOUNTER — Telehealth: Payer: Self-pay

## 2021-08-22 NOTE — Telephone Encounter (Signed)
..  Mr.  .. [facility] Managed Care   Unsuccessful Outreach Note  08/22/2021 Name: Eddie Rocha MRN: [id number] DOB: Nov 20, 1965  Referred by: Patient, No Pcp Per (Inactive) Reason for referral : High Risk Managed Medicaid (Third attempt to reach the patient to get him scheduled with the  MM team. I left my name and number on his VM.)   Third unsuccessful telephone outreach was attempted today. The patient was referred to the case management team for assistance with care management and care coordination. The patient's primary care provider has been notified of our unsuccessful attempts to make or maintain contact with the patient. The care management team is pleased to engage with this patient at any time in the future should he/she be interested in assistance from the care management team.   Follow Up Plan: We have been unable to make contact with the patient for follow up. The care management team is available to follow up with the patient after provider conversation with the patient regarding recommendation for care management engagement and subsequent re-referral to the care management team.   [place], [place]

## 2021-10-01 DIAGNOSIS — M87859 Other osteonecrosis, unspecified femur: Secondary | ICD-10-CM | POA: Diagnosis not present

## 2021-10-01 DIAGNOSIS — M5416 Radiculopathy, lumbar region: Secondary | ICD-10-CM | POA: Diagnosis not present

## 2021-10-08 DIAGNOSIS — M5416 Radiculopathy, lumbar region: Secondary | ICD-10-CM | POA: Diagnosis not present

## 2021-10-08 DIAGNOSIS — M48061 Spinal stenosis, lumbar region without neurogenic claudication: Secondary | ICD-10-CM | POA: Diagnosis not present

## 2021-10-09 ENCOUNTER — Emergency Department
Admission: EM | Admit: 2021-10-09 | Discharge: 2021-10-09 | Disposition: A | Discharge status: Home / Self Care | Payer: Medicaid Other | Attending: Emergency Medicine | Admitting: Emergency Medicine

## 2021-10-09 ENCOUNTER — Emergency Department: Payer: Medicaid Other

## 2021-10-09 ENCOUNTER — Other Ambulatory Visit: Payer: Self-pay

## 2021-10-09 ENCOUNTER — Encounter: Payer: Self-pay | Admitting: Emergency Medicine

## 2021-10-09 DIAGNOSIS — N3289 Other specified disorders of bladder: Secondary | ICD-10-CM | POA: Insufficient documentation

## 2021-10-09 DIAGNOSIS — M5416 Radiculopathy, lumbar region: Secondary | ICD-10-CM | POA: Diagnosis not present

## 2021-10-09 DIAGNOSIS — C679 Malignant neoplasm of bladder, unspecified: Secondary | ICD-10-CM | POA: Diagnosis not present

## 2021-10-09 DIAGNOSIS — I700 Atherosclerosis of aorta: Secondary | ICD-10-CM | POA: Diagnosis not present

## 2021-10-09 DIAGNOSIS — N390 Urinary tract infection, site not specified: Secondary | ICD-10-CM | POA: Insufficient documentation

## 2021-10-09 DIAGNOSIS — R109 Unspecified abdominal pain: Secondary | ICD-10-CM | POA: Diagnosis not present

## 2021-10-09 DIAGNOSIS — R188 Other ascites: Secondary | ICD-10-CM | POA: Diagnosis not present

## 2021-10-09 LAB — URINALYSIS, ROUTINE W REFLEX MICROSCOPIC
Bilirubin Urine: NEGATIVE
Glucose, UA: NEGATIVE mg/dL
Hgb urine dipstick: NEGATIVE
Ketones, ur: NEGATIVE mg/dL
Leukocytes,Ua: NEGATIVE
Nitrite: NEGATIVE
Protein, ur: 30 mg/dL — AB
Specific Gravity, Urine: 1.019 (ref 1.005–1.030)
pH: 6 (ref 5.0–8.0)

## 2021-10-09 LAB — CBC
HCT: 43.8 % (ref 39.0–52.0)
Hemoglobin: 14.4 g/dL (ref 13.0–17.0)
MCH: 32.1 pg (ref 26.0–34.0)
MCHC: 32.9 g/dL (ref 30.0–36.0)
MCV: 97.8 fL (ref 80.0–100.0)
Platelets: 323 10*3/uL (ref 150–400)
RBC: 4.48 MIL/uL (ref 4.22–5.81)
RDW: 15.5 % (ref 11.5–15.5)
WBC: 6.4 10*3/uL (ref 4.0–10.5)
nRBC: 0 % (ref 0.0–0.2)

## 2021-10-09 LAB — COMPREHENSIVE METABOLIC PANEL
ALT: 11 U/L (ref 0–44)
AST: 13 U/L — ABNORMAL LOW (ref 15–41)
Albumin: 4 g/dL (ref 3.5–5.0)
Alkaline Phosphatase: 60 U/L (ref 38–126)
Anion gap: 8 (ref 5–15)
BUN: 18 mg/dL (ref 6–20)
CO2: 27 mmol/L (ref 22–32)
Calcium: 9 mg/dL (ref 8.9–10.3)
Chloride: 104 mmol/L (ref 98–111)
Creatinine, Ser: 0.93 mg/dL (ref 0.61–1.24)
GFR, Estimated: >60 mL/min (ref >60)
Glucose, Bld: 100 mg/dL — ABNORMAL HIGH (ref 70–99)
Potassium: 4.1 mmol/L (ref 3.5–5.1)
Sodium: 139 mmol/L (ref 135–145)
Total Bilirubin: 0.6 mg/dL (ref 0.3–1.2)
Total Protein: 7.5 g/dL (ref 6.5–8.1)

## 2021-10-09 LAB — LIPASE, BLOOD: Lipase: 35 U/L (ref 11–51)

## 2021-10-09 MED ORDER — OXYCODONE-ACETAMINOPHEN 5-325 MG PO TABS: 1.0000 | ORAL_TABLET | ORAL | 0 refills | Status: DC | PRN

## 2021-10-09 MED ORDER — IOHEXOL 300 MG/ML  SOLN
100.0000 mL | Freq: Once | INTRAMUSCULAR | Status: AC | PRN
  Administered 2021-10-09: 100 mL via INTRAVENOUS

## 2021-10-09 MED ORDER — CEPHALEXIN 500 MG PO CAPS: 500.0000 mg | ORAL_CAPSULE | Freq: Three times a day (TID) | ORAL | 0 refills | Status: DC

## 2021-10-09 MED ORDER — HYDROCODONE-ACETAMINOPHEN 5-325 MG PO TABS
1.0000 | ORAL_TABLET | Freq: Once | ORAL | Status: AC
  Administered 2021-10-09: 1 via ORAL
  Filled 2021-10-09: qty 1

## 2021-10-09 NOTE — Discharge Instructions (Signed)
Please take antibiotics as prescribed for their entire course.  Please follow-up with a primary care doctor by calling the number provided to arrange a primary care appointment.  You should be receiving a call from urology, if you do not hear from them by Friday please call the number provided to arrange an appointment for next week for cystoscopy.  Return to the emergency department for any acute concerns. ?

## 2021-10-09 NOTE — ED Provider Notes (Signed)
? ?[facility] ?Provider Note ? ? ? Event Date/Time  ? First MD Initiated Contact with Patient 10/09/21 1304   ?  (approximate) ? ?History  ? ?Chief Complaint: Abdominal Pain ? ?HPI ? ?Eddie Rocha is a 56 y.o. male with a past medical history of chronic hip pain who presents to the emergency department for evaluation.  According to the patient he recently had a CT scan of his back and hip which he brought the report and it showed possible ascites within the abdomen he was referred for further work-up however he states he does not have a PCP so he came to the emergency department.  Patient states he has been experiencing some intermittent abdominal pain but denies any fever denies any vomiting diarrhea or urinary symptoms.  Patient does state chronic right hip pain. ? ?Physical Exam  ? ?Triage Vital Signs: ?ED Triage Vitals  ?Enc Vitals Group  ?   BP 10/09/21 1221 117/80  ?   Pulse Rate 10/09/21 1221 84  ?   Resp 10/09/21 1221 18  ?   Temp 10/09/21 1221 98.6 ?F (37 ?C)  ?   Temp src --   ?   SpO2 10/09/21 1221 100 %  ?   Weight 10/09/21 1219 136 lb 14.5 oz (62.1 kg)  ?   Height 10/09/21 1219 '5\' 8"'$  (1.727 m)  ?   Head Circumference --   ?   Peak Flow --   ?   Pain Score 10/09/21 1219 0  ?   Pain Loc --   ?   Pain Edu? --   ?   Excl. in [place]? --   ? ? ?Most recent vital signs: ?Vitals:  ? 10/09/21 1221 10/09/21 1330  ?BP: 117/80 118/81  ?Pulse: 84 68  ?Resp: 18 17  ?Temp: 98.6 ?F (37 ?C)   ?SpO2: 100% 97%  ? ? ?General: Awake, no distress.  ?CV:  Good peripheral perfusion.  Regular rate and rhythm  ?Resp:  Normal effort.  Equal breath sounds bilaterally.  ?Abd:  No distention.  Soft, nontender.  No rebound or guarding. ? ?ED Results / Procedures / Treatments  ? ?RADIOLOGY ? ?I personally reviewed the CT images appears to have inflammation around the bladder. ?Radiology is read the CT is a masslike enhancement along the anterior aspect of the bladder concerning for neoplasm.  Also thickening  of the bladder concerning for cystitis. ? ? ?MEDICATIONS ORDERED IN ED: ?Medications  ?HYDROcodone-acetaminophen (NORCO/VICODIN) 5-325 MG per tablet 1 tablet (1 tablet Oral Given 10/09/21 1332)  ?iohexol (OMNIPAQUE) 300 MG/ML solution 100 mL (100 mLs Intravenous Contrast Given 10/09/21 1432)  ? ? ? ?IMPRESSION / MDM / ASSESSMENT AND PLAN / ED COURSE  ?I reviewed the triage vital signs and the nursing notes. ? ?Patient presents to the emergency department for possible ascites seen on outpatient CT of his back and hips.  We will check labs and proceed with a CT scan abdomen/pelvis to further evaluate.  Patient agreeable to plan of care.  Does state intermittent abdominal pain but none currently.  Lab work shows a normal lipase.  Normal chemistry including LFTs.  Normal CBC.  Urinalysis does show red and white cells and some rare bacteria we will cover for the UTI with antibiotics. ? ?Patient CT scan has resulted concerning for possible bladder neoplasm.  We will discuss with urology for further recommendations but anticipate likely discharge home on antibiotics with urology follow-up for cystoscopy. ? ?I spoke to  Dr. Erlene Quan regarding the patient's CT findings she will arrange for cystoscopy next week.  We will refer her for a PCP.  Discharge with short course of pain medication antibiotics.  Patient agreeable to plan. ? ?FINAL CLINICAL IMPRESSION(S) / ED DIAGNOSES  ? ?Bladder mass ?Urinary infection ? ?Note:  This document was prepared using Dragon voice recognition software and may include unintentional dictation errors. ?  ?Harvest Dark, MD ?10/09/21 1545 ? ?

## 2021-10-09 NOTE — ED Triage Notes (Signed)
Pt comes into the ED via [facility] where they noted that the patient has ascites present in his stomach and needs to be sent over for further evaluation.  ?

## 2021-10-10 ENCOUNTER — Telehealth: Payer: Self-pay

## 2021-10-10 NOTE — Telephone Encounter (Signed)
Transition Care Management Unsuccessful Follow-up Telephone Call ? ?Date of discharge and from where:  10/09/2021-[facility] ? ?Attempts:  1st Attempt ? ?Reason for unsuccessful TCM follow-up call:  Left voice message ? ?  ?

## 2021-10-11 NOTE — Telephone Encounter (Signed)
Transition Care Management Unsuccessful Follow-up Telephone Call ? ?Date of discharge and from where:  10/09/2021-[facility] ? ?Attempts:  2nd Attempt ? ?Reason for unsuccessful TCM follow-up call:  Left voice message ? ?  ?

## 2021-10-14 NOTE — Telephone Encounter (Signed)
Transition Care Management Unsuccessful Follow-up Telephone Call ? ?Date of discharge and from where:  10/09/2021-[facility] ? ?Attempts:  3rd Attempt ? ?Reason for unsuccessful TCM follow-up call:  Left voice message ? ?  ?

## 2021-10-16 NOTE — Progress Notes (Incomplete)
? ?10/16/21 ?3:50 PM  ? ?Eddie Rocha ?1966-05-18 ?[id number] ? ?Referring provider:  ?No referring provider defined for this encounter. ?No chief complaint on file. ? ? ? ? ?HPI: ?Eddie Rocha is a 56 y.o.male who presents today for further evaluation of bladder mass. ? ?He had a recent CT scan of his back and hip that visualized possible ascites within the abdomen, he had not established care with a PCP for this.  ? ?He was seen in the ED on 10/09/2021 for intermittent abdominal pain. Creatinine, BUN were within normal levels. Urinalysis showedred and white cells and some rare bacteria he was treated with antibiotics. CT abdomen and pelvis visualized Sessile masslike enhancement along the anterior inferior aspect of the bladder, concerning for primary neoplasm. I was contacted.  ? ? ? ? ? ?PMH: ?Past Medical History:  ?Diagnosis Date  ? Chronic back pain   ? Hernia   ? ? ?Surgical History: ?Past Surgical History:  ?Procedure Laterality Date  ? BACK SURGERY    ? HERNIA REPAIR    ? LAMINECTOMY  07/06/2013  ? T9    T10    CORD COMPRESSION  ? LUMBAR LAMINECTOMY/DECOMPRESSION MICRODISCECTOMY N/A 07/06/2013  ? Procedure: LUMBAR LAMINECTOMY, DECOMPRESSION  T9 to T10;  Surgeon: Consuella Lose, MD;  Location: [facility];  Service: Neurosurgery;  Laterality: N/A;  ? MANDIBLE FRACTURE SURGERY    ? ? ?Home Medications:  ?Allergies as of 10/17/2021   ?No Known Allergies ?  ? ?  ?Medication List  ?  ? ?  ? Accurate as of October 16, 2021  3:50 PM. If you have any questions, ask your nurse or doctor.  ?  ?  ? ?  ? ?cephALEXin 500 MG capsule ?Commonly known as: KEFLEX ?Take 1 capsule (500 mg total) by mouth 3 (three) times daily. ?  ?gabapentin 300 MG capsule ?Commonly known as: NEURONTIN ?Take 300 mg by mouth daily. ?  ?methylPREDNISolone 4 MG Tbpk tablet ?Commonly known as: MEDROL DOSEPAK ?Take by mouth. Take as directed per package instructions. ?  ?oxyCODONE-acetaminophen 5-325 MG tablet ?Commonly known as:  Percocet ?Take 1 tablet by mouth every 4 (four) hours as needed for severe pain. ?  ?traMADol 50 MG tablet ?Commonly known as: Ultram ?Take 1 tablet (50 mg total) by mouth every 6 (six) hours as needed. ?  ? ?  ? ? ?Allergies:  ?No Known Allergies ? ?Family History: ?Family History  ?Problem Relation Age of Onset  ? Cancer Brother   ?     throat  ? ? ?Social History:  reports that he has been smoking cigarettes. He has a 28.00 pack-year smoking history. He has never used smokeless tobacco. He reports that he does not currently use alcohol. He reports that he does not use drugs. ? ? ?Physical Exam: ?There were no vitals taken for this visit.  ?Constitutional:  Alert and oriented, No acute distress. ?HEENT: [place] AT, moist mucus membranes.  Trachea midline, no masses. ?Cardiovascular: No clubbing, cyanosis, or edema. ?Respiratory: Normal respiratory effort, no increased work of breathing. ?Skin: No rashes, bruises or suspicious lesions. ?Neurologic: Grossly intact, no focal deficits, moving all 4 extremities. ?Psychiatric: Normal mood and affect. ? ?Laboratory Data: ? ?Lab Results  ?Component Value Date  ? CREATININE 0.93 10/09/2021  ? ?No results found for: HGBA1C ? ?Urinalysis ? ? ?Pertinent Imaging: ? ? ? ?Assessment & Plan:   ? ? ?No follow-ups on file. ? ?I,Kailey Littlejohn,acting as a scribe for Hollice Espy, MD.,have  documented all relevant documentation on the behalf of Hollice Espy, MD,as directed by  Hollice Espy, MD while in the presence of Hollice Espy, MD. ? ? ?[place] ?[address], [address] ?[place],  [id number] ?[phone] ? ?

## 2021-10-17 ENCOUNTER — Ambulatory Visit: Payer: Self-pay | Admitting: Urology

## 2021-10-17 ENCOUNTER — Encounter: Payer: Self-pay | Admitting: Urology

## 2021-10-23 ENCOUNTER — Emergency Department (HOSPITAL_COMMUNITY)
Admission: EM | Admit: 2021-10-23 | Discharge: 2021-10-23 | Disposition: A | Discharge status: Home / Self Care | Payer: Medicaid Other | Attending: Emergency Medicine | Admitting: Emergency Medicine

## 2021-10-23 ENCOUNTER — Other Ambulatory Visit: Payer: Self-pay

## 2021-10-23 DIAGNOSIS — R309 Painful micturition, unspecified: Secondary | ICD-10-CM | POA: Insufficient documentation

## 2021-10-23 DIAGNOSIS — Z23 Encounter for immunization: Secondary | ICD-10-CM | POA: Insufficient documentation

## 2021-10-23 DIAGNOSIS — S99929A Unspecified injury of unspecified foot, initial encounter: Secondary | ICD-10-CM

## 2021-10-23 DIAGNOSIS — S90852A Superficial foreign body, left foot, initial encounter: Secondary | ICD-10-CM | POA: Insufficient documentation

## 2021-10-23 DIAGNOSIS — W458XXA Other foreign body or object entering through skin, initial encounter: Secondary | ICD-10-CM | POA: Insufficient documentation

## 2021-10-23 DIAGNOSIS — S99921A Unspecified injury of right foot, initial encounter: Secondary | ICD-10-CM | POA: Diagnosis not present

## 2021-10-23 DIAGNOSIS — Y92009 Unspecified place in unspecified non-institutional (private) residence as the place of occurrence of the external cause: Secondary | ICD-10-CM | POA: Insufficient documentation

## 2021-10-23 DIAGNOSIS — R300 Dysuria: Secondary | ICD-10-CM

## 2021-10-23 LAB — URINALYSIS, ROUTINE W REFLEX MICROSCOPIC
Bilirubin Urine: NEGATIVE
Glucose, UA: NEGATIVE mg/dL
Ketones, ur: NEGATIVE mg/dL
Nitrite: NEGATIVE
Protein, ur: 30 mg/dL — AB
Specific Gravity, Urine: 1.018 (ref 1.005–1.030)
pH: 5 (ref 5.0–8.0)

## 2021-10-23 MED ORDER — CEPHALEXIN 500 MG PO CAPS: 500.0000 mg | ORAL_CAPSULE | Freq: Two times a day (BID) | ORAL | 0 refills | Status: AC

## 2021-10-23 MED ORDER — LIDOCAINE HCL (PF) 1 % IJ SOLN
5.0000 mL | Freq: Once | INTRAMUSCULAR | Status: DC
  Filled 2021-10-23: qty 5

## 2021-10-23 MED ORDER — TETANUS-DIPHTH-ACELL PERTUSSIS 5-2.5-18.5 LF-MCG/0.5 IM SUSY
0.5000 mL | PREFILLED_SYRINGE | Freq: Once | INTRAMUSCULAR | Status: AC
  Administered 2021-10-23: 0.5 mL via INTRAMUSCULAR
  Filled 2021-10-23: qty 0.5

## 2021-10-23 NOTE — Discharge Instructions (Signed)
Please take the antibiotic as directed until finished to help prevent infection from the fishhook.  Keep the area clean and dry.  Watch for signs of infection including worsening redness, swelling, discharge, increasing pain, fevers or chills.  We have sent your urine for culture today, however I suspect that your burning with urination is due to the possible bladder tumor that you have.  If your culture comes back negative we will treat you for a UTI.  Please make sure to keep your urology appointment.  Return to the ER for any new or worsening symptoms. ?

## 2021-10-23 NOTE — ED Notes (Signed)
Pt noted not to be in his hall bed when DC papers became available. He had been missing for a few minutes.  This nurse went out to lobby and outside and DC papers left with Sort in the event he returns for them. ?

## 2021-10-23 NOTE — ED Provider Notes (Signed)
?[facility] ?Provider Note ? ? ?CSN: [id number] ?Arrival date & time: 10/23/21  1723 ? ?  ? ?History ? ?No chief complaint on file. ? ? ?Eddie Rocha is a 56 y.o. male. ? ?HPI ?56 year old male with a history of thoracic spinal injury, hernia, chronic back pain presented to the ER after stepping on a fishhook.  EMS tried to remove it but she got pale and diaphoretic and was brought to the emergency department for pain management.  Patient states that this was a clean hook that was in the carpet of his home.  Unclear when last tetanus was.  He also complains of burning with urination, which has been ongoing for quite some time.  He was seen at [facility] at the beginning of the month, was treated with Keflex and referred to urology for possible bladder tumor.  States he is not sexually active.  He has an appointment with them on May 13.  He continues to have burning with urination.  No fevers or chills. ?  ? ?Home Medications ?Prior to Admission medications   ?Medication Sig Start Date End Date Taking? Authorizing Provider  ?cephALEXin (KEFLEX) 500 MG capsule Take 1 capsule (500 mg total) by mouth 2 (two) times daily for 7 days. 10/23/21 10/30/21 Yes Garald Balding, PA-C  ?gabapentin (NEURONTIN) 300 MG capsule Take 300 mg by mouth daily.    [provider]  ?methylPREDNISolone (MEDROL DOSEPAK) 4 MG TBPK tablet Take by mouth. Take as directed per package instructions.    [provider]  ?oxyCODONE-acetaminophen (PERCOCET) 5-325 MG tablet Take 1 tablet by mouth every 4 (four) hours as needed for severe pain. 10/09/21   Harvest Dark, MD  ?traMADol (ULTRAM) 50 MG tablet Take 1 tablet (50 mg total) by mouth every 6 (six) hours as needed. 08/06/21 08/06/22  Lavonia Drafts, MD  ?   ? ?Allergies    ?Patient has no known allergies.   ? ?Review of Systems   ?Review of Systems ?Ten systems reviewed and are negative for acute change, except as noted in the  HPI.  ? ?Physical Exam ?Updated Vital Signs ?BP 106/71   Pulse 71   Temp 98.8 ?F (37.1 ?C) (Oral)   Resp 16   SpO2 98%  ?Physical Exam ?Vitals and nursing note reviewed.  ?Constitutional:   ?   General: He is not in acute distress. ?   Appearance: He is well-developed.  ?HENT:  ?   Head: Normocephalic and atraumatic.  ?Eyes:  ?   Conjunctiva/sclera: Conjunctivae normal.  ?Cardiovascular:  ?   Rate and Rhythm: Normal rate and regular rhythm.  ?   Heart sounds: No murmur heard. ?Pulmonary:  ?   Effort: Pulmonary effort is normal. No respiratory distress.  ?   Breath sounds: Normal breath sounds.  ?Abdominal:  ?   Palpations: Abdomen is soft.  ?   Tenderness: There is no abdominal tenderness.  ?   Comments: Abdomen is soft, nontender  ?Musculoskeletal:     ?   General: No swelling.  ?   Cervical back: Neck supple.  ?   Comments: Visible fishhook on the left bottom of the foot.  Sensations intact.  No bleeding.  ?Skin: ?   General: Skin is warm and dry.  ?   Capillary Refill: Capillary refill takes less than 2 seconds.  ?Neurological:  ?   Mental Status: He is alert.  ?Psychiatric:     ?   Mood and Affect:  Mood normal.  ? ? ?ED Results / Procedures / Treatments   ?Labs ?(all labs ordered are listed, but only abnormal results are displayed) ?Labs Reviewed  ?URINALYSIS, ROUTINE W REFLEX MICROSCOPIC - Abnormal; Notable for the following components:  ?    Result Value  ? Hgb urine dipstick MODERATE (*)   ? Protein, ur 30 (*)   ? Leukocytes,Ua TRACE (*)   ? Bacteria, UA RARE (*)   ? Non Squamous Epithelial 0-5 (*)   ? All other components within normal limits  ?GC/CHLAMYDIA PROBE AMP ([place]) NOT AT [facility]  ? ? ?EKG ?None ? ?Radiology ?No results found. ? ?Procedures ?Marland KitchenForeign Body Removal ? ?Date/Time: 10/23/2021 7:37 PM ?Performed by: Garald Balding, PA-C ?Authorized by: Garald Balding, PA-C  ?Consent: Verbal consent not obtained. ?Consent given by: patient ?Patient identity confirmed: verbally with patient ?Body  area: skin (left plantar aspect of left foot) ?1 objects recovered. ?Objects recovered: fish hook ?Post-procedure assessment: foreign body removed ?Patient tolerance: patient tolerated the procedure well with no immediate complications ?  ? ? ?Medications Ordered in ED ?Medications  ?lidocaine (PF) (XYLOCAINE) 1 % injection 5 mL (has no administration in time range)  ?Tdap (BOOSTRIX) injection 0.5 mL (0.5 mLs Intramuscular Given 10/23/21 1819)  ? ? ?ED Course/ Medical Decision Making/ A&P ?  ?                        ?Medical Decision Making ?Amount and/or Complexity of Data Reviewed ?Labs: ordered. ? ?Risk ?Prescription drug management. ? ?Patient presenting with fish hook in his left foot.  Fishhook was removed successfully.  Tetanus updated.  Patient continues to experience burning with urination, his UA shows trace leukocytes, rare bacteria, 0-5 non-squamous epithelials.  We will hold treatment, sent for culture.  He has follow-up with urology on May 13. Keflex prescribed for prevention of soft tissue infection. We discussed wound care and return precautions. He voiced understanding and is agreeable. Stable for discharge  ? ?Case discussed w/ Dr. Ronnald Nian who is agreeable to the above plan and disposition  ?Final Clinical Impression(s) / ED Diagnoses ?Final diagnoses:  ?Fish hook in foot  ? ? ?Rx / DC Orders ?ED Discharge Orders   ? ?      Ordered  ?  cephALEXin (KEFLEX) 500 MG capsule  2 times daily       ? 10/23/21 1902  ? ?  ?  ? ?  ? ? ?  ?Garald Balding, PA-C ?10/23/21 1945 ? ?  ?Lennice Sites, DO ?10/24/21 0015 ? ?

## 2021-10-23 NOTE — ED Triage Notes (Signed)
Pt BIB [facility] for a fishhook lodged in his left foot.  Medic on scene attempted to remove it but pt became pale and diaphoretic so they chose to transport for pain magmt while removing.  PT also asked about refill on abx for a previously diagnosed bladder infection.  He states he finished his prev scrip but still has symptoms.  ?

## 2021-10-24 ENCOUNTER — Telehealth: Payer: Self-pay

## 2021-10-24 LAB — GC/CHLAMYDIA PROBE AMP (~~LOC~~) NOT AT ARMC
Chlamydia: NEGATIVE
Comment: NEGATIVE
Comment: NORMAL
Neisseria Gonorrhea: NEGATIVE

## 2021-10-24 NOTE — Telephone Encounter (Signed)
Transition Care Management Unsuccessful Follow-up Telephone Call ? ?Date of discharge and from where:  10/23/2021 from [facility] ? ?Attempts:  1st Attempt ? ?Reason for unsuccessful TCM follow-up call:  Left voice message ? ? ? ?

## 2021-10-25 LAB — URINE CULTURE: Culture: <10000 — AB

## 2021-10-28 NOTE — Telephone Encounter (Signed)
Transition Care Management Unsuccessful Follow-up Telephone Call ? ?Date of discharge and from where:  10/23/2021-[facility]  ? ?Attempts:  2nd Attempt ? ?Reason for unsuccessful TCM follow-up call:  Left voice message ? ?  ?

## 2021-10-29 NOTE — Telephone Encounter (Signed)
Transition Care Management Unsuccessful Follow-up Telephone Call ? ?Date of discharge and from where:  10/23/2021-[facility]  ? ?Attempts:  3rd Attempt ? ?Reason for unsuccessful TCM follow-up call:  Unable to reach patient ? ? ? ?

## 2021-11-12 ENCOUNTER — Encounter: Payer: Self-pay | Admitting: Emergency Medicine

## 2021-11-12 ENCOUNTER — Other Ambulatory Visit: Payer: Self-pay

## 2021-11-12 ENCOUNTER — Emergency Department
Admission: EM | Admit: 2021-11-12 | Discharge: 2021-11-12 | Disposition: A | Discharge status: Home / Self Care | Payer: Medicaid Other | Attending: Emergency Medicine | Admitting: Emergency Medicine

## 2021-11-12 DIAGNOSIS — N3091 Cystitis, unspecified with hematuria: Secondary | ICD-10-CM | POA: Insufficient documentation

## 2021-11-12 DIAGNOSIS — N3090 Cystitis, unspecified without hematuria: Secondary | ICD-10-CM | POA: Diagnosis not present

## 2021-11-12 DIAGNOSIS — R319 Hematuria, unspecified: Secondary | ICD-10-CM | POA: Diagnosis present

## 2021-11-12 LAB — CBC WITH DIFFERENTIAL/PLATELET
Abs Immature Granulocytes: 0.03 10*3/uL (ref 0.00–0.07)
Basophils Absolute: 0 10*3/uL (ref 0.0–0.1)
Basophils Relative: 0 %
Eosinophils Absolute: 0.3 10*3/uL (ref 0.0–0.5)
Eosinophils Relative: 3 %
HCT: 45.9 % (ref 39.0–52.0)
Hemoglobin: 15.4 g/dL (ref 13.0–17.0)
Immature Granulocytes: 0 %
Lymphocytes Relative: 21 %
Lymphs Abs: 1.7 10*3/uL (ref 0.7–4.0)
MCH: 32.1 pg (ref 26.0–34.0)
MCHC: 33.6 g/dL (ref 30.0–36.0)
MCV: 95.6 fL (ref 80.0–100.0)
Monocytes Absolute: 0.7 10*3/uL (ref 0.1–1.0)
Monocytes Relative: 8 %
Neutro Abs: 5.4 10*3/uL (ref 1.7–7.7)
Neutrophils Relative %: 68 %
Platelets: 404 10*3/uL — ABNORMAL HIGH (ref 150–400)
RBC: 4.8 MIL/uL (ref 4.22–5.81)
RDW: 14.5 % (ref 11.5–15.5)
WBC: 8.1 10*3/uL (ref 4.0–10.5)
nRBC: 0 % (ref 0.0–0.2)

## 2021-11-12 LAB — URINALYSIS, ROUTINE W REFLEX MICROSCOPIC
Bilirubin Urine: NEGATIVE
Glucose, UA: NEGATIVE mg/dL
Ketones, ur: 20 mg/dL — AB
Nitrite: NEGATIVE
Protein, ur: 100 mg/dL — AB
Specific Gravity, Urine: 1.023 (ref 1.005–1.030)
WBC, UA: >50 WBC/hpf — ABNORMAL HIGH (ref 0–5)
pH: 5 (ref 5.0–8.0)

## 2021-11-12 LAB — BASIC METABOLIC PANEL
Anion gap: 11 (ref 5–15)
BUN: 34 mg/dL — ABNORMAL HIGH (ref 6–20)
CO2: 23 mmol/L (ref 22–32)
Calcium: 9.5 mg/dL (ref 8.9–10.3)
Chloride: 103 mmol/L (ref 98–111)
Creatinine, Ser: 1.25 mg/dL — ABNORMAL HIGH (ref 0.61–1.24)
GFR, Estimated: >60 mL/min (ref >60)
Glucose, Bld: 95 mg/dL (ref 70–99)
Potassium: 4.1 mmol/L (ref 3.5–5.1)
Sodium: 137 mmol/L (ref 135–145)

## 2021-11-12 MED ORDER — TRAMADOL HCL 50 MG PO TABS: 50.0000 mg | ORAL_TABLET | Freq: Four times a day (QID) | ORAL | 0 refills | Status: DC | PRN

## 2021-11-12 MED ORDER — SULFAMETHOXAZOLE-TRIMETHOPRIM 800-160 MG PO TABS: 1.0000 | ORAL_TABLET | Freq: Two times a day (BID) | ORAL | 0 refills | Status: DC

## 2021-11-12 MED ORDER — NAPROXEN 500 MG PO TABS: 500.0000 mg | ORAL_TABLET | Freq: Two times a day (BID) | ORAL | 0 refills | Status: DC

## 2021-11-12 NOTE — ED Provider Notes (Signed)
? ?[facility] ?Provider Note ? ? ? Event Date/Time  ? First MD Initiated Contact with Patient 11/12/21 1633   ?  (approximate) ? ? ?History  ? ?Hematuria ? ? ?HPI ? ?Eddie Rocha is a 56 y.o. 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  He reports that he does have a follow-up appointment with urology on May 13. ? ?Denies chest pain or shortness of breath.  No lower extremity weakness or paresthesia or saddle anesthesia.  Denies incontinence or retention. ? ?Also complains of worsening of chronic right hip pain.  I reviewed PDMP, he has been prescribed Ultram and hydrocodone for this over the last few months. ?  ? ? ?Physical Exam  ? ?Triage Vital Signs: ?ED Triage Vitals  ?Enc Vitals Group  ?   BP 11/12/21 1553 136/84  ?   Pulse Rate 11/12/21 1553 100  ?   Resp 11/12/21 1553 17  ?   Temp 11/12/21 1553 98.8 ?F (37.1 ?C)  ?   Temp Source 11/12/21 1553 Oral  ?   SpO2 11/12/21 1553 96 %  ?   Weight --   ?   Height 11/12/21 1554 '5\' 8"'$  (1.727 m)  ?   Head Circumference --   ?   Peak Flow --   ?   Pain Score 11/12/21 1554 6  ?   Pain Loc --   ?   Pain Edu? --   ?   Excl. in [place]? --   ? ? ?Most recent vital signs: ?Vitals:  ? 11/12/21 1553 11/12/21 1813  ?BP: 136/84 132/80  ?Pulse: 100 94  ?Resp: 17 17  ?Temp: 98.8 ?F (37.1 ?C) 98.8 ?F (37.1 ?C)  ?SpO2: 96% 99%  ? ? ? ?General: Awake, no distress.  ?CV:  Good peripheral perfusion.  ?Resp:  Normal effort.  ?Abd:  No distention.  Soft with suprapubic tenderness.  No CVA tenderness ?Other:  Full range of motion in all extremities.  No focal musculoskeletal tenderness. ? ? ?ED Results / Procedures / Treatments  ? ?Labs ?(all labs ordered are  listed, but only abnormal results are displayed) ?Labs Reviewed  ?BASIC METABOLIC PANEL - Abnormal; Notable for the following components:  ?    Result Value  ? BUN 34 (*)   ? Creatinine, Ser 1.25 (*)   ? All other components within normal limits  ?CBC WITH DIFFERENTIAL/PLATELET - Abnormal; Notable for the following components:  ? Platelets 404 (*)   ? All other components within normal limits  ?URINALYSIS, ROUTINE W REFLEX MICROSCOPIC - Abnormal; Notable for the following components:  ? Color, Urine YELLOW (*)   ? APPearance HAZY (*)   ? Hgb urine dipstick LARGE (*)   ? Ketones, ur 20 (*)   ? Protein, ur 100 (*)   ? Leukocytes,Ua SMALL (*)   ? WBC, UA >50 (*)   ? Bacteria, UA RARE (*)   ? Non Squamous Epithelial PRESENT (*)   ? All other components within normal limits  ?URINE CULTURE  ? ? ? ?EKG ? ? ? ? ?RADIOLOGY ? ? ? ? ?PROCEDURES: ? ?Critical Care performed: No ? ?Procedures ? ? ?MEDICATIONS ORDERED IN ED: ?Medications -  No data to display ? ? ?IMPRESSION / MDM / ASSESSMENT AND PLAN / ED COURSE  ?I reviewed the triage vital signs and the nursing notes. ?             ?               ? ?Differential diagnosis includes, but is not limited to, cystitis, urinary retention.  Ureterolithiasis, renal failure, appendicitis, bowel obstruction, biliary disease, pancreatitis, AAA, dissection, septic arthritis, arterial embolism ? ?Patient presents with urinary symptoms consistent with cystitis.  Urinalysis does show inflammatory changes in the urine, worse than previous, consistent with acute cystitis.  Reinforced to patient the importance of following up with urology.  I will put him on Bactrim for now for UTI.  We will also perform a bladder scan to see if he needs a Foley catheter. ? ?We will also treat with a limited course of tramadol and naproxen for his acute on chronic pain.  He is not septic. ? ? ?Clinical Course as of 11/12/21 1820  ?Tue Nov 12, 2021  ?1755 Bladder volume 102m. No foley needed [PS]  ?  ?Clinical  Course User Index ?[PS] SCarrie Mew MD  ? ? ? ?FINAL CLINICAL IMPRESSION(S) / ED DIAGNOSES  ? ?Final diagnoses:  ?Hemorrhagic cystitis  ? ? ? ?Rx / DC Orders  ? ?ED Discharge Orders   ? ?      Ordered  ?  naproxen (NAPROSYN) 500 MG tablet  2 times daily with meals,   Status:  Discontinued       ? 11/12/21 1649  ?  traMADol (ULTRAM) 50 MG tablet  Every 6 hours PRN,   Status:  Discontinued       ? 11/12/21 1649  ?  sulfamethoxazole-trimethoprim (BACTRIM DS) 800-160 MG tablet  2 times daily,   Status:  Discontinued       ? 11/12/21 1649  ?  naproxen (NAPROSYN) 500 MG tablet  2 times daily with meals       ? 11/12/21 1820  ?  sulfamethoxazole-trimethoprim (BACTRIM DS) 800-160 MG tablet  2 times daily       ? 11/12/21 1820  ?  traMADol (ULTRAM) 50 MG tablet  Every 6 hours PRN       ? 11/12/21 1820  ? ?  ?  ? ?  ? ? ? ?Note:  This document was prepared using Dragon voice recognition software and may include unintentional dictation errors. ?  ?SCarrie Mew MD ?11/12/21 1820 ? ?

## 2021-11-12 NOTE — ED Notes (Signed)
Patient left before given prescriptions. Called cell number to let patient know. No answer.  ?

## 2021-11-12 NOTE — ED Notes (Signed)
Called patient to discuss leaving without prescriptions. Pt requesting meds [facility]. MD made aware. ?

## 2021-11-12 NOTE — ED Provider Triage Note (Signed)
Emergency Medicine Provider Triage Evaluation Note ? ?Eddie Rocha, a 56 y.o. male  was evaluated in triage.  Pt complains of low back pain and right flank pain as well as hematuria.  Patient reports he notices blood from the penis after he completes his urination.  He reports it occurred previously 2 weeks prior, he presented to the ED for evaluation. He has a bladder mass found on CT, that is being evaluated next week by urology.  ? ?Review of Systems  ?Positive: hematuria ?Negative: FCS ? ?Physical Exam  ?BP 136/84   Pulse 100   Temp 98.8 ?F (37.1 ?C) (Oral)   Resp 17   SpO2 96%  ?Gen:   Awake, no distress   ?Resp:  Normal effort CTA ?MSK:   Moves extremities without difficulty  ?ABD:  Soft, nontender ? ?Medical Decision Making  ?Medically screening exam initiated at 3:54 PM.  Appropriate orders placed.  Eddie Rocha was informed that the remainder of the evaluation will be completed by another provider, this initial triage assessment does not replace that evaluation, and the importance of remaining in the ED until their evaluation is complete. ? ?Patient with history of AVN and bladder mass, presenting to the ED for recurrent hematuria.  Recently completed a course of antibiotics. ?  ?Melvenia Needles, PA-C ?11/12/21 1601 ? ?

## 2021-11-12 NOTE — ED Triage Notes (Signed)
Pt presents via pov with c/o lower back pain and hip pain. Pt reports he has also noted blood in urine. Pt mainly notices blood after he finishes peeing. Pt reports seen here previously about 2 weeks ago for the same ?

## 2021-11-13 ENCOUNTER — Telehealth: Payer: Self-pay

## 2021-11-13 DIAGNOSIS — M5416 Radiculopathy, lumbar region: Secondary | ICD-10-CM | POA: Diagnosis not present

## 2021-11-13 DIAGNOSIS — Z79899 Other long term (current) drug therapy: Secondary | ICD-10-CM | POA: Diagnosis not present

## 2021-11-13 DIAGNOSIS — Z79891 Long term (current) use of opiate analgesic: Secondary | ICD-10-CM | POA: Diagnosis not present

## 2021-11-13 DIAGNOSIS — Z789 Other specified health status: Secondary | ICD-10-CM

## 2021-11-13 NOTE — Telephone Encounter (Signed)
Transition Care Management Unsuccessful Follow-up Telephone Call ? ?Date of discharge and from where:  11/12/2021 from [facility] ? ?Attempts:  1st Attempt ? ?Reason for unsuccessful TCM follow-up call:  Left voice message ? ? ? ?

## 2021-11-14 ENCOUNTER — Telehealth: Payer: Self-pay

## 2021-11-14 LAB — URINE CULTURE

## 2021-11-14 NOTE — Telephone Encounter (Signed)
Transition Care Management Unsuccessful Follow-up Telephone Call ? ?Date of discharge and from where:  11/12/2021 from [facility] ? ?Attempts:  2nd Attempt ? ?Reason for unsuccessful TCM follow-up call:  Left voice message ? ? ? ?

## 2021-11-14 NOTE — Patient Instructions (Signed)
Visit Information ? ?Mr. Eddie Rocha  - as a part of your Medicaid benefit, you are eligible for care management and care coordination services at no cost or copay. I was unable to reach you by phone today but would be happy to help you with your health related needs. Please feel free to call me @ [phone] ? ?A member of the Managed Medicaid care management team will reach out to you again over the next 7 days.  ? ?Mickel Fuchs, BSW, MHA ?[place]  ?High Risk Managed Medicaid Team  ?[phone]  ?

## 2021-11-14 NOTE — Patient Outreach (Signed)
Care Coordination ? ?11/14/2021 ? ?[name] ?Feb 08, 1966 ?[id number] ? ? ?Medicaid Managed Care  ? ?Unsuccessful Outreach Note ? ?11/14/2021 ?Name: Eddie Rocha MRN: [id number] DOB: Mar 03, 1966 ? ?Referred by: Pcp, No ?Reason for referral : High Risk Managed Medicaid (MM Social work Unsuccessful Theatre manager) ? ? ?An unsuccessful telephone outreach was attempted today. The patient was referred to the case management team for assistance with care management and care coordination.  ? ?Follow Up Plan: The care management team will reach out to the patient again over the next 7 days.  ? ?Mickel Fuchs, BSW, MHA ?[place]  ?High Risk Managed Medicaid Team  ?[phone]  ?

## 2021-11-15 NOTE — Telephone Encounter (Signed)
Transition Care Management Follow-up Telephone Call ?Date of discharge and from where: 11/12/2021 from [facility] ?How have you been since you were released from the hospital? Patient stated that he wasn't sure how he is doing patient mentioned that he is in need of a doctor (PCP and urology). Patient was not able to continue the conversation.  ?Any questions or concerns? No ? ? ?Follow up appointments reviewed: ? ?PCP Hospital f/u appt confirmed? No   ?Specialist Hospital f/u appt confirmed? No  Patient given number to [facility].  ?Are transportation arrangements needed? Yes  ?If their condition worsens, is the pt aware to call PCP or go to the Emergency Dept.? Yes ?Was the patient provided with contact information for the PCP's office or ED? Yes ?Was to pt encouraged to call back with questions or concerns? Yes ? ?

## 2021-11-18 ENCOUNTER — Telehealth: Payer: Self-pay

## 2021-11-18 NOTE — Telephone Encounter (Signed)
? ?  Telephone encounter was:  Successful.  ?11/18/2021 ?Name: CALDEN DORSEY MRN: [id number] DOB: 09-06-1965 ? ?BLADYN TIPPS is a 56 y.o. year old male who is a primary care patient of Pcp, No . The community resource team was consulted for assistance with  insurance ? ?Care guide performed the following interventions: Patient provided with information about care guide support team and interviewed to confirm resource needs.Patient needs a new medicaid card. I gave patient the number to call to confirm insurance with medicaid as well as get a new card and find pcp. He will call back if he finds he does not have medicaid so we can move forward finding insurance for him ? ?Follow Up Plan:  Care guide will follow up with patient by phone over the next day ? ? ? ?Larena Sox ?Care Guide, Embedded Care Coordination ?[place], Care Management  ?[phone] ?[address], [place], [place] [address] ?Phone: [phone] ?Email: [email or web address] ? ?  ?

## 2021-11-25 ENCOUNTER — Emergency Department
Admission: EM | Admit: 2021-11-25 | Discharge: 2021-11-25 | Disposition: A | Discharge status: Home / Self Care | Payer: Medicaid Other | Attending: Emergency Medicine | Admitting: Emergency Medicine

## 2021-11-25 ENCOUNTER — Other Ambulatory Visit: Payer: Self-pay

## 2021-11-25 DIAGNOSIS — M87851 Other osteonecrosis, right femur: Secondary | ICD-10-CM | POA: Insufficient documentation

## 2021-11-25 DIAGNOSIS — N3289 Other specified disorders of bladder: Secondary | ICD-10-CM | POA: Diagnosis not present

## 2021-11-25 DIAGNOSIS — R310 Gross hematuria: Secondary | ICD-10-CM | POA: Diagnosis not present

## 2021-11-25 LAB — COMPREHENSIVE METABOLIC PANEL
ALT: 11 U/L (ref 0–44)
AST: 15 U/L (ref 15–41)
Albumin: 3.8 g/dL (ref 3.5–5.0)
Alkaline Phosphatase: 68 U/L (ref 38–126)
Anion gap: 7 (ref 5–15)
BUN: 17 mg/dL (ref 6–20)
CO2: 26 mmol/L (ref 22–32)
Calcium: 9.1 mg/dL (ref 8.9–10.3)
Chloride: 106 mmol/L (ref 98–111)
Creatinine, Ser: 1.06 mg/dL (ref 0.61–1.24)
GFR, Estimated: >60 mL/min (ref >60)
Glucose, Bld: 108 mg/dL — ABNORMAL HIGH (ref 70–99)
Potassium: 3.8 mmol/L (ref 3.5–5.1)
Sodium: 139 mmol/L (ref 135–145)
Total Bilirubin: 0.6 mg/dL (ref 0.3–1.2)
Total Protein: 6.9 g/dL (ref 6.5–8.1)

## 2021-11-25 LAB — CBC WITH DIFFERENTIAL/PLATELET
Abs Immature Granulocytes: 0.01 10*3/uL (ref 0.00–0.07)
Basophils Absolute: 0 10*3/uL (ref 0.0–0.1)
Basophils Relative: 1 %
Eosinophils Absolute: 0.6 10*3/uL — ABNORMAL HIGH (ref 0.0–0.5)
Eosinophils Relative: 10 %
HCT: 43.9 % (ref 39.0–52.0)
Hemoglobin: 14.3 g/dL (ref 13.0–17.0)
Immature Granulocytes: 0 %
Lymphocytes Relative: 30 %
Lymphs Abs: 1.8 10*3/uL (ref 0.7–4.0)
MCH: 32 pg (ref 26.0–34.0)
MCHC: 32.6 g/dL (ref 30.0–36.0)
MCV: 98.2 fL (ref 80.0–100.0)
Monocytes Absolute: 0.5 10*3/uL (ref 0.1–1.0)
Monocytes Relative: 8 %
Neutro Abs: 3.1 10*3/uL (ref 1.7–7.7)
Neutrophils Relative %: 51 %
Platelets: 309 10*3/uL (ref 150–400)
RBC: 4.47 MIL/uL (ref 4.22–5.81)
RDW: 15.3 % (ref 11.5–15.5)
WBC: 6.1 10*3/uL (ref 4.0–10.5)
nRBC: 0 % (ref 0.0–0.2)

## 2021-11-25 LAB — URINALYSIS, ROUTINE W REFLEX MICROSCOPIC
Bilirubin Urine: NEGATIVE
Glucose, UA: NEGATIVE mg/dL
Ketones, ur: NEGATIVE mg/dL
Nitrite: NEGATIVE
Protein, ur: 100 mg/dL — AB
RBC / HPF: >50 RBC/hpf — ABNORMAL HIGH (ref 0–5)
Specific Gravity, Urine: 1.024 (ref 1.005–1.030)
WBC, UA: >50 WBC/hpf — ABNORMAL HIGH (ref 0–5)
pH: 5 (ref 5.0–8.0)

## 2021-11-25 MED ORDER — OXYCODONE-ACETAMINOPHEN 5-325 MG PO TABS
1.0000 | ORAL_TABLET | Freq: Once | ORAL | Status: AC
  Administered 2021-11-25: 1 via ORAL
  Filled 2021-11-25: qty 1

## 2021-11-25 NOTE — ED Notes (Signed)
Pt reports that he has been having rt hip pain for months, knows that he needs hip surgery. Pt also reports painful and bloody urination x 2 months. Pt has been seen for same he states that he has been told he possibly has cancer.

## 2021-11-25 NOTE — Discharge Instructions (Addendum)
You have a mass in your bladder that is concerning for cancer.  You will need to follow-up with a specialist so that they can perform further tests including a biopsy.  A follow-up appointment has been scheduled for you this Thursday at 10:30 AM in the medical arts building here at [facility].  It is very important you keep this appointment as further delays in your care could make this life-threatening.

## 2021-11-25 NOTE — ED Provider Notes (Signed)
[facility] Provider Note    Event Date/Time   First MD Initiated Contact with Patient 11/25/21 1203     (approximate)   History   Chief Complaint Hip Pain and Hematuria   HPI  Eddie Rocha is a 56 y.o. male with past medical history of chronic back pain and avascular necrosis of right hip who presents to the ED complaining of hematuria.  Patient reports that he has been dealing with 3 to 4 months of constant gross hematuria associated with burning when he pees.  He states he has been seen in the ED for this on 4 separate occasions, prescribed antibiotics each time with no improvement in his symptoms.  He reports some pain in his suprapubic area but denies any fevers or flank pain.  He reports being told there is a "mass in my bladder" but he has been unable to schedule follow-up with urology.  He reports being compliant with antibiotic regimen and completed course of Bactrim 1 to 2 weeks ago.  He additionally complains of chronic pain in his right hip, states he has been told he needs a hip replacement in the past but they cannot do so until his bladder is taken care of.  He denies any recent falls or other new trauma to his hip.     Physical Exam   Triage Vital Signs: ED Triage Vitals  Enc Vitals Group     BP 11/25/21 1144 (!) 147/70     Pulse Rate 11/25/21 1144 81     Resp 11/25/21 1144 18     Temp 11/25/21 1144 98.1 F (36.7 C)     Temp src --      SpO2 11/25/21 1144 96 %     Weight 11/25/21 1144 135 lb (61.2 kg)     Height 11/25/21 1144 '5\' 8"'$  (1.727 m)     Head Circumference --      Peak Flow --      Pain Score 11/25/21 1144 9     Pain Loc --      Pain Edu? --      Excl. in [place]? --     Most recent vital signs: Vitals:   11/25/21 1144  BP: (!) 147/70  Pulse: 81  Resp: 18  Temp: 98.1 F (36.7 C)  SpO2: 96%    Constitutional: Alert and oriented. Eyes: Conjunctivae are normal. Head: Atraumatic. Nose: No  congestion/rhinnorhea. Mouth/Throat: Mucous membranes are moist.  Cardiovascular: Normal rate, regular rhythm. Grossly normal heart sounds.  2+ radial pulses bilaterally. Respiratory: Normal respiratory effort.  No retractions. Lungs CTAB. Gastrointestinal: Soft and nontender.  No CVA tenderness bilaterally.  No distention. Musculoskeletal: No lower extremity tenderness nor edema.  Range of motion intact to right hip, able to ambulate without pain. Neurologic:  Normal speech and language. No gross focal neurologic deficits are appreciated.    ED Results / Procedures / Treatments   Labs (all labs ordered are listed, but only abnormal results are displayed) Labs Reviewed  CBC WITH DIFFERENTIAL/PLATELET - Abnormal; Notable for the following components:      Result Value   Eosinophils Absolute 0.6 (*)    All other components within normal limits  COMPREHENSIVE METABOLIC PANEL - Abnormal; Notable for the following components:   Glucose, Bld 108 (*)    All other components within normal limits  URINALYSIS, ROUTINE W REFLEX MICROSCOPIC - Abnormal; Notable for the following components:   Color, Urine YELLOW (*)    APPearance HAZY (*)  Hgb urine dipstick LARGE (*)    Protein, ur 100 (*)    Leukocytes,Ua SMALL (*)    RBC / HPF >50 (*)    WBC, UA >50 (*)    Bacteria, UA FEW (*)    All other components within normal limits  URINE CULTURE    PROCEDURES:  Critical Care performed: No  Procedures   MEDICATIONS ORDERED IN ED: Medications  oxyCODONE-acetaminophen (PERCOCET/ROXICET) 5-325 MG per tablet 1 tablet (1 tablet Oral Given 11/25/21 1235)     IMPRESSION / MDM / ASSESSMENT AND PLAN / ED COURSE  I reviewed the triage vital signs and the nursing notes.                              56 y.o. male with past medical history of chronic back pain and avascular necrosis of right hip who presents to the ED complaining of gross hematuria for the past 3 to 4 months along with ongoing  chronic right hip pain.  Differential diagnosis includes, but is not limited to, bladder cancer, cystitis, pyelonephritis, kidney stone, benign bladder mass.  Patient well-appearing and in no acute distress, vital signs are unremarkable and he has a benign abdominal exam.  He was able to provide grossly bloody urine sample, complains of dysuria similar to prior visits.  I would favor symptoms are more likely due to bladder mass seen on prior CT imaging rather than acute infection.  He has had multiple rounds of antibiotics with no improvement in symptoms, prior cultures were reviewed and either showed multiple species or insignificant growth.  He primarily is in need of outpatient cystoscopy for bladder mass, but has not yet followed up with urology.  I will touch base with urology today to assist with follow-up.  As for his right hip, avascular necrosis was also noted on prior CT imaging, he has not had any recent trauma and is ambulating without difficulty today.  No repeat imaging indicated at this time and he is appropriate for outpatient orthopedic follow-up.  Labs today are reassuring with CBC showing no anemia or leukocytosis, BMP without electrolyte abnormality or AKI, LFTs within normal limits.  Case discussed with Dr. Erlene Quan of urology, apparently patient has had appointment made for him in the past for follow-up, but then has no showed for the appointment.  She was able to have the patient scheduled at 1030 this coming Thursday for outpatient follow-up and I emphasized to the patient multiple times that it is very important he makes this appointment, otherwise this could be a life-threatening issue.  Patient expresses understanding, was provided with paperwork noting time and location of appointment.      FINAL CLINICAL IMPRESSION(S) / ED DIAGNOSES   Final diagnoses:  Gross hematuria  Bladder mass     Rx / DC Orders   ED Discharge Orders     None        Note:  This document  was prepared using Dragon voice recognition software and may include unintentional dictation errors.   Blake Divine, MD 11/25/21 1329

## 2021-11-25 NOTE — ED Triage Notes (Signed)
Reports chronic right hip pain, "I'm supposed to have a hip replacement" ongoing for months. Pt also reports that he is "peeing blood" for the last 2 months. Has been seen for same X 3 per patient reports. Burning sensation when urinating.

## 2021-11-25 NOTE — ED Provider Triage Note (Signed)
Emergency Medicine Provider Triage Evaluation Note  Eddie Rocha , a 56 y.o. male  was evaluated in triage.  Pt complains of chronic pain in his hip, but also noting blood in his urine today.  Review of Systems  Positive: Patient reports he has seen blood in his urine, this is happened twice in the past and reports being evaluated for same.  Also having chronic right hip pain reports he is scheduled to have hip replacement orthopedic evaluation soon Negative: Fever nausea or vomiting  Physical Exam  BP (!) 147/70 (BP Location: Left Arm)   Pulse 81   Temp 98.1 F (36.7 C)   Resp 18   Ht '5\' 8"'$  (1.727 m)   Wt 61.2 kg   SpO2 96%   BMI 20.53 kg/m  Gen:   Awake, no distress   Resp:  Normal effort  MSK:   Moves extremities without difficulty  Other:    Medical Decision Making  Medically screening exam initiated at 11:46 AM.  Appropriate orders placed.  Eddie Rocha was informed that the remainder of the evaluation will be completed by another provider, this initial triage assessment does not replace that evaluation, and the importance of remaining in the ED until their evaluation is complete.     Delman Kitten, MD 11/25/21 1359

## 2021-11-26 ENCOUNTER — Telehealth: Payer: Self-pay

## 2021-11-26 NOTE — Telephone Encounter (Signed)
Transition Care Management Follow-up Telephone Call Date of discharge and from where: 11/25/2021-[facility] How have you been since you were released from the hospital? Pt stated he is doing fine and there has been no medication changes.  Any questions or concerns? No  Items Reviewed: Did the pt receive and understand the discharge instructions provided? Yes  Medications obtained and verified?  No medication changes at discharge Other? No  Any new allergies since your discharge? No  Dietary orders reviewed? No Do you have support at home? Yes   Home Care and Equipment/Supplies: Were home health services ordered? not applicable If so, what is the name of the agency? N/A  Has the agency set up a time to come to the patient's home? not applicable Were any new equipment or medical supplies ordered?  No What is the name of the medical supply agency? N/A Were you able to get the supplies/equipment? not applicable Do you have any questions related to the use of the equipment or supplies? No  Functional Questionnaire: (I = Independent and D = Dependent) ADLs: I  Bathing/Dressing- I  Meal Prep- I  Eating- I  Maintaining continence- I  Transferring/Ambulation- I  Managing Meds- I  Follow up appointments reviewed:  PCP Hospital f/u appt confirmed? No   Specialist Hospital f/u appt confirmed? Yes  Scheduled to see Urologist on 11/28/2021. Are transportation arrangements needed? No  If their condition worsens, is the pt aware to call PCP or go to the Emergency Dept.? Yes Was the patient provided with contact information for the PCP's office or ED? Yes Was to pt encouraged to call back with questions or concerns? Yes

## 2021-11-26 NOTE — Patient Instructions (Signed)
Thank you for speaking with me today regarding care management and care coordination needs.  If you change your mind about [facility] services, I can be reached at [phone].  Thank you  Mickel Fuchs, BSW, [place] Medicaid Team  [phone]

## 2021-11-26 NOTE — Patient Outreach (Signed)
Care Coordination  11/26/2021  JAIMIN KRUPKA 1966/01/19 [id number]  BSW completed telephone outreach with patient to offer MM services. [facility] was not working. Patient stated he did have his card. BSW informed patient to contact the phone number on the back of the card patient understood and declined MM services.    Mickel Fuchs, BSW, [place] Managed Medicaid Team  [phone]

## 2021-11-27 LAB — URINE CULTURE: Culture: <10000 — AB

## 2021-11-28 ENCOUNTER — Telehealth: Payer: Self-pay

## 2021-11-28 ENCOUNTER — Encounter: Payer: Self-pay | Admitting: Urology

## 2021-11-28 ENCOUNTER — Other Ambulatory Visit: Payer: Self-pay

## 2021-11-28 ENCOUNTER — Ambulatory Visit (INDEPENDENT_AMBULATORY_CARE_PROVIDER_SITE_OTHER): Payer: Medicaid Other | Admitting: Urology

## 2021-11-28 VITALS — BP 123/77 | HR 74 | Ht 68.0 in | Wt 135.0 lb

## 2021-11-28 DIAGNOSIS — N3289 Other specified disorders of bladder: Secondary | ICD-10-CM | POA: Diagnosis not present

## 2021-11-28 DIAGNOSIS — R319 Hematuria, unspecified: Secondary | ICD-10-CM

## 2021-11-28 DIAGNOSIS — D494 Neoplasm of unspecified behavior of bladder: Secondary | ICD-10-CM

## 2021-11-28 LAB — MICROSCOPIC EXAMINATION
Bacteria, UA: NONE SEEN
RBC, Urine: >30 /hpf — AB (ref 0–2)

## 2021-11-28 LAB — URINALYSIS, COMPLETE
Bilirubin, UA: NEGATIVE
Glucose, UA: NEGATIVE
Ketones, UA: NEGATIVE
Nitrite, UA: NEGATIVE
Specific Gravity, UA: 1.02 (ref 1.005–1.030)
Urobilinogen, Ur: 0.2 mg/dL (ref 0.2–1.0)
pH, UA: 7 (ref 5.0–7.5)

## 2021-11-28 NOTE — H&P (View-Only) (Signed)
11/28/21 11:59 AM   Eddie Rocha [birth year] [id number]  Referring provider:  No referring provider defined for this encounter. Chief Complaint  Patient presents with   Hematuria     HPI: Eddie Rocha is a 56 y.o.male who presents today for follow-up post ER visit.   He was seen in the ED on 11/25/2021. He presented with complaints of hematuria that had been ongoing for 3-4 months and burning with urination. He had been seen in the ED on 4 separate occasions, prescribed antibiotics each time with no improvement in his symptoms. UA showed large Hgb, small leukocytes, >50 RBCs, >50 WBCs, and few bacteria. CT abdomen and pelvis visualized Sessile masslike enhancement along the anterior inferior aspect of the bladder, which measures approximately 3.6 x 2.9 x 1.1 cm.  The bladder mucosa was somewhat hyperenhancing diffusely and the wall of the bladder is somewhat thickened.  Notably, he has been seen several times in the emergency room for the same issue.  He has been scheduled but failed to follow-up here at least once.  He reports that he has been smoking since he was 16 x1 pack every 2 days.   He has been having hip pain, he was seen in the hospital and was told his bones were rubbing together.  He is requesting narcotic pain medication today.   PMH: Past Medical History:  Diagnosis Date   Chronic back pain    Hernia     Surgical History: Past Surgical History:  Procedure Laterality Date   BACK SURGERY     HERNIA REPAIR     LAMINECTOMY  07/06/2013   T9    T10    CORD COMPRESSION   LUMBAR LAMINECTOMY/DECOMPRESSION MICRODISCECTOMY N/A 07/06/2013   Procedure: LUMBAR LAMINECTOMY, DECOMPRESSION  T9 to T10;  Surgeon: Consuella Lose, MD;  Location: [facility];  Service: Neurosurgery;  Laterality: N/A;   MANDIBLE FRACTURE SURGERY      Home Medications:  Allergies as of 11/28/2021   No Known Allergies      Medication List        Accurate as of Nov 28, 2021  11:59 PM. If you have any questions, ask your nurse or doctor.          STOP taking these medications    gabapentin 300 MG capsule Commonly known as: NEURONTIN Stopped by: Hollice Espy, MD   methylPREDNISolone 4 MG Tbpk tablet Commonly known as: MEDROL DOSEPAK Stopped by: Hollice Espy, MD   naproxen 500 MG tablet Commonly known as: Naprosyn Stopped by: Hollice Espy, MD   oxyCODONE-acetaminophen 5-325 MG tablet Commonly known as: Percocet Stopped by: Hollice Espy, MD   sulfamethoxazole-trimethoprim 800-160 MG tablet Commonly known as: Bactrim DS Stopped by: Hollice Espy, MD   traMADol 50 MG tablet Commonly known as: Ultram Stopped by: Hollice Espy, MD        Allergies:  No Known Allergies  Family History: Family History  Problem Relation Age of Onset   Cancer Brother        throat    Social History:  reports that he has been smoking cigarettes. He has a 28.00 pack-year smoking history. He has never used smokeless tobacco. He reports that he does not currently use alcohol. He reports that he does not use drugs.   Physical Exam: BP 123/77   Pulse 74   Ht '5\' 8"'$  (1.727 m)   Wt 135 lb (61.2 kg)   BMI 20.53 kg/m   Constitutional:  Alert and oriented, No  acute distress. HEENT: [place] AT, moist mucus membranes.  Trachea midline, no masses. Cardiovascular: No clubbing, cyanosis, or edema. Respiratory: Normal respiratory effort, no increased work of breathing. Skin: No rashes, bruises or suspicious lesions. Neurologic: Grossly intact, no focal deficits, moving all 4 extremities. Psychiatric: Normal mood and affect.  Laboratory Data:  Lab Results  Component Value Date   CREATININE 1.06 11/25/2021    Urinalysis > 30 wbcs, 6-10 WBCs, otherwise fairly unremarkable.   Pertinent Imaging: CLINICAL DATA:  Abdominal pain and ascites present in his stomach   EXAM: CT ABDOMEN AND PELVIS WITH CONTRAST   TECHNIQUE: Multidetector CT imaging of the  abdomen and pelvis was performed using the standard protocol following bolus administration of intravenous contrast.   RADIATION DOSE REDUCTION: This exam was performed according to the departmental dose-optimization program which includes automated exposure control, adjustment of the mA and/or kV according to patient size and/or use of iterative reconstruction technique.   CONTRAST:  12m OMNIPAQUE IOHEXOL 300 MG/ML  SOLN   COMPARISON:  No prior abdomen pelvis CT, correlation is made with hip radiographs 11/30/2020   FINDINGS: Lower chest: Dependent atelectasis. No focal pulmonary opacity or pleural effusion. No pericardial effusion.   Hepatobiliary: No focal hepatic lesion. No intra or extrahepatic biliary ductal dilatation. The gallbladder is unremarkable. The hepatic and portal veins are patent.   Pancreas: Unremarkable. No pancreatic ductal dilatation or surrounding inflammatory changes.   Spleen: Normal in size without focal abnormality.   Adrenals/Urinary Tract: The adrenal glands are unremarkable. The kidneys enhance symmetrically with no hydronephrosis. Sessile masslike enhancement along the anterior inferior aspect of the bladder, which measures approximately 3.6 x 2.9 x 1.1 cm (AP x TR x CC) (series 6, image 58 and series 5, image 33). The bladder mucosa is somewhat hyperenhancing diffusely and the wall of the bladder is somewhat thickened.   Stomach/Bowel: Stomach is within normal limits. Appendix appears normal. No evidence of bowel wall thickening, distention, or inflammatory changes.   Vascular/Lymphatic: Aortic atherosclerosis. No enlarged abdominal or pelvic lymph nodes.   Reproductive: Prostate is unremarkable.   Other: No free fluid or free air in the abdomen or pelvis.   Musculoskeletal: Degenerative joint space narrowing in the right-greater-than-left hip joint, with lucency and sclerosis again noted in the right femoral head, suggestive of  avascular necrosis, with subchondral fractures, which appear new from the 11/30/2020 radiographs. Additional subchondral lucency is noted in the left femoral head, which is also concerning for avascular necrosis and not apparent on the prior radiographs. Mild S shaped curvature to the thoracolumbar spine.   IMPRESSION: 1. Sessile masslike enhancement along the anterior inferior aspect of the bladder, concerning for primary neoplasm. Cystoscopy is recommended. 2. Additional diffuse hyperenhancement and thickening of the bladder mucosa, concerning for cystitis. 3. AVN of the right femoral head, which appears to progressed since the 11/30/2020 radiographs, with subchondral fractures. In addition there is now subchondral lucency in the left femoral head, also concerning for avascular necrosis.     Electronically Signed   By: AMerilyn BabaM.D.   On: 10/09/2021 14:53   I have personally reviewed the images and agree with radiologist interpretation.     Assessment & Plan:    Bladder lesion - High risk of bladder cancer in light of smoking history  - Based on imaging findings highly concerning of bladder cancer would recommend proceeding to the OR for TURBT, bilateral retrograde, and intravesical gemcitabine.  - Bladder perforation  possible need for stent or foley, need  for further procedure, and bleeding amongst other risk were discussed in detail.  All of his questions were answered.  2. Right hip pain  - he is requesting more narcotics for chronic right hip pain, given that this is a non-GI issue I declined to fill this today and advised him follow-up with his orthopedist - Recommend he follow-up with with them in regards to narcotics for a non urological issue  Schedule surgery  Conley Rolls as a scribe for Hollice Espy, MD.,have documented all relevant documentation on the behalf of Hollice Espy, MD,as directed by  Hollice Espy, MD while in the presence of  Hollice Espy, [place] [address], [place], [place] [address] [phone]

## 2021-11-28 NOTE — Telephone Encounter (Signed)
I spoke with Eddie Rocha while in office. We have discussed possible surgery dates and Thursday June 1st, 2023 was agreed upon by all parties. Patient given information about surgery date, what to expect pre-operatively and post operatively.  We discussed that a Pre-Admission Testing office will be calling to set up the pre-op visit that will take place prior to surgery, and that these appointments are typically done over the phone with a Pre-Admissions RN.  Informed patient that our office will communicate any additional care to be provided after surgery. Patients questions or concerns were discussed during our call. Advised to call our office should there be any additional information, questions or concerns that arise. Patient verbalized understanding.

## 2021-11-28 NOTE — Progress Notes (Signed)
[facility] Posting Form   Surgery Date/Time: Date: 12/05/2021  Surgeon: Dr. Hollice Espy, MD  Surgery Location: Day Surgery  Inpt ( No  )   Outpt (Yes)   Obs ( No  )   Diagnosis: D49.4 Bladder Tumor  -CPT: 64332, [id number], [phone]  Surgery: Transurethral Resection of Bladder Tumor with intravesical instillation of Gemcitabine and bilateral retrograde pyelograms  Stop Anticoagulations: No  Cardiac/Medical/Pulmonary Clearance needed: no  *Orders entered into EPIC  Date: 11/28/21   *Case booked in EPIC  Date: 11/28/21  *Notified pt of Surgery: Date: 11/28/21  PRE-OP UA & CX: no  *Placed into Prior Authorization Work Que Date: 11/28/21   Assistant/laser/rep:No

## 2021-11-28 NOTE — Patient Instructions (Signed)
Transurethral Resection of Bladder Tumor  Transurethral resection of a bladder tumor is the removal (resection) of cancerous tissue (tumor) from the inside wall of the bladder. The bladder is the organ that holds urine. The tumor is removed through the tube that carries urine out of the body (urethra). In a transurethral resection, a thin telescope with a light, a tiny camera, and an electric cutting edge (resectoscope) is passed through the urethra. In men, the opening of the urethra is at the end of the penis. In women, it is just above the opening of the vagina. Tell a health care provider about: Any allergies you have. All medicines you are taking, including vitamins, herbs, eye drops, creams, and over-the-counter medicines. Any problems you or family members have had with anesthetic medicines. Any bleeding problems you have. Any surgeries you have had. Any medical conditions you have, including recent urinary tract infections. Whether you are pregnant or may be pregnant. What are the risks? Generally, this is a safe procedure. However, problems may occur, including: Infection. Bleeding. Allergic reactions to medicines. Damage to nearby structures or organs. Difficulty urinating from blockage of the urethra or not being able to urinate (urinary retention). Deep vein thrombosis. This is a blood clot that can develop in your leg. Recurring cancer. What happens before the procedure? When to stop eating and drinking Follow instructions from your health care provider about what you may eat and drink before your procedure. These may include: 8 hours before your procedure Stop eating most foods. Do not eat meat, fried foods, or fatty foods. Eat only light foods, such as toast or crackers. All liquids are okay except energy drinks and alcohol. 6 hours before your procedure Stop eating. Drink only clear liquids, such as water, clear fruit juice, black coffee, plain tea, and sports  drinks. Do not drink energy drinks or alcohol. 2 hours before your procedure Stop drinking all liquids. You may be allowed to take medicines with small sips of water. Medicines Ask your health care provider about: Changing or stopping your regular medicines. This is especially important if you are taking diabetes medicines or blood thinners. Taking medicines such as aspirin and ibuprofen. These medicines can thin your blood. Do not take these medicines unless your health care provider tells you to take them. Taking over-the-counter medicines, vitamins, herbs, and supplements. General instructions If you will be going home right after the procedure, plan to have a responsible adult: Take you home from the hospital or clinic. You will not be allowed to drive. Care for you for the time you are told. Ask your health care provider what steps will be taken to help prevent infection. These steps may include: Washing skin with a germ-killing soap. Taking antibiotic medicine. Do not use any products that contain nicotine or tobacco for at least 4 weeks before the procedure. These products include cigarettes, chewing tobacco, and vaping devices, such as e-cigarettes. If you need help quitting, ask your health care provider. What happens during the procedure? An IV will be inserted into one of your veins. You will be given one or more of the following: A medicine to help you relax (sedative). A medicine that is injected into your spine to numb the area below and slightly above the injection site (spinal anesthetic). A medicine that is injected into an area of your body to numb everything below the injection site (regional anesthetic). A medicine to make you fall asleep (general anesthetic). Your legs will be placed in foot rests (  stirrups) to open your legs and bend your knees. The resectoscope will be passed through your urethra and into your bladder. The part of your bladder with the tumor will be  resected by the cutting edge of the resectoscope. Fluid will be passed to rinse out the cut tissues (irrigation). The resectoscope will then be taken out. A small, thin tube (catheter) will be passed through your urethra and into your bladder. The catheter will drain urine into a bag outside of your body. The procedure may vary among health care providers and hospitals. What happens after the procedure? Your blood pressure, heart rate, breathing rate, and blood oxygen level will be monitored until you leave the hospital or clinic. You may continue to receive fluids and medicines through an IV. You will be given pain medicine to relieve pain. You will have a catheter to drain your urine. The amount of urine will be measured. If you have blood in your urine, your bladder may be rinsed out by passing fluid through your catheter. You will be encouraged to walk as soon as you can. You may have to wear compression stockings. These stockings help to prevent blood clots and reduce swelling in your legs. If you were given a sedative during the procedure, it can affect you for several hours. Do not drive or operate machinery until your health care provider says that it is safe. Summary Transurethral resection of a bladder tumor is the removal (resection) of a cancerous growth (tumor) on the inside wall of the bladder. To do this procedure, your health care provider uses a thin telescope with a light, a tiny camera, and an electric cutting edge (resectoscope) that is guided to your bladder through your urethra. The part of your bladder that is affected by the tumor will be resected by the cutting edge of the resectoscope. A catheter will be passed through your urethra and into your bladder. The catheter will drain urine into a bag outside of your body. If you will be going home right after the procedure, plan to have a responsible adult take you home from the hospital or clinic. You will not be allowed to  drive. This information is not intended to replace advice given to you by your health care provider. Make sure you discuss any questions you have with your health care provider. Document Revised: 06/28/2021 Document Reviewed: 06/28/2021 Elsevier Patient Education  2023 Elsevier Inc.  

## 2021-11-28 NOTE — Progress Notes (Signed)
11/28/21 11:59 AM   Eddie Rocha 09-11-1965 [id number]  Referring provider:  No referring provider defined for this encounter. Chief Complaint  Patient presents with   Hematuria     HPI: Eddie Rocha is a 56 y.o.male who presents today for follow-up post ER visit.   He was seen in the ED on 11/25/2021. He presented with complaints of hematuria that had been ongoing for 3-4 months and burning with urination. He had been seen in the ED on 4 separate occasions, prescribed antibiotics each time with no improvement in his symptoms. UA showed large Hgb, small leukocytes, >50 RBCs, >50 WBCs, and few bacteria. CT abdomen and pelvis visualized Sessile masslike enhancement along the anterior inferior aspect of the bladder, which measures approximately 3.6 x 2.9 x 1.1 cm.  The bladder mucosa was somewhat hyperenhancing diffusely and the wall of the bladder is somewhat thickened.  Notably, he has been seen several times in the emergency room for the same issue.  He has been scheduled but failed to follow-up here at least once.  He reports that he has been smoking since he was 16 x1 pack every 2 days.   He has been having hip pain, he was seen in the hospital and was told his bones were rubbing together.  He is requesting narcotic pain medication today.   PMH: Past Medical History:  Diagnosis Date   Chronic back pain    Hernia     Surgical History: Past Surgical History:  Procedure Laterality Date   BACK SURGERY     HERNIA REPAIR     LAMINECTOMY  07/06/2013   T9    T10    CORD COMPRESSION   LUMBAR LAMINECTOMY/DECOMPRESSION MICRODISCECTOMY N/A 07/06/2013   Procedure: LUMBAR LAMINECTOMY, DECOMPRESSION  T9 to T10;  Surgeon: Consuella Lose, MD;  Location: [facility];  Service: Neurosurgery;  Laterality: N/A;   MANDIBLE FRACTURE SURGERY      Home Medications:  Allergies as of 11/28/2021   No Known Allergies      Medication List        Accurate as of Nov 28, 2021  11:59 PM. If you have any questions, ask your nurse or doctor.          STOP taking these medications    gabapentin 300 MG capsule Commonly known as: NEURONTIN Stopped by: Hollice Espy, MD   methylPREDNISolone 4 MG Tbpk tablet Commonly known as: MEDROL DOSEPAK Stopped by: Hollice Espy, MD   naproxen 500 MG tablet Commonly known as: Naprosyn Stopped by: Hollice Espy, MD   oxyCODONE-acetaminophen 5-325 MG tablet Commonly known as: Percocet Stopped by: Hollice Espy, MD   sulfamethoxazole-trimethoprim 800-160 MG tablet Commonly known as: Bactrim DS Stopped by: Hollice Espy, MD   traMADol 50 MG tablet Commonly known as: Ultram Stopped by: Hollice Espy, MD        Allergies:  No Known Allergies  Family History: Family History  Problem Relation Age of Onset   Cancer Brother        throat    Social History:  reports that he has been smoking cigarettes. He has a 28.00 pack-year smoking history. He has never used smokeless tobacco. He reports that he does not currently use alcohol. He reports that he does not use drugs.   Physical Exam: BP 123/77   Pulse 74   Ht '5\' 8"'$  (1.727 m)   Wt 135 lb (61.2 kg)   BMI 20.53 kg/m   Constitutional:  Alert and oriented, No  acute distress. HEENT: [place] AT, moist mucus membranes.  Trachea midline, no masses. Cardiovascular: No clubbing, cyanosis, or edema. Respiratory: Normal respiratory effort, no increased work of breathing. Skin: No rashes, bruises or suspicious lesions. Neurologic: Grossly intact, no focal deficits, moving all 4 extremities. Psychiatric: Normal mood and affect.  Laboratory Data:  Lab Results  Component Value Date   CREATININE 1.06 11/25/2021    Urinalysis > 30 wbcs, 6-10 WBCs, otherwise fairly unremarkable.   Pertinent Imaging: CLINICAL DATA:  Abdominal pain and ascites present in his stomach   EXAM: CT ABDOMEN AND PELVIS WITH CONTRAST   TECHNIQUE: Multidetector CT imaging of the  abdomen and pelvis was performed using the standard protocol following bolus administration of intravenous contrast.   RADIATION DOSE REDUCTION: This exam was performed according to the departmental dose-optimization program which includes automated exposure control, adjustment of the mA and/or kV according to patient size and/or use of iterative reconstruction technique.   CONTRAST:  170m OMNIPAQUE IOHEXOL 300 MG/ML  SOLN   COMPARISON:  No prior abdomen pelvis CT, correlation is made with hip radiographs 11/30/2020   FINDINGS: Lower chest: Dependent atelectasis. No focal pulmonary opacity or pleural effusion. No pericardial effusion.   Hepatobiliary: No focal hepatic lesion. No intra or extrahepatic biliary ductal dilatation. The gallbladder is unremarkable. The hepatic and portal veins are patent.   Pancreas: Unremarkable. No pancreatic ductal dilatation or surrounding inflammatory changes.   Spleen: Normal in size without focal abnormality.   Adrenals/Urinary Tract: The adrenal glands are unremarkable. The kidneys enhance symmetrically with no hydronephrosis. Sessile masslike enhancement along the anterior inferior aspect of the bladder, which measures approximately 3.6 x 2.9 x 1.1 cm (AP x TR x CC) (series 6, image 58 and series 5, image 33). The bladder mucosa is somewhat hyperenhancing diffusely and the wall of the bladder is somewhat thickened.   Stomach/Bowel: Stomach is within normal limits. Appendix appears normal. No evidence of bowel wall thickening, distention, or inflammatory changes.   Vascular/Lymphatic: Aortic atherosclerosis. No enlarged abdominal or pelvic lymph nodes.   Reproductive: Prostate is unremarkable.   Other: No free fluid or free air in the abdomen or pelvis.   Musculoskeletal: Degenerative joint space narrowing in the right-greater-than-left hip joint, with lucency and sclerosis again noted in the right femoral head, suggestive of  avascular necrosis, with subchondral fractures, which appear new from the 11/30/2020 radiographs. Additional subchondral lucency is noted in the left femoral head, which is also concerning for avascular necrosis and not apparent on the prior radiographs. Mild S shaped curvature to the thoracolumbar spine.   IMPRESSION: 1. Sessile masslike enhancement along the anterior inferior aspect of the bladder, concerning for primary neoplasm. Cystoscopy is recommended. 2. Additional diffuse hyperenhancement and thickening of the bladder mucosa, concerning for cystitis. 3. AVN of the right femoral head, which appears to progressed since the 11/30/2020 radiographs, with subchondral fractures. In addition there is now subchondral lucency in the left femoral head, also concerning for avascular necrosis.     Electronically Signed   By: AMerilyn BabaM.D.   On: 10/09/2021 14:53   I have personally reviewed the images and agree with radiologist interpretation.     Assessment & Plan:    Bladder lesion - High risk of bladder cancer in light of smoking history  - Based on imaging findings highly concerning of bladder cancer would recommend proceeding to the OR for TURBT, bilateral retrograde, and intravesical gemcitabine.  - Bladder perforation  possible need for stent or foley, need  for further procedure, and bleeding amongst other risk were discussed in detail.  All of his questions were answered.  2. Right hip pain  - he is requesting more narcotics for chronic right hip pain, given that this is a non-GI issue I declined to fill this today and advised him follow-up with his orthopedist - Recommend he follow-up with with them in regards to narcotics for a non urological issue  Schedule surgery  Conley Rolls as a scribe for Hollice Espy, MD.,have documented all relevant documentation on the behalf of Hollice Espy, MD,as directed by  Hollice Espy, MD while in the presence of  Hollice Espy, [place] [address], [place], [place] [address] [phone]

## 2021-11-29 ENCOUNTER — Inpatient Hospital Stay
Admission: RE | Admit: 2021-11-29 | Discharge: 2021-11-29 | Disposition: A | Discharge status: Home / Self Care | Payer: Medicaid Other | Source: Ambulatory Visit

## 2021-11-29 NOTE — Progress Notes (Signed)
Called and left message again for the fourth times in attempt to complete the pre-anesthesia interview for pre-admission testing but this RN is unsuccessful.

## 2021-11-29 NOTE — Patient Instructions (Signed)
Your procedure is scheduled on:   12/05/2021 Report to the Registration Desk on the 1st floor of the [facility]. To find out your arrival time, please call [phone] between 1PM - 3PM on: 12/04/2021  If your arrival time is 6:00 am, do not arrive prior to that time as the [facility] entrance doors do not open until 6:00 am.  REMEMBER: Instructions that are not followed completely may result in serious medical risk, up to and including death; or upon the discretion of your surgeon and anesthesiologist your surgery may need to be rescheduled.  Do not eat or drink after midnight the night before surgery.  No gum chewing, lozengers or hard candies.   TAKE THESE MEDICATIONS THE MORNING OF SURGERY WITH A SIP OF WATER: None  One week prior to surgery: Stop Anti-inflammatories (NSAIDS) such as Advil, Aleve, Ibuprofen, Motrin, Naproxen, Naprosyn and Aspirin based products such as Excedrin, Goodys Powder, BC Powder.  Stop ANY OVER THE COUNTER supplements until after surgery.  You may however, continue to take Tylenol if needed for pain up until the day of surgery.  No Alcohol for 24 hours before or after surgery.  No Smoking including e-cigarettes for 24 hours prior to surgery.  No chewable tobacco products for at least 6 hours prior to surgery.  No nicotine patches on the day of surgery.  Do not use any "recreational" drugs for at least a week prior to your surgery.  Please be advised that the combination of cocaine and anesthesia may have negative outcomes, up to and including death. If you test positive for cocaine, your surgery will be cancelled.  On the morning of surgery brush your teeth with toothpaste and water, you may rinse your mouth with mouthwash if you wish. Do not swallow any toothpaste or mouthwash.  Do not wear jewelry.  Do not wear lotions, powders, or colognes.   Do not shave body from the neck down 48 hours prior to surgery just in case you cut yourself which  could leave a site for infection.  Also, freshly shaved skin may become irritated if using the CHG soap.  Do not bring valuables to the hospital. [facility] is not responsible for any missing/lost belongings or valuables.   Notify your doctor if there is any change in your medical condition (cold, fever, infection).  Wear comfortable clothing (specific to your surgery type) to the hospital.  After surgery, you can help prevent lung complications by doing breathing exercises.  Take deep breaths and cough every 1-2 hours.   If you are being discharged the day of surgery, you will not be allowed to drive home. You will need a responsible adult (18 years or older) to drive you home and stay with you that night.   If you are taking public transportation, you will need to have a responsible adult (18 years or older) with you. Please confirm with your physician that it is acceptable to use public transportation.   Please call the [facility]. at [phone] if you have any questions about these instructions.  Surgery Visitation Policy:  Patients undergoing a surgery or procedure may have two family members or support persons with them as long as the person is not COVID-19 positive or experiencing its symptoms.

## 2021-11-29 NOTE — Pre-Procedure Instructions (Signed)
Attempted to interview patient but got disconnected every time. I left message several times about the importance of pre-admission testing interview. At this time, interview has not been completed and patient instructions has not been given due to disconnection issue.

## 2021-12-01 LAB — CULTURE, URINE COMPREHENSIVE

## 2021-12-03 ENCOUNTER — Other Ambulatory Visit: Payer: Self-pay

## 2021-12-03 ENCOUNTER — Encounter
Admission: RE | Admit: 2021-12-03 | Discharge: 2021-12-03 | Disposition: A | Discharge status: Home / Self Care | Payer: Medicaid Other | Source: Ambulatory Visit | Attending: Urology | Admitting: Urology

## 2021-12-05 ENCOUNTER — Encounter
Admission: RE | Disposition: A | Discharge status: Home / Self Care | Payer: Self-pay | Source: Home / Self Care | Attending: Urology

## 2021-12-05 ENCOUNTER — Ambulatory Visit: Payer: Medicaid Other

## 2021-12-05 ENCOUNTER — Other Ambulatory Visit: Payer: Self-pay

## 2021-12-05 ENCOUNTER — Ambulatory Visit
Admission: RE | Admit: 2021-12-05 | Discharge: 2021-12-05 | Disposition: A | Discharge status: Home / Self Care | Payer: Medicaid Other | Attending: Urology | Admitting: Urology

## 2021-12-05 ENCOUNTER — Encounter: Payer: Self-pay | Admitting: Urology

## 2021-12-05 ENCOUNTER — Ambulatory Visit: Payer: Medicaid Other | Admitting: Anesthesiology

## 2021-12-05 DIAGNOSIS — F17200 Nicotine dependence, unspecified, uncomplicated: Secondary | ICD-10-CM | POA: Diagnosis not present

## 2021-12-05 DIAGNOSIS — C678 Malignant neoplasm of overlapping sites of bladder: Secondary | ICD-10-CM | POA: Diagnosis not present

## 2021-12-05 DIAGNOSIS — D494 Neoplasm of unspecified behavior of bladder: Secondary | ICD-10-CM

## 2021-12-05 DIAGNOSIS — C679 Malignant neoplasm of bladder, unspecified: Secondary | ICD-10-CM | POA: Diagnosis not present

## 2021-12-05 DIAGNOSIS — G8929 Other chronic pain: Secondary | ICD-10-CM | POA: Insufficient documentation

## 2021-12-05 DIAGNOSIS — M549 Dorsalgia, unspecified: Secondary | ICD-10-CM | POA: Diagnosis not present

## 2021-12-05 HISTORY — PX: CYSTOSCOPY W/ RETROGRADES: SHX1426

## 2021-12-05 HISTORY — PX: TRANSURETHRAL RESECTION OF BLADDER TUMOR: SHX2575

## 2021-12-05 SURGERY — TURBT (TRANSURETHRAL RESECTION OF BLADDER TUMOR)
Anesthesia: General

## 2021-12-05 MED ORDER — SUCCINYLCHOLINE CHLORIDE 200 MG/10ML IV SOSY
PREFILLED_SYRINGE | INTRAVENOUS | Status: DC | PRN
  Administered 2021-12-05: 120 mg via INTRAVENOUS

## 2021-12-05 MED ORDER — FENTANYL CITRATE (PF) 100 MCG/2ML IJ SOLN
INTRAMUSCULAR | Status: AC
  Filled 2021-12-05: qty 2

## 2021-12-05 MED ORDER — MIDAZOLAM HCL 2 MG/2ML IJ SOLN
INTRAMUSCULAR | Status: DC | PRN
  Administered 2021-12-05: 2 mg via INTRAVENOUS

## 2021-12-05 MED ORDER — LACTATED RINGERS IV SOLN: INTRAVENOUS | Status: DC

## 2021-12-05 MED ORDER — ONDANSETRON HCL 4 MG/2ML IJ SOLN
INTRAMUSCULAR | Status: DC | PRN
  Administered 2021-12-05: 4 mg via INTRAVENOUS

## 2021-12-05 MED ORDER — CHLORHEXIDINE GLUCONATE 0.12 % MT SOLN: 15.0000 mL | Freq: Once | OROMUCOSAL | Status: AC

## 2021-12-05 MED ORDER — FENTANYL CITRATE (PF) 100 MCG/2ML IJ SOLN
INTRAMUSCULAR | Status: DC | PRN
  Administered 2021-12-05 (×2): 25 ug via INTRAVENOUS
  Administered 2021-12-05 (×3): 50 ug via INTRAVENOUS

## 2021-12-05 MED ORDER — SODIUM CHLORIDE 0.9 % IR SOLN
Status: DC | PRN
  Administered 2021-12-05: 6000 mL

## 2021-12-05 MED ORDER — FENTANYL CITRATE (PF) 100 MCG/2ML IJ SOLN
25.0000 ug | INTRAMUSCULAR | Status: DC | PRN
  Administered 2021-12-05 (×2): 50 ug via INTRAVENOUS

## 2021-12-05 MED ORDER — ACETAMINOPHEN 10 MG/ML IV SOLN
INTRAVENOUS | Status: DC | PRN
  Administered 2021-12-05: 1000 mg via INTRAVENOUS

## 2021-12-05 MED ORDER — FENTANYL CITRATE (PF) 100 MCG/2ML IJ SOLN
INTRAMUSCULAR | Status: AC
  Administered 2021-12-05: 50 ug via INTRAVENOUS
  Filled 2021-12-05: qty 2

## 2021-12-05 MED ORDER — GEMCITABINE CHEMO FOR BLADDER INSTILLATION 2000 MG
2000.0000 mg | Freq: Once | INTRAVENOUS | Status: DC
  Filled 2021-12-05: qty 52.6

## 2021-12-05 MED ORDER — CHLORHEXIDINE GLUCONATE 0.12 % MT SOLN
OROMUCOSAL | Status: AC
  Administered 2021-12-05: 15 mL via OROMUCOSAL
  Filled 2021-12-05: qty 15

## 2021-12-05 MED ORDER — ORAL CARE MOUTH RINSE: 15.0000 mL | Freq: Once | OROMUCOSAL | Status: AC

## 2021-12-05 MED ORDER — DEXAMETHASONE SODIUM PHOSPHATE 10 MG/ML IJ SOLN
INTRAMUSCULAR | Status: DC | PRN
  Administered 2021-12-05: 10 mg via INTRAVENOUS

## 2021-12-05 MED ORDER — ACETAMINOPHEN 10 MG/ML IV SOLN
INTRAVENOUS | Status: AC
  Filled 2021-12-05: qty 100

## 2021-12-05 MED ORDER — OXYCODONE HCL 5 MG PO TABS
5.0000 mg | ORAL_TABLET | Freq: Once | ORAL | Status: AC | PRN
  Administered 2021-12-05: 5 mg via ORAL

## 2021-12-05 MED ORDER — CEFAZOLIN SODIUM-DEXTROSE 2-4 GM/100ML-% IV SOLN
2.0000 g | INTRAVENOUS | Status: AC
  Administered 2021-12-05: 2 g via INTRAVENOUS

## 2021-12-05 MED ORDER — OXYBUTYNIN CHLORIDE 5 MG PO TABS
5.0000 mg | ORAL_TABLET | Freq: Once | ORAL | Status: AC
Start: 2021-12-05 — End: 2021-12-05
  Administered 2021-12-05: 5 mg via ORAL
  Filled 2021-12-05: qty 1

## 2021-12-05 MED ORDER — ONDANSETRON HCL 4 MG/2ML IJ SOLN: 4.0000 mg | Freq: Once | INTRAMUSCULAR | Status: DC | PRN

## 2021-12-05 MED ORDER — IOHEXOL 180 MG/ML  SOLN
INTRAMUSCULAR | Status: DC | PRN
  Administered 2021-12-05: 20 mL

## 2021-12-05 MED ORDER — OXYBUTYNIN CHLORIDE 5 MG PO TABS
ORAL_TABLET | ORAL | Status: DC
Start: 2021-12-05 — End: 2021-12-05
  Filled 2021-12-05: qty 1

## 2021-12-05 MED ORDER — OXYCODONE HCL 5 MG/5ML PO SOLN: 5.0000 mg | Freq: Once | ORAL | Status: AC | PRN

## 2021-12-05 MED ORDER — DEXMEDETOMIDINE (PRECEDEX) IN NS 20 MCG/5ML (4 MCG/ML) IV SYRINGE
PREFILLED_SYRINGE | INTRAVENOUS | Status: DC | PRN
  Administered 2021-12-05 (×2): 8 ug via INTRAVENOUS
  Administered 2021-12-05: 4 ug via INTRAVENOUS

## 2021-12-05 MED ORDER — ACETAMINOPHEN 10 MG/ML IV SOLN: 1000.0000 mg | Freq: Once | INTRAVENOUS | Status: DC | PRN

## 2021-12-05 MED ORDER — SUGAMMADEX SODIUM 200 MG/2ML IV SOLN
INTRAVENOUS | Status: DC | PRN
  Administered 2021-12-05: 200 mg via INTRAVENOUS

## 2021-12-05 MED ORDER — LACTATED RINGERS IV SOLN
INTRAVENOUS | Status: DC
Start: 2021-12-05 — End: 2021-12-05

## 2021-12-05 MED ORDER — LIDOCAINE HCL (CARDIAC) PF 100 MG/5ML IV SOSY
PREFILLED_SYRINGE | INTRAVENOUS | Status: DC | PRN
  Administered 2021-12-05: 100 mg via INTRAVENOUS

## 2021-12-05 MED ORDER — ROCURONIUM BROMIDE 100 MG/10ML IV SOLN
INTRAVENOUS | Status: DC | PRN
  Administered 2021-12-05: 50 mg via INTRAVENOUS
  Administered 2021-12-05: 20 mg via INTRAVENOUS

## 2021-12-05 MED ORDER — MIDAZOLAM HCL 2 MG/2ML IJ SOLN
INTRAMUSCULAR | Status: AC
  Filled 2021-12-05: qty 2

## 2021-12-05 MED ORDER — CEFAZOLIN SODIUM-DEXTROSE 2-4 GM/100ML-% IV SOLN
INTRAVENOUS | Status: AC
  Filled 2021-12-05: qty 100

## 2021-12-05 MED ORDER — FAMOTIDINE 20 MG PO TABS: 20.0000 mg | ORAL_TABLET | Freq: Once | ORAL | Status: AC

## 2021-12-05 MED ORDER — OXYCODONE HCL 5 MG PO TABS
ORAL_TABLET | ORAL | Status: AC
  Filled 2021-12-05: qty 1

## 2021-12-05 MED ORDER — PROPOFOL 10 MG/ML IV BOLUS
INTRAVENOUS | Status: DC | PRN
  Administered 2021-12-05: 30 mg via INTRAVENOUS
  Administered 2021-12-05: 120 mg via INTRAVENOUS
  Administered 2021-12-05: 50 mg via INTRAVENOUS

## 2021-12-05 MED ORDER — FAMOTIDINE 20 MG PO TABS
ORAL_TABLET | ORAL | Status: AC
  Administered 2021-12-05: 20 mg via ORAL
  Filled 2021-12-05: qty 1

## 2021-12-05 MED ORDER — STERILE WATER FOR IRRIGATION IR SOLN
Status: DC | PRN
  Administered 2021-12-05: 200 mL

## 2021-12-05 MED ORDER — OXYCODONE-ACETAMINOPHEN 5-325 MG PO TABS: 1.0000 | ORAL_TABLET | ORAL | 0 refills | Status: DC | PRN

## 2021-12-05 MED ORDER — OXYBUTYNIN CHLORIDE 5 MG PO TABS
5.0000 mg | ORAL_TABLET | Freq: Three times a day (TID) | ORAL | 0 refills | Status: DC | PRN
Start: 2021-12-05 — End: 2022-01-03

## 2021-12-05 SURGICAL SUPPLY — 29 items
BAG DRAIN CYSTO-URO LG1000N (MISCELLANEOUS) ×3 IMPLANT
BAG DRN RND TRDRP ANRFLXCHMBR (UROLOGICAL SUPPLIES) ×2
BAG URINE DRAIN 2000ML AR STRL (UROLOGICAL SUPPLIES) ×3 IMPLANT
BRUSH SCRUB EZ 1% IODOPHOR (MISCELLANEOUS) ×3 IMPLANT
CATH FOLEY 2WAY  5CC 16FR (CATHETERS) ×3
CATH FOLEY 2WAY 5CC 16FR (CATHETERS) ×2
CATH URETL OPEN 5X70 (CATHETERS) ×3 IMPLANT
CATH URTH 16FR FL 2W BLN LF (CATHETERS) ×2 IMPLANT
DRAPE UTILITY 15X26 TOWEL STRL (DRAPES) ×3 IMPLANT
DRSG TELFA 4X3 1S NADH ST (GAUZE/BANDAGES/DRESSINGS) ×3 IMPLANT
ELECT COAG BIPOLAR CYL 1.2MMM (ELECTROSURGICAL) ×3
ELECT LOOP 22F BIPOLAR SML (ELECTROSURGICAL) ×3
ELECTRODE COAG BIPLR CYL 1.2MM (ELECTROSURGICAL) IMPLANT
ELECTRODE LOOP 22F BIPOLAR SML (ELECTROSURGICAL) IMPLANT
GLOVE BIO SURGEON STRL SZ 6.5 (GLOVE) ×3 IMPLANT
GOWN STRL REUS W/ TWL LRG LVL3 (GOWN DISPOSABLE) ×4 IMPLANT
GOWN STRL REUS W/TWL LRG LVL3 (GOWN DISPOSABLE) ×6
IV NS IRRIG 3000ML ARTHROMATIC (IV SOLUTION) ×10 IMPLANT
KIT TURNOVER CYSTO (KITS) ×3 IMPLANT
NDL SAFETY ECLIPSE 18X1.5 (NEEDLE) ×2 IMPLANT
NEEDLE HYPO 18GX1.5 SHARP (NEEDLE) ×3
PACK CYSTO AR (MISCELLANEOUS) ×3 IMPLANT
PAD ARMBOARD 7.5X6 YLW CONV (MISCELLANEOUS) ×3 IMPLANT
SET IRRIG Y TYPE TUR BLADDER L (SET/KITS/TRAYS/PACK) ×3 IMPLANT
SURGILUBE 2OZ TUBE FLIPTOP (MISCELLANEOUS) ×3 IMPLANT
SYR TOOMEY IRRIG 70ML (MISCELLANEOUS) ×3
SYRINGE TOOMEY IRRIG 70ML (MISCELLANEOUS) ×2 IMPLANT
WATER STERILE IRR 1000ML POUR (IV SOLUTION) ×3 IMPLANT
WATER STERILE IRR 500ML POUR (IV SOLUTION) ×3 IMPLANT

## 2021-12-05 NOTE — Anesthesia Postprocedure Evaluation (Signed)
Anesthesia Post Note  Patient: Eddie Rocha  Procedure(s) Performed: TRANSURETHRAL RESECTION OF BLADDER TUMOR (TURBT) CYSTOSCOPY WITH RETROGRADE PYELOGRAM (Bilateral)  Patient location during evaluation: PACU Anesthesia Type: General Level of consciousness: awake and alert, oriented and patient cooperative Pain management: pain level controlled Vital Signs Assessment: post-procedure vital signs reviewed and stable Respiratory status: spontaneous breathing, nonlabored ventilation and respiratory function stable Cardiovascular status: blood pressure returned to baseline and stable Postop Assessment: adequate PO intake Anesthetic complications: no   No notable events documented.   Last Vitals:  Vitals:   12/05/21 1017 12/05/21 1530  BP: 137/87   Pulse: 75   Resp: 17   Temp: (!) 36.3 C (!) 36.4 C  SpO2: 96%     Last Pain:  Vitals:   12/05/21 1017  TempSrc: Temporal  PainSc: 2                  Darrin Nipper

## 2021-12-05 NOTE — Transfer of Care (Signed)
Immediate Anesthesia Transfer of Care Note   Patient: Eddie Rocha  Procedure(s) Performed: TRANSURETHRAL RESECTION OF BLADDER TUMOR (TURBT) CYSTOSCOPY WITH RETROGRADE PYELOGRAM (Bilateral)  Patient Location: PACU  Anesthesia Type:General  Level of Consciousness: drowsy  Airway & Oxygen Therapy: Patient Spontanous Breathing and Patient connected to face mask oxygen  Post-op Assessment: Report given to RN and Post -op Vital signs reviewed and stable  Post vital signs: Reviewed and stable  Last Vitals:  Vitals Value Taken Time  BP    Temp    Pulse 88 12/05/21 1526  Resp 12 12/05/21 1526  SpO2 100 % 12/05/21 1526  Vitals shown include unvalidated device data.  Last Pain:  Vitals:   12/05/21 1017  TempSrc: Temporal  PainSc: 2          Complications: No notable events documented.

## 2021-12-05 NOTE — Anesthesia Preprocedure Evaluation (Addendum)
Anesthesia Evaluation  Patient identified by MRN, date of birth, ID band Patient awake    Reviewed: Allergy & Precautions, NPO status , Patient's Chart, lab work & pertinent test results  History of Anesthesia Complications (+) DIFFICULT AIRWAY and history of anesthetic complications (0814 while jaw hardware in place (see notes below))  Airway Mallampati: IV   Neck ROM: Full  Mouth opening: Limited Mouth Opening  Dental  (+) Missing, Chipped   Pulmonary Current Smoker (1 ppd)Patient did not abstain from smoking.,    Pulmonary exam normal breath sounds clear to auscultation       Cardiovascular Exercise Tolerance: Good negative cardio ROS Normal cardiovascular exam Rhythm:Regular Rate:Normal     Neuro/Psych Chronic back pain    GI/Hepatic negative GI ROS,   Endo/Other  negative endocrine ROS  Renal/GU negative Renal ROS     Musculoskeletal   Abdominal   Peds  Hematology  (+) REFUSES BLOOD PRODUCTS,   Anesthesia Other Findings Intubation notes 2014:   Difficult Airway- due to limited oral opening and Difficult Airway- due to anterior larynx Comments: Pt with hx broken jaw, lower hardware remains in place. Smooth IV induction by Dr Conrad Smallwood, DL x1 with Mac 3 by CRNA with Gr IV view. DL x1 with Mac 3 by MDA with Gr IV view. VL x2 with GS #4 by MDA with Gr IV view.  ETT passed by MDA, resulting in +etCO2, BS=B.  Pt with VSS throughout, SpO2 100%.   Reproductive/Obstetrics                            Anesthesia Physical Anesthesia Plan  ASA: 2  Anesthesia Plan: General   Post-op Pain Management:    Induction: Intravenous  PONV Risk Score and Plan: 1 and Ondansetron, Dexamethasone and Treatment may vary due to age or medical condition  Airway Management Planned: LMA  Additional Equipment:   Intra-op Plan:   Post-operative Plan: Extubation in OR  Informed Consent: I have reviewed the  patients History and Physical, chart, labs and discussed the procedure including the risks, benefits and alternatives for the proposed anesthesia with the patient or authorized representative who has indicated his/her understanding and acceptance.     Dental advisory given  Plan Discussed with: CRNA  Anesthesia Plan Comments: (Hx difficult airway: keep pt spontaneously breathing and ensure ability to mask ventilate before attempting to place LMA.  Glidescope in room.  Patient consented for risks of anesthesia including but not limited to:  - adverse reactions to medications - damage to eyes, teeth, lips or other oral mucosa - nerve damage due to positioning  - sore throat or hoarseness - damage to heart, brain, nerves, lungs, other parts of body or loss of life  Informed patient about role of CRNA in peri- and intra-operative care.  Patient voiced understanding.)        Anesthesia Quick Evaluation

## 2021-12-05 NOTE — Op Note (Signed)
Date of procedure: 12/05/21  Preoperative diagnosis:  Bladder cancer  Postoperative diagnosis:  Same as above, multiple overlapping sites  Procedure: TURBT, large greater than 5 cm Bilateral retrograde pyelogram  Surgeon: Hollice Espy, MD  Anesthesia: General  Complications: None  Intraoperative findings: Extensive overlapping carpeting of fine as well as more heaped up frondular tumor extending from the bladder neck anteriorly, involving the entire anterior and lateral surface of the bladder to the mid posterior bladder.  Trigone was relatively spared.  Tumor involves greater than 60% of the bladder surface and was greater than 8 cm in largest diameter.  EBL: Minimal  Specimens: Bladder tumor as well as deep base  Drains: 18 French Foley catheter  Indication: Eddie Rocha is a 56 y.o. patient with intermittent gross hematuria with CT scan findings highly suspicious for bladder malignancy.  After reviewing the management options for treatment, he elected to proceed with the above surgical procedure(s). We have discussed the potential benefits and risks of the procedure, side effects of the proposed treatment, the likelihood of the patient achieving the goals of the procedure, and any potential problems that might occur during the procedure or recuperation. Informed consent has been obtained.  Description of procedure:  The patient was taken to the operating room and general anesthesia was induced.  The patient was placed in the dorsal lithotomy position, prepped and draped in the usual sterile fashion, and preoperative antibiotics were administered. A preoperative time-out was performed.   A 26 French resectoscope was advanced per urethra into the bladder using a blunt angled obturator.  To use the laser bridge to introduce a 5 [place] open-ended ureteral catheter.  Inspection of the bladder revealed the following findings: He had extensive papillary carpeting, some of which  appear to be cohesive papillary frondular tumors, with bands of much flatter carpeting like intervening tumor that all convalesced.  This involves the entirety of the dome of the bladder, anterior bladder wall extending all the way to the bladder neck, will the lateral bladder walls and some of the posterior bladder.  The trigone and a portion of the posterior bladder wall were not involved.  Greater than 60% of the entirety of the surface of the bladder surface appeared to be covered in tumor.  At this point in time, attention was turned to the left ureteral orifice.  It was intubated using a 5 [place] open-ended ureteral catheter.  Gentle retrograde pyelogram on the side showed some distal J hooking of the ureter likely secondary to prostamegaly.  There were no filling defects or hydroureteronephrosis on this side.  Attention was then turned to the right in the same exact procedure was performed.  Again, there was some distal J hooking but otherwise no hydroureteronephrosis or filling defects.  Next, a bipolar loop was brought in using saline as the medium, started addressing the tumor in a methodical fashion.  I started on the left lateral bladder wall and worked my way across the anterior bladder surface, the posterior bladder wall and then last the right lateral bladder wall.  This was painstaking and relatively difficult as I ended up having to resect a very large surface of the bladder mucosa.  I attempted to keep the resection relatively superficial but was deeper in some areas.  There is no concern however for perforation.  Much of the tumor because it is relatively superficial was desiccated.  I did pass of a portion of the tumor as bladder tumor although it I suspect there will  be likely a large amount of cautery artifact.  These cold cup biopsy forceps that area in the posterior bladder wall to biopsy and on resected area which was passed off the field as deep base.  This was meant to be as a  representative sample to assess depth of invasion.  This was one of the larger areas of tumor.  Finally, I ended up using a rollerball to desiccate and then fulgurate the entirety of the remaining surface area of the bladder not previously resected by the bipolar loop.  I was able to achieve adequate hemostasis.  At the end of the procedure, the bladder was carefully irrigated no residual bladder chips remained.  There is no active bleeding noted.    Because of the large extent of resection as well as some deeper areas of resection, elected not to place gemcitabine as I felt that it was clinically contraindicated.  Again do the very large surface area of resection and extensive TUR bed I did also elect to leave a Foley catheter in place for a few days postoperatively.  An 24 French Foley catheter was placed and the balloon was filled with 10 cc of sterile water.  Irrigated several times the urine was relatively clear without any catheter obstruction.  Finally, he was cleaned and dried, repositioned in supine position, reversed from anesthesia, taken the back in stable condition.  Plan: We will have him return to the office on Tuesday to have his Foley catheter removed to review pathology.  Hollice Espy, M.D.

## 2021-12-05 NOTE — Discharge Instructions (Addendum)
Transurethral Resection of Bladder Tumor (TURBT) or Bladder Biopsy   Definition:  Transurethral Resection of the Bladder Tumor is a surgical procedure used to diagnose and remove tumors within the bladder. TURBT is the most common treatment for early stage bladder cancer.  General instructions:     Your recent bladder surgery requires very little post hospital care but some definite precautions.  Despite the fact that no skin incisions were used, the area around the bladder incisions are raw and covered with scabs to promote healing and prevent bleeding. Certain precautions are needed to insure that the scabs are not disturbed over the next 2-4 weeks while the healing proceeds.  Because the raw surface inside your bladder and the irritating effects of urine you may expect frequency of urination and/or urgency (a stronger desire to urinate) and perhaps even getting up at night more often. This will usually resolve or improve slowly over the healing period. You may see some blood in your urine over the first 6 weeks. Do not be alarmed, even if the urine was clear for a while. Get off your feet and drink lots of fluids until clearing occurs. If you start to pass clots or don't improve call us.  Diet:  You may return to your normal diet immediately. Because of the raw surface of your bladder, alcohol, spicy foods, foods high in acid and drinks with caffeine may cause irritation or frequency and should be used in moderation. To keep your urine flowing freely and avoid constipation, drink plenty of fluids during the day (8-10 glasses). Tip: Avoid cranberry juice because it is very acidic.  Activity:  Your physical activity doesn't need to be restricted. However, if you are very active, you may see some blood in the urine. We suggest that you reduce your activity under the circumstances until the bleeding has stopped.  Bowels:  It is important to keep your bowels regular during the postoperative  period. Straining with bowel movements can cause bleeding. A bowel movement every other day is reasonable. Use a mild laxative if needed, such as milk of magnesia 2-3 tablespoons, or 2 Dulcolax tablets. Call if you continue to have problems. If you had been taking narcotics for pain, before, during or after your surgery, you may be constipated. Take a laxative if necessary.    Medication:  You should resume your pre-surgery medications unless told not to. In addition you may be given an antibiotic to prevent or treat infection. Antibiotics are not always necessary. All medication should be taken as prescribed until the bottles are finished unless you are having an unusual reaction to one of the drugs.   [facility] [place], [place] [address] [phone]       AMBULATORY SURGERY  DISCHARGE INSTRUCTIONS   The drugs that you were given will stay in your system until tomorrow so for the next 24 hours you should not:  Drive an automobile Make any legal decisions Drink any alcoholic beverage   You may resume regular meals tomorrow.  Today it is better to start with liquids and gradually work up to solid foods.  You may eat anything you prefer, but it is better to start with liquids, then soup and crackers, and gradually work up to solid foods.   Please notify your doctor immediately if you have any unusual bleeding, trouble breathing, redness and pain at the surgery site, drainage, fever, or pain not relieved by medication.     Your post-operative visit with Dr.                                         is: Date:                        Time:    Please call to schedule your post-operative visit.  Additional Instructions: 

## 2021-12-05 NOTE — Interval H&P Note (Signed)
History and Physical Interval Note:  12/05/2021 1:03 PM  Eddie Rocha  has presented today for surgery, with the diagnosis of Bladder Tumor.  The various methods of treatment have been discussed with the patient and family. After consideration of risks, benefits and other options for treatment, the patient has consented to  Procedure(s): TRANSURETHRAL RESECTION OF BLADDER TUMOR (TURBT) (N/A) CYSTOSCOPY WITH RETROGRADE PYELOGRAM (Bilateral) BLADDER INSTILLATION OF GEMCITABINE (N/A) as a surgical intervention.  The patient's history has been reviewed, patient examined, no change in status, stable for surgery.  I have reviewed the patient's chart and labs.  Questions were answered to the patient's satisfaction.    [facility] CTAB  Hollice Espy

## 2021-12-05 NOTE — Anesthesia Procedure Notes (Signed)
Procedure Name: Intubation Date/Time: 12/05/2021 1:37 PM Performed by: Lily Peer, Sulma Ruffino, CRNA Pre-anesthesia Checklist: Patient identified, Emergency Drugs available, Suction available and Patient being monitored Patient Re-evaluated:Patient Re-evaluated prior to induction Oxygen Delivery Method: Circle system utilized Preoxygenation: Pre-oxygenation with 100% oxygen Induction Type: IV induction Ventilation: Mask ventilation without difficulty Laryngoscope Size: 4 and Glidescope Tube type: Oral Tube size: 7.5 mm Number of attempts: 1 Airway Equipment and Method: Stylet Placement Confirmation: ETT inserted through vocal cords under direct vision, positive ETCO2 and breath sounds checked- equal and bilateral Secured at: 23 cm Tube secured with: Tape Dental Injury: Teeth and Oropharynx as per pre-operative assessment

## 2021-12-06 ENCOUNTER — Encounter: Payer: Self-pay | Admitting: Urology

## 2021-12-09 ENCOUNTER — Other Ambulatory Visit: Payer: Medicaid Other

## 2021-12-09 LAB — SURGICAL PATHOLOGY

## 2021-12-10 ENCOUNTER — Ambulatory Visit (INDEPENDENT_AMBULATORY_CARE_PROVIDER_SITE_OTHER): Payer: Medicaid Other | Admitting: Urology

## 2021-12-10 ENCOUNTER — Encounter: Payer: Self-pay | Admitting: Urology

## 2021-12-10 VITALS — Ht 68.0 in

## 2021-12-10 DIAGNOSIS — R319 Hematuria, unspecified: Secondary | ICD-10-CM

## 2021-12-10 DIAGNOSIS — C679 Malignant neoplasm of bladder, unspecified: Secondary | ICD-10-CM

## 2021-12-10 MED ORDER — CEPHALEXIN 250 MG PO CAPS: 500.0000 mg | ORAL_CAPSULE | Freq: Once | ORAL | Status: DC

## 2021-12-10 NOTE — Patient Instructions (Signed)

## 2021-12-10 NOTE — Progress Notes (Signed)
12/10/21 1:31 PM   [name] 1966/06/17 [id number]  Referring provider:  No referring provider defined for this encounter. Chief Complaint  Patient presents with   Routine Post Op     HPI: SOVEREIGN RAMIRO is a 56 y.o.male with newly diagnosed bladder cancer who presents today for postop.  CT abdomen and pelvis on 11/25/2021 visualized Sessile masslike enhancement along the anterior inferior aspect of the bladder, which measures approximately 3.6 x 2.9 x 1.1 cm. The bladder mucosa was somewhat hyperenhancing diffusely and the wall of the bladder is somewhat thickened.  He has been smoking since age 69 x1 pack every 2 days.   He is s/p TURBT with bilateral retrograde pyelogram. Intraoperative findings showed extensive overlapping carpeting of fine as well as more heaped up frondular tumor extending from the bladder neck anteriorly, involving the entire anterior and lateral surface of the bladder to the mid posterior bladder.  Trigone was relatively spared.  Tumor involves greater than 60% of the bladder surface and was greater than 8 cm in largest diameter.  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 WHO/ISUP  He arrived today with foley catheter discomfort. He was known to have duct tape supporting foley catheter to penis.    He reports that he thought he would rip his catheter out when he was sleeping which is why he duct taped it.   PMH: Past Medical History:  Diagnosis Date   Chronic back pain    Hernia     Surgical History: Past Surgical History:  Procedure Laterality Date   BACK SURGERY     CYSTOSCOPY W/ RETROGRADES Bilateral 12/05/2021   Procedure: CYSTOSCOPY WITH RETROGRADE PYELOGRAM;  Surgeon: Hollice Espy, MD;  Location: [facility] ORS;  Service: Urology;  Laterality: Bilateral;   HERNIA REPAIR     LAMINECTOMY  07/06/2013   T9    T10    CORD COMPRESSION   LUMBAR LAMINECTOMY/DECOMPRESSION MICRODISCECTOMY N/A  07/06/2013   Procedure: LUMBAR LAMINECTOMY, DECOMPRESSION  T9 to T10;  Surgeon: Consuella Lose, MD;  Location: [facility];  Service: Neurosurgery;  Laterality: N/A;   MANDIBLE FRACTURE SURGERY     TRANSURETHRAL RESECTION OF BLADDER TUMOR N/A 12/05/2021   Procedure: TRANSURETHRAL RESECTION OF BLADDER TUMOR (TURBT);  Surgeon: Hollice Espy, MD;  Location: [facility] ORS;  Service: Urology;  Laterality: N/A;    Home Medications:  Allergies as of 12/10/2021   No Known Allergies      Medication List        Accurate as of December 10, 2021  1:31 PM. If you have any questions, ask your nurse or doctor.          oxybutynin 5 MG tablet Commonly known as: DITROPAN Take 1 tablet (5 mg total) by mouth every 8 (eight) hours as needed for bladder spasms.   oxyCODONE-acetaminophen 5-325 MG tablet Commonly known as: Percocet Take 1-2 tablets by mouth every 4 (four) hours as needed for moderate pain or severe pain.        Allergies:  No Known Allergies  Family History: Family History  Problem Relation Age of Onset   Cancer Brother        throat    Social History:  reports that he has been smoking cigarettes. He has a 28.00 pack-year smoking history. He has never used smokeless tobacco. He reports that he does not currently use alcohol. He reports that he does not use drugs.   Physical Exam: Ht '5\' 8"'$  (1.727 m)  BMI 20.53 kg/m   Constitutional:  Alert and oriented, No acute distress. HEENT: [place] AT, moist mucus membranes.  Trachea midline, no masses. Cardiovascular: No clubbing, cyanosis, or edema. Respiratory: Normal respiratory effort, no increased work of breathing. Skin: No rashes, bruises or suspicious lesions. Neurologic: Grossly intact, no focal deficits, moving all 4 extremities. Psychiatric: Normal mood and affect.  Foley catheter removed today.  He was given a single dose of Keflex for prophylaxis at the time of catheter removal today.  Laboratory Data:  Lab Results   Component Value Date   CREATININE 1.06 11/25/2021    Assessment & Plan:   Bladder cancer  - S/p TURBT; catheter removed today he was given a dose of Keflex '500mg'$  prophylaxis  -We discussed the surgical pathology today, very high risk for recurrence - Considering large extent of disease and high-grade component would recommend induction BCG with cystosocpy in 3 months.  - We discussed common risk of BCG including infection, bladder irritation less commonly BCG sepsis -He is agreeable with this plan  Follow-up induction BCG x6 and then cystoscopy at the 61-monthinterval  IConley Rollsas a scribe for AHollice Espy MD.,have documented all relevant documentation on the behalf of AHollice Espy MD,as directed by  AHollice Espy MD while in the presence of AHollice Espy MD.  I have reviewed the above documentation for accuracy and completeness, and I agree with the above.   AHollice Espy MD  [facility] [address] [place] [phone]

## 2021-12-23 ENCOUNTER — Ambulatory Visit (INDEPENDENT_AMBULATORY_CARE_PROVIDER_SITE_OTHER): Payer: Medicaid Other | Admitting: Physician Assistant

## 2021-12-23 ENCOUNTER — Telehealth: Payer: Self-pay

## 2021-12-23 VITALS — BP 101/67 | HR 78 | Ht 68.0 in | Wt 141.0 lb

## 2021-12-23 DIAGNOSIS — R300 Dysuria: Secondary | ICD-10-CM | POA: Diagnosis not present

## 2021-12-23 DIAGNOSIS — M545 Low back pain, unspecified: Secondary | ICD-10-CM | POA: Diagnosis not present

## 2021-12-23 DIAGNOSIS — G894 Chronic pain syndrome: Secondary | ICD-10-CM | POA: Insufficient documentation

## 2021-12-23 DIAGNOSIS — M899 Disorder of bone, unspecified: Secondary | ICD-10-CM | POA: Insufficient documentation

## 2021-12-23 DIAGNOSIS — Z789 Other specified health status: Secondary | ICD-10-CM | POA: Insufficient documentation

## 2021-12-23 DIAGNOSIS — R3915 Urgency of urination: Secondary | ICD-10-CM

## 2021-12-23 DIAGNOSIS — Z79899 Other long term (current) drug therapy: Secondary | ICD-10-CM | POA: Insufficient documentation

## 2021-12-23 LAB — BLADDER SCAN AMB NON-IMAGING

## 2021-12-23 MED ORDER — SULFAMETHOXAZOLE-TRIMETHOPRIM 800-160 MG PO TABS: 1.0000 | ORAL_TABLET | Freq: Two times a day (BID) | ORAL | 0 refills | Status: AC

## 2021-12-23 NOTE — Telephone Encounter (Signed)
Pt seen in clinic 12/23/21, gave pt BCG education in AVS and scheduled pt for 6 BCG appts.

## 2021-12-23 NOTE — Progress Notes (Unsigned)
12/23/2021 2:48 PM   Eddie Rocha 05/25/1966 [id number]  CC: Chief Complaint  Patient presents with   Dysuria   HPI: Eddie Rocha is a 56 y.o. male with newly diagnosed bladder cancer who underwent TURBT with Dr. Erlene Quan on 12/05/2021 who presents today for evaluation of dysuria.  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  Today he reports dysuria, urgency, and frequency since his surgery.  He denies fever, chills, nausea, and vomiting.  In-office UA today positive for 3+ blood, 2+ protein, and 1+ leukocyte esterase; urine microscopy with >30 WBCs/HPF, 11-30 RBCs/HPF, and moderate bacteria. PVR 53m.  As I was leaving the room today, he requested that I give him pain medication for bilateral low back pain, which he thinks is associated with his kidneys.  PMH: Past Medical History:  Diagnosis Date   Chronic back pain    Hernia     Surgical History: Past Surgical History:  Procedure Laterality Date   BACK SURGERY     CYSTOSCOPY W/ RETROGRADES Bilateral 12/05/2021   Procedure: CYSTOSCOPY WITH RETROGRADE PYELOGRAM;  Surgeon: BHollice Espy MD;  Location: [facility];  Service: Urology;  Laterality: Bilateral;   HERNIA REPAIR     LAMINECTOMY  07/06/2013   T9    T10    CORD COMPRESSION   LUMBAR LAMINECTOMY/DECOMPRESSION MICRODISCECTOMY N/A 07/06/2013   Procedure: LUMBAR LAMINECTOMY, DECOMPRESSION  T9 to T10;  Surgeon: NConsuella Lose MD;  [facility];  Service: Neurosurgery;  Laterality: N/A;   MANDIBLE FRACTURE SURGERY     TRANSURETHRAL RESECTION OF BLADDER TUMOR N/A 12/05/2021   Procedure: TRANSURETHRAL RESECTION OF BLADDER TUMOR (TURBT);  Surgeon: BHollice Espy MD;  Location: [facility];  Service: Urology;  Laterality: N/A;    Home Medications:  Allergies as of 12/23/2021   No Known Allergies      Medication List        Accurate as of December 23, 2021  2:48 PM. If you have any questions, ask your nurse or doctor.           oxybutynin 5 MG tablet Commonly known as: DITROPAN Take 1 tablet (5 mg total) by mouth every 8 (eight) hours as needed for bladder spasms.   oxyCODONE-acetaminophen 5-325 MG tablet Commonly known as: Percocet Take 1-2 tablets by mouth every 4 (four) hours as needed for moderate pain or severe pain.   sulfamethoxazole-trimethoprim 800-160 MG tablet Commonly known as: BACTRIM DS Take 1 tablet by mouth 2 (two) times daily for 7 days.        Allergies:  No Known Allergies  Family History: Family History  Problem Relation Age of Onset   Cancer Brother        throat    Social History:   reports that he has been smoking cigarettes. He has a 28.00 pack-year smoking history. He has never used smokeless tobacco. He reports that he does not currently use alcohol. He reports that he does not use drugs.  Physical Exam: BP 101/67   Pulse 78   Ht '5\' 8"'$  (1.727 m)   Wt 141 lb (64 kg)   BMI 21.44 kg/m   Constitutional:  Alert and oriented, no acute distress, nontoxic appearing HEENT: [place], AT Cardiovascular: No clubbing, cyanosis, or edema Respiratory: Normal respiratory effort, no increased work of breathing GU: No CVA tenderness Skin: No rashes, bruises or suspicious lesions Neurologic: Grossly intact, no focal deficits, moving all 4 extremities Psychiatric: Normal mood and affect  Laboratory Data: Results for orders placed or  performed in visit on 12/23/21  Microscopic Examination   Urine  Result Value Ref Range   WBC, UA >30 (A) 0 - 5 /hpf   RBC 11-30 (A) 0 - 2 /hpf   Epithelial Cells (non renal) 0-10 0 - 10 /hpf   Bacteria, UA Moderate (A) None seen/Few  Urinalysis, Complete  Result Value Ref Range   Specific Gravity, UA 1.030 1.005 - 1.030   pH, UA 5.5 5.0 - 7.5   Color, UA Yellow Yellow   Appearance Ur Cloudy (A) Clear   Leukocytes,UA 1+ (A) Negative   Protein,UA 2+ (A) Negative/Trace   Glucose, UA Negative Negative   Ketones, UA Negative Negative   RBC, UA  3+ (A) Negative   Bilirubin, UA Negative Negative   Urobilinogen, Ur 0.2 0.2 - 1.0 mg/dL   Nitrite, UA Negative Negative   Microscopic Examination See below:   Bladder Scan (Post Void Residual) in office  Result Value Ref Range   Scan Result 42m    Assessment & Plan:   1. Dysuria UA today is notable for pyuria, microscopic hematuria, and bacteriuria.  Pyuria and microscopic hematuria are likely associated with his underlying bladder malignancy and postop status, however in light of his bacteriuria, I do think it would be appropriate to start him on empiric Bactrim and send for culture for further evaluation.  We discussed that if his urine culture comes back negative, I will have him discontinue antibiotics. - Urinalysis, Complete - CULTURE, URINE COMPREHENSIVE - sulfamethoxazole-trimethoprim (BACTRIM DS) 800-160 MG tablet; Take 1 tablet by mouth 2 (two) times daily for 7 days.  Dispense: 14 tablet; Refill: 0  2. Urinary urgency Unclear if secondary to UTI as above versus bladder malignancy.  If urine culture is negative or if he remains symptomatic after completion of culture appropriate antibiotics, okay to consider OAB meds for symptom control. - Bladder Scan (Post Void Residual) in office  3. Bilateral low back pain, unspecified chronicity, unspecified whether sciatica present No CVA tenderness and his reported pain sites are inferior to where I would expect kidney pain.  I think there is an element of drug-seeking behavior here, as he previously has requested narcotics from Dr. BErlene Quan  I declined to prescribe him pain medication today and counseled him to follow-up with his PCP.  Return for Will call with results.  SDebroah Loop PA-C  [facility] [address] [place]usank[place]eraldine  [phone]

## 2021-12-23 NOTE — Telephone Encounter (Signed)
-----   Message from Shanon Ace, [place] sent at 12/10/2021 11:34 AM EDT ----- Regarding: BCG Dr. Erlene Quan wants him to get BCG in 6 weeks and cysto in 3 months   Thank you!

## 2021-12-23 NOTE — Progress Notes (Unsigned)
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 "LYMEABIGMQN", "HLAB27"   Coagulation Lab Results  Component Value Date   INR 0.90 07/06/2013   LABPROT 12.0 07/06/2013   APTT 25 07/06/2013   PLT 309 11/25/2021     Cardiovascular Lab Results  Component Value Date   CKTOTAL 374 (H) 01/31/2009   CKMB 6.3 (H) 01/31/2009   TROPONINI <0.03 11/18/2014   HGB 14.3 11/25/2021   HCT 43.9 11/25/2021     Screening No results found for: "SARSCOV2NAA", "COVIDSOURCE", "STAPHAUREUS", "MRSAPCR", "HCVAB", "HIV", "PREGTESTUR"   Cancer No results found for: "CEA", "CA125", "LABCA2"   Allergens No results found for: "ALMOND", "APPLE", "ASPARAGUS", "AVOCADO", "BANANA",  "BARLEY", "BASIL", "BAYLEAF", "GREENBEAN", "LIMABEAN", "WHITEBEAN", "BEEFIGE", "REDBEET", "BLUEBERRY", "BROCCOLI", "CABBAGE", "MELON", "CARROT", "CASEIN", "CASHEWNUT", "CAULIFLOWER", "CELERY"     Note: Lab results reviewed.  PFSH  Drug: Mr. Vinzant  reports no history of drug use. Alcohol:  reports that he does not currently use alcohol. Tobacco:  reports that he has been smoking cigarettes. He has a 28.00 pack-year smoking history. He has never used smokeless tobacco. Medical:  has a past medical history of Chronic back pain and Hernia. Family: family history includes Cancer in his brother.  Past Surgical History:  Procedure Laterality Date   BACK SURGERY     CYSTOSCOPY W/ RETROGRADES Bilateral 12/05/2021   Procedure: CYSTOSCOPY WITH RETROGRADE PYELOGRAM;  Surgeon: Hollice Espy, MD;  Location: [facility];  Service: Urology;  Laterality: Bilateral;   HERNIA REPAIR     LAMINECTOMY  07/06/2013   T9    T10    CORD COMPRESSION   LUMBAR LAMINECTOMY/DECOMPRESSION MICRODISCECTOMY N/A 07/06/2013   Procedure: LUMBAR LAMINECTOMY, DECOMPRESSION  T9 to T10;  Surgeon: Consuella Lose, MD;  Location: [facility];  Service: Neurosurgery;  Laterality: N/A;   MANDIBLE FRACTURE SURGERY     TRANSURETHRAL RESECTION OF BLADDER TUMOR N/A 12/05/2021   Procedure: TRANSURETHRAL RESECTION OF BLADDER TUMOR (TURBT);  Surgeon: Hollice Espy, MD;  Location: [facility];  Service: Urology;  Laterality: N/A;   Active Ambulatory Problems    Diagnosis Date Noted   Spinal cord compression ([place]) 07/07/2013   Thoracic spinal cord injury ([place]) 07/12/2013   Neuropathic pain 08/22/2013  Chronic back pain 08/11/2013   Mandible fracture ([place]) 04/06/2013   Spinal stenosis 08/11/2013   Lumbar spondylosis 08/09/2014   Closed bimalleolar fracture of left ankle 08/09/2014   Chronic pain syndrome 12/23/2021   Pharmacologic therapy 12/23/2021   Disorder of skeletal system 12/23/2021   Problems influencing health status  12/23/2021   Resolved Ambulatory Problems    Diagnosis Date Noted   No Resolved Ambulatory Problems   Past Medical History:  Diagnosis Date   Hernia    Constitutional Exam  General appearance: Well nourished, well developed, and well hydrated. In no apparent acute distress There were no vitals filed for this visit. BMI Assessment: Estimated body mass index is 20.53 kg/m as calculated from the following:   Height as of 12/10/21: '5\' 8"'  (1.727 m).   Weight as of 12/05/21: 135 lb (61.2 kg).  BMI interpretation table: BMI level Category Range association with higher incidence of chronic pain  <18 kg/m2 Underweight   18.5-24.9 kg/m2 Ideal body weight   25-29.9 kg/m2 Overweight Increased incidence by 20%  30-34.9 kg/m2 Obese (Class I) Increased incidence by 68%  35-39.9 kg/m2 Severe obesity (Class II) Increased incidence by 136%  >40 kg/m2 Extreme obesity (Class III) Increased incidence by 254%   Patient's current BMI Ideal Body weight  There is no height or weight on file to calculate BMI. Patient weight not recorded   BMI Readings from Last 4 Encounters:  12/10/21 20.53 kg/m  12/05/21 20.53 kg/m  12/03/21 20.51 kg/m  11/28/21 20.53 kg/m   Wt Readings from Last 4 Encounters:  12/05/21 135 lb (61.2 kg)  12/03/21 134 lb 14.7 oz (61.2 kg)  11/28/21 135 lb (61.2 kg)  11/25/21 135 lb (61.2 kg)    Psych/Mental status: Alert, oriented x 3 (person, place, & time)       Eyes: PERLA Respiratory: No evidence of acute respiratory distress  Assessment  Primary Diagnosis & Pertinent Problem List: The primary encounter diagnosis was Chronic pain syndrome. Diagnoses of Pharmacologic therapy, Disorder of skeletal system, and Problems influencing health status were also pertinent to this visit.  Visit Diagnosis (New problems to examiner): 1. Chronic pain syndrome   2. Pharmacologic therapy   3. Disorder of skeletal system   4. Problems influencing health status    Plan of Care  (Initial workup plan)  Note: Mr. Howatt was reminded that as per protocol, today's visit has been an evaluation only. We have not taken over the patient's controlled substance management.  Problem-specific plan: No problem-specific Assessment & Plan notes found for this encounter.  Lab Orders  No laboratory test(s) ordered today   Imaging Orders  No imaging studies ordered today   Referral Orders  No referral(s) requested today   Procedure Orders    No procedure(s) ordered today   Pharmacotherapy (current): Medications ordered:  No orders of the defined types were placed in this encounter.  Medications administered during this visit: Travaughn Vue. Helderman had no medications administered during this visit.   Pharmacological management options:  Opioid Analgesics: The patient was informed that there is no guarantee that he would be a candidate for opioid analgesics. The decision will be made following CDC guidelines. This decision will be based on the results of diagnostic studies, as well as Mr. Fudala risk profile.   Membrane stabilizer: To be determined at a later time  Muscle relaxant: To be determined at a later time  NSAID: To be determined at a later time  Other analgesic(s): To be determined at  a later time   Interventional management options: Mr. Cubit was informed that there is no guarantee that he would be a candidate for interventional therapies. The decision will be based on the results of diagnostic studies, as well as Mr. Liberto risk profile.  Procedure(s) under consideration:  Pending results of ordered studies      Interventional Therapies  Risk  Complexity Considerations:   Estimated body mass index is 20.53 kg/m as calculated from the following:   Height as of 12/10/21: '5\' 8"'  (1.727 m).   Weight as of 12/05/21: 135 lb (61.2 kg). WNL   Planned  Pending:   Pending further evaluation   Under consideration:   ***   Completed:   None at this  time   Therapeutic  Palliative (PRN) options:   None established      Provider-requested follow-up: No follow-ups on file.  Future Appointments  Date Time Provider [place]  12/23/2021  2:00 PM Debroah Loop, PA-C [facility] None  12/25/2021 10:00 AM Milinda Pointer, MD [facility] None    Note by: Gaspar Cola, MD Date: 12/25/2021; Time: 1:27 PM

## 2021-12-23 NOTE — Patient Instructions (Signed)

## 2021-12-24 LAB — URINALYSIS, COMPLETE
Bilirubin, UA: NEGATIVE
Glucose, UA: NEGATIVE
Ketones, UA: NEGATIVE
Nitrite, UA: NEGATIVE
Specific Gravity, UA: 1.03 (ref 1.005–1.030)
Urobilinogen, Ur: 0.2 mg/dL (ref 0.2–1.0)
pH, UA: 5.5 (ref 5.0–7.5)

## 2021-12-24 LAB — MICROSCOPIC EXAMINATION: WBC, UA: >30 /hpf — AB (ref 0–5)

## 2021-12-25 ENCOUNTER — Ambulatory Visit (HOSPITAL_BASED_OUTPATIENT_CLINIC_OR_DEPARTMENT_OTHER): Payer: Medicaid Other | Admitting: Pain Medicine

## 2021-12-25 DIAGNOSIS — Z79899 Other long term (current) drug therapy: Secondary | ICD-10-CM

## 2021-12-25 DIAGNOSIS — Z91199 Patient's noncompliance with other medical treatment and regimen due to unspecified reason: Secondary | ICD-10-CM

## 2021-12-25 DIAGNOSIS — M899 Disorder of bone, unspecified: Secondary | ICD-10-CM

## 2021-12-25 DIAGNOSIS — G894 Chronic pain syndrome: Secondary | ICD-10-CM

## 2021-12-25 DIAGNOSIS — Z789 Other specified health status: Secondary | ICD-10-CM

## 2021-12-25 DIAGNOSIS — R892 Abnormal level of other drugs, medicaments and biological substances in specimens from other organs, systems and tissues: Secondary | ICD-10-CM | POA: Insufficient documentation

## 2021-12-26 LAB — CULTURE, URINE COMPREHENSIVE

## 2021-12-30 ENCOUNTER — Telehealth: Payer: Self-pay | Admitting: *Deleted

## 2021-12-31 NOTE — Telephone Encounter (Signed)
See result note.  

## 2022-01-03 ENCOUNTER — Ambulatory Visit (INDEPENDENT_AMBULATORY_CARE_PROVIDER_SITE_OTHER): Payer: Medicaid Other | Admitting: Physician Assistant

## 2022-01-03 ENCOUNTER — Encounter: Payer: Self-pay | Admitting: Physician Assistant

## 2022-01-03 VITALS — BP 94/70 | HR 72 | Ht 68.0 in | Wt 141.0 lb

## 2022-01-03 DIAGNOSIS — R300 Dysuria: Secondary | ICD-10-CM | POA: Diagnosis not present

## 2022-01-03 LAB — MICROSCOPIC EXAMINATION: WBC, UA: >30 /hpf — AB (ref 0–5)

## 2022-01-03 LAB — URINALYSIS, COMPLETE
Bilirubin, UA: NEGATIVE
Glucose, UA: NEGATIVE
Ketones, UA: NEGATIVE
Nitrite, UA: NEGATIVE
Specific Gravity, UA: 1.025 (ref 1.005–1.030)
Urobilinogen, Ur: 0.2 mg/dL (ref 0.2–1.0)
pH, UA: 5.5 (ref 5.0–7.5)

## 2022-01-03 MED ORDER — URIBEL 118 MG PO CAPS: 1.0000 | ORAL_CAPSULE | Freq: Four times a day (QID) | ORAL | 3 refills | Status: AC | PRN

## 2022-01-03 MED ORDER — OXYBUTYNIN CHLORIDE ER 10 MG PO TB24: 10.0000 mg | ORAL_TABLET | Freq: Every day | ORAL | 3 refills | Status: AC

## 2022-01-03 NOTE — Progress Notes (Signed)
01/03/2022 9:30 AM   Eddie Rocha [birth year] [id number]  CC: Chief Complaint  Patient presents with   Dysuria   HPI: Eddie Rocha is a 55 y.o. 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 Erlene Quan on 12/05/2021 who presents today for evaluation of possible UTI.   I saw him in clinic 11 days ago for the same.  UA was notable for pyuria, microscopic hematuria and bacteriuria.  I started him on empiric Bactrim, but his urine culture finalized with mixed urogenital flora.  Today he reports ongoing dysuria as well as urgency and frequency, however he is primarily bothered by the dysuria.  In-office UA today positive for 2+ blood, 2+ protein, and 3+ leukocyte esterase; urine microscopy with >30 WBCs/HPF, 3-10 RBCs/HPF, and moderate bacteria.   PMH: Past Medical History:  Diagnosis Date   Chronic back pain    Hernia     Surgical History: Past Surgical History:  Procedure Laterality Date   BACK SURGERY     CYSTOSCOPY W/ RETROGRADES Bilateral 12/05/2021   Procedure: CYSTOSCOPY WITH RETROGRADE PYELOGRAM;  Surgeon: Hollice Espy, MD;  Location: [facility];  Service: Urology;  Laterality: Bilateral;   HERNIA REPAIR     LAMINECTOMY  07/06/2013   T9    T10    CORD COMPRESSION   LUMBAR LAMINECTOMY/DECOMPRESSION MICRODISCECTOMY N/A 07/06/2013   Procedure: LUMBAR LAMINECTOMY, DECOMPRESSION  T9 to T10;  Surgeon: Consuella Lose, MD;  Location: [facility];  Service: Neurosurgery;  Laterality: N/A;   MANDIBLE FRACTURE SURGERY     TRANSURETHRAL RESECTION OF BLADDER TUMOR N/A 12/05/2021   Procedure: TRANSURETHRAL RESECTION OF BLADDER TUMOR (TURBT);  Surgeon: Hollice Espy, MD;  Location: [facility];  Service: Urology;  Laterality: N/A;    Home Medications:  Allergies as of 01/03/2022   No Known Allergies      Medication List        Accurate as of January 03, 2022  9:30 AM. If you have any questions, ask your  nurse or doctor.          STOP taking these medications    oxybutynin 5 MG tablet Commonly known as: DITROPAN Replaced by: oxybutynin 10 MG 24 hr tablet Stopped by: Debroah Loop, PA-C   oxyCODONE-acetaminophen 5-325 MG tablet Commonly known as: Percocet Stopped by: Debroah Loop, PA-C       TAKE these medications    oxybutynin 10 MG 24 hr tablet Commonly known as: DITROPAN-XL Take 1 tablet (10 mg total) by mouth daily. Replaces: oxybutynin 5 MG tablet Started by: Debroah Loop, PA-C   Uribel 118 MG Caps Take 1 capsule (118 mg total) by mouth 4 (four) times daily as needed. Started by: Debroah Loop, PA-C        Allergies:  No Known Allergies  Family History: Family History  Problem Relation Age of Onset   Cancer Brother        throat    Social History:   reports that he has been smoking cigarettes. He has a 28.00 pack-year smoking history. He has been exposed to tobacco smoke. He has never used smokeless tobacco. He reports that he does not currently use alcohol. He reports that he does not use drugs.  Physical Exam: BP 94/70   Pulse 72   Ht '5\' 8"'$  (1.727 m)   Wt 141 lb (64 kg)   BMI 21.44 kg/m   Constitutional:  Alert and oriented, no acute distress, nontoxic appearing HEENT: [place], AT  Cardiovascular: No clubbing, cyanosis, or edema Respiratory: Normal respiratory effort, no increased work of breathing Skin: No rashes, bruises or suspicious lesions Neurologic: Grossly intact, no focal deficits, moving all 4 extremities Psychiatric: Normal mood and affect  Laboratory Data: Results for orders placed or performed in visit on 01/03/22  Microscopic Examination   Urine  Result Value Ref Range   WBC, UA >30 (A) 0 - 5 /hpf   RBC, Urine 3-10 (A) 0 - 2 /hpf   Epithelial Cells (non renal) 0-10 0 - 10 /hpf   Bacteria, UA Moderate (A) None seen/Few  Urinalysis, Complete  Result Value Ref Range   Specific Gravity, UA 1.025  1.005 - 1.030   pH, UA 5.5 5.0 - 7.5   Color, UA Yellow Yellow   Appearance Ur Hazy (A) Clear   Leukocytes,UA 3+ (A) Negative   Protein,UA 2+ (A) Negative/Trace   Glucose, UA Negative Negative   Ketones, UA Negative Negative   RBC, UA 2+ (A) Negative   Bilirubin, UA Negative Negative   Urobilinogen, Ur 0.2 0.2 - 1.0 mg/dL   Nitrite, UA Negative Negative   Microscopic Examination See below:    Assessment & Plan:   1. Dysuria UA today is stable compared to prior.  I suspect his urine culture will also grow mixed urogenital flora.  Will defer antibiotics at this time and send for culture for further evaluation and contact him with his results early next week.  I am prescribing oxybutynin XL 10 mg daily and Uribel for management of his urgency/frequency and dysuria, respectively.  I offered to send Uribel to the least expensive pharmacy in the area and he declined this.  I have sent both to his preferred pharmacy and provided him with a GoodRx card today. - Urinalysis, Complete - CULTURE, URINE COMPREHENSIVE - oxybutynin (DITROPAN-XL) 10 MG 24 hr tablet; Take 1 tablet (10 mg total) by mouth daily.  Dispense: 30 tablet; Refill: 3 - Meth-Hyo-M Bl-Na Phos-Ph Sal (URIBEL) 118 MG CAPS; Take 1 capsule (118 mg total) by mouth 4 (four) times daily as needed.  Dispense: 30 capsule; Refill: 3  Return for Will call with results.  Debroah Loop, PA-C  [facility] [address], [place], [place] [address] [phone]

## 2022-01-03 NOTE — Patient Instructions (Signed)
Start oxybutynin XL '10mg'$  daily to help with urinary urgency and frequency. Start Uribel every 6 hours as needed to help with burning. I'll call you next week when I get your urine culture back.

## 2022-01-08 LAB — CULTURE, URINE COMPREHENSIVE

## 2022-01-21 ENCOUNTER — Ambulatory Visit (INDEPENDENT_AMBULATORY_CARE_PROVIDER_SITE_OTHER): Payer: Medicaid Other | Admitting: Physician Assistant

## 2022-01-21 DIAGNOSIS — D494 Neoplasm of unspecified behavior of bladder: Secondary | ICD-10-CM

## 2022-01-21 DIAGNOSIS — C679 Malignant neoplasm of bladder, unspecified: Secondary | ICD-10-CM

## 2022-01-21 LAB — URINALYSIS, COMPLETE
Bilirubin, UA: NEGATIVE
Glucose, UA: NEGATIVE
Ketones, UA: NEGATIVE
Nitrite, UA: NEGATIVE
Specific Gravity, UA: >1.03 — ABNORMAL HIGH (ref 1.005–1.030)
Urobilinogen, Ur: 0.2 mg/dL (ref 0.2–1.0)
pH, UA: 5.5 (ref 5.0–7.5)

## 2022-01-21 LAB — MICROSCOPIC EXAMINATION: WBC, UA: >30 /hpf — AB (ref 0–5)

## 2022-01-21 NOTE — Progress Notes (Signed)
Patient presented to clinic today for scheduled BCG induction #1 of 6.  He is refusing BCG today, stating he is "burning too bad" and that he does not desire BCG.  I asked if he is refusing BCG installations entirely and he states in the affirmative that he does not desire BCG when he is burning. He is on maximal dysuria therapy with Uribel and oxybutynin XL 10 mg daily, prescribed by me.  We discussed the risks of refusing intravesical BCG including recurrence and progression of bladder cancer. He states that he understands these risks and reaffirms that he "does not want [BCG]."  Case discussed with Dr. Erlene Quan. 3 month surveillance cystoscopy scheduled for early September 2023. BCG induction series cancelled.  Debroah Loop, PA-C 01/21/22 2:30 PM  I spent 15 minutes on the day of the encounter to include pre-visit record review, face-to-face time with the patient, and post-visit ordering of tests.

## 2022-01-24 LAB — CULTURE, URINE COMPREHENSIVE

## 2022-01-27 NOTE — Progress Notes (Unsigned)
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 visit.  Spine Outside MR Films: No results found for this or any previous visit.  Spine Outside CT Films: No results found for this or any previous visit.  CT-Guided Biopsy: No results found for this or any previous visit.  CT-Guided Needle Placement: No results found for this or any previous visit.  DG Spine outside: No results found for this or any previous visit.  IR Spine outside: No results found for this or any previous visit.  NM Spine outside: No results found for this or any previous visit.   Hip Imaging: Hip-R MR w contrast: No results found for this or any previous visit.  Hip-L MR w contrast: No results found for this or any previous visit.  Hip-R MR w/wo contrast: No results found  for this or any previous visit.  Hip-L MR w/wo contrast: No results found for this or any previous visit.  Hip-R MR wo contrast: No results found for this or any previous visit.  Hip-L MR wo contrast: No results found for this or any previous visit.  Hip-R CT w contrast: No results found for this or any previous visit.  Hip-L CT w contrast: No results found for this or any previous visit.  Hip-R CT w/wo  contrast: No results found for this or any previous visit.  Hip-L CT w/wo contrast: No results found for this or any previous visit.  Hip-R CT wo contrast: No results found for this or any previous visit.  Hip-L CT wo contrast: No results found for this or any previous visit.  Hip-R DG 2-3 views: Results for orders placed during the hospital encounter of 04/23/21  DG HIP UNILAT WITH PELVIS 2-3 VIEWS RIGHT  Narrative CLINICAL DATA:  Fall, right hip pain  EXAM: DG HIP (WITH OR WITHOUT PELVIS) 2-3V RIGHT  COMPARISON:  11/30/2020  FINDINGS: No fracture or dislocation is seen.  Avascular necrosis of the right femoral head with mild collapse, unchanged.  Mild degenerative changes of the right hip. Left hip joint space is preserved.  Visualized bony pelvis appears intact.  Degenerative changes of the lower lumbar spine.  IMPRESSION: No fracture or dislocation is seen.  Chronic avascular necrosis of the right femoral head.   Electronically Signed By: Julian Hy M.D. On: 04/23/2021 01:11  Hip-L DG 2-3 views: No results found for this or any previous visit.  Hip-R DG Arthrogram: No results found for this or any previous visit.  Hip-L DG Arthrogram: No results found for this or any previous visit.  Hip-B DG Bilateral: No results found for this or any previous visit.   Knee Imaging: Knee-R MR w contrast: No results found for this or any previous visit.  Knee-L MR w/o contrast: No results found for this or any previous visit.  Knee-R MR w/wo contrast: No  results found for this or any previous visit.  Knee-L MR w/wo contrast: No results found for this or any previous visit.  Knee-R MR wo contrast: No results found for this or any previous visit.  Knee-L MR wo contrast: No results found for this or any previous visit.  Knee-R CT w contrast: No results found for this or any previous visit.  Knee-L CT w contrast: No results found for this or any previous visit.  Knee-R CT w/wo contrast: No results found for this or any previous visit.  Knee-L CT w/wo contrast: No results found for this or any previous visit.  Knee-R CT wo contrast: No results found for this or any previous visit.  Knee-L CT wo contrast: No results found for this or any previous visit.  Knee-R DG 1-2 views: No results found for this or any previous visit.  Knee-L DG 1-2 views: No results found for this or any previous visit.  Knee-R DG 3 views: No results found for this or any previous visit.  Knee-L DG 3 views: No results found for this or any previous visit.  Knee-R DG 4 views: No results found for this or any previous visit.  Knee-L DG 4 views: Results for orders placed during the hospital encounter of 11/05/17  DG Knee Complete 4 Views Left  Narrative CLINICAL DATA:  Generalized knee pain  EXAM: LEFT KNEE - COMPLETE 4+ VIEW  COMPARISON:  08/01/2014  FINDINGS: No fracture or malalignment. Mild tricompartment degenerative changes with bony spurring and minimal narrowing. Moderate to large knee effusion. Vascular calcifications.  IMPRESSION: 1. Mild degenerative changes.  No acute osseous abnormality 2. Moderate to large knee effusion   Electronically Signed By: Donavan Foil M.D. On: 11/05/2017 23:04  Knee-R DG Arthrogram: No results found for this or any previous visit.  Knee-L DG Arthrogram: No results found for this or any previous visit.   Ankle Imaging: Ankle-R DG Complete: No results  found for this or any previous visit.  Ankle-L DG  Complete: Results for orders placed during the hospital encounter of 08/01/14  DG Ankle Complete Left  Narrative CLINICAL DATA:  Slipped on ice and fell yesterday at his aunt's house, LEFT ankle and LEFT knee pain, RIGHT shoulder pain  EXAM: LEFT ANKLE COMPLETE - 3+ VIEW  COMPARISON:  None  FINDINGS: Lateral soft tissue swelling.  Tiny nondisplaced fracture at tip of medial malleolus.  Oblique nondisplaced fracture distal LEFT fibular diaphysis.  Ankle mortise intact.  No additional fracture or dislocation.  IMPRESSION: Nondisplaced fracture at tip of medial malleolus.  Nondisplaced oblique fracture distal LEFT fibula.   Electronically Signed By: Lavonia Dana M.D. On: 08/01/2014 09:21   Foot Imaging: Foot-R DG Complete: No results found for this or any previous visit.  Foot-L DG Complete: No results found for this or any previous visit.   Elbow Imaging: Elbow-R DG Complete: No results found for this or any previous visit.  Elbow-L DG Complete: No results found for this or any previous visit.   Wrist Imaging: Wrist-R DG Complete: No results found for this or any previous visit.  Wrist-L DG Complete: Results for orders placed during the hospital encounter of 11/05/17  DG Wrist Complete Left  Narrative CLINICAL DATA:  Wrist pain  EXAM: LEFT WRIST - COMPLETE 3+ VIEW  COMPARISON:  None.  FINDINGS: No acute displaced fracture is seen. Moderate arthritis at the first [facility] joint. Moderate-to-marked arthritis at the distal radiocarpal interval. Chest borderline to slightly widened scapholunate interval up to 3.5 mm. Suspected dorsal tilt of the scaphoid/DISI configuration at the wrist on the lateral view. Dorsal soft tissue swelling. Cysts in the scaphoid bone.  IMPRESSION: 1. Diffuse soft tissue swelling without acute osseous abnormality. 2. Moderate-to-marked arthritis at the first [facility] joint and distal radiocarpal joint 3. Borderline to slight widening  of the scapholunate interval as may be seen with ligamentous abnormality with suspected DISI of the wrist.   Electronically Signed By: Donavan Foil M.D. On: 11/05/2017 23:08   Hand Imaging: Hand-R DG Complete: No results found for this or any previous visit.  Hand-L DG Complete: No results found for this or any previous visit.   Complexity Note: Imaging results reviewed. Results shared with Eddie Rocha, using Layman's terms.                         ROS  Cardiovascular: {Hx; Cardiovascular History:210120525} Pulmonary or Respiratory: {Hx; Pumonary and/or Respiratory History:210120523} Neurological: {Hx; Neurological:210120504} Psychological-Psychiatric: {Hx; Psychological-Psychiatric History:210120512} Gastrointestinal: {Hx; Gastrointestinal:210120527} Genitourinary: {Hx; Genitourinary:210120506} Hematological: {Hx; Hematological:210120510} Endocrine: {Hx; Endocrine history:210120509} Rheumatologic: {Hx; Rheumatological:210120530} Musculoskeletal: {Hx; Musculoskeletal:210120528} Work History: {Hx; Work history:210120514}  Allergies  Eddie Rocha has No Known Allergies.  Laboratory Chemistry Profile   Renal Lab Results  Component Value Date   BUN 17 11/25/2021   CREATININE 1.06 11/25/2021   LABCREA 150.30 08/09/2014   GFRAA >60 11/18/2014   GFRNONAA >60 11/25/2021   SPECGRAV >1.030 (H) 01/21/2022   PHUR 5.5 01/21/2022   PROTEINUR 1+ (A) 01/21/2022     Electrolytes Lab Results  Component Value Date   NA 139 11/25/2021   K 3.8 11/25/2021   CL 106 11/25/2021   CALCIUM 9.1 11/25/2021     Hepatic Lab Results  Component Value Date   AST 15 11/25/2021   ALT 11 11/25/2021   ALBUMIN 3.8 11/25/2021   ALKPHOS 68 11/25/2021   LIPASE 35 10/09/2021     ID No  results found for: "LYMEIGGIGMAB", "HIV", "SARSCOV2NAA", "STAPHAUREUS", "MRSAPCR", "HCVAB", "PREGTESTUR", "RMSFIGG", "QFVRPH1IGG", "QFVRPH2IGG"   Bone No results found for: "VD25OH", "VD125OH2TOT",  "GE3662HU7", "ML4650PT4", "25OHVITD1", "25OHVITD2", "25OHVITD3", "TESTOFREE", "TESTOSTERONE"   Endocrine Lab Results  Component Value Date   GLUCOSE 108 (H) 11/25/2021   GLUCOSEU Negative 01/21/2022     Neuropathy No results found for: "VITAMINB12", "FOLATE", "HGBA1C", "HIV"   CNS No results found for: "COLORCSF", "APPEARCSF", "RBCCOUNTCSF", "WBCCSF", "POLYSCSF", "LYMPHSCSF", "EOSCSF", "PROTEINCSF", "GLUCCSF", "JCVIRUS", "CSFOLI", "IGGCSF", "LABACHR", "ACETBL"   Inflammation (CRP: Acute  ESR: Chronic) No results found for: "CRP", "ESRSEDRATE", "LATICACIDVEN"   Rheumatology No results found for: "RF", "ANA", "LABURIC", "URICUR", "LYMEIGGIGMAB", "LYMEABIGMQN", "HLAB27"   Coagulation Lab Results  Component Value Date   INR 0.90 07/06/2013   LABPROT 12.0 07/06/2013   APTT 25 07/06/2013   PLT 309 11/25/2021     Cardiovascular Lab Results  Component Value Date   CKTOTAL 374 (H) 01/31/2009   CKMB 6.3 (H) 01/31/2009   TROPONINI <0.03 11/18/2014   HGB 14.3 11/25/2021   HCT 43.9 11/25/2021     Screening No results found for: "SARSCOV2NAA", "COVIDSOURCE", "STAPHAUREUS", "MRSAPCR", "HCVAB", "HIV", "PREGTESTUR"   Cancer No results found for: "CEA", "CA125", "LABCA2"   Allergens No results found for: "ALMOND", "APPLE", "ASPARAGUS", "AVOCADO", "BANANA", "BARLEY", "BASIL", "BAYLEAF", "GREENBEAN", "LIMABEAN", "WHITEBEAN", "BEEFIGE", "REDBEET", "BLUEBERRY", "BROCCOLI", "CABBAGE", "MELON", "CARROT", "CASEIN", "CASHEWNUT", "CAULIFLOWER", "CELERY"     Note: Lab results reviewed.  PFSH  Drug: Eddie Rocha  reports no history of drug use. Alcohol:  reports that he does not currently use alcohol. Tobacco:  reports that he has been smoking cigarettes. He has a 28.00 pack-year smoking history. He has been exposed to tobacco smoke. He has never used smokeless tobacco. Medical:  has a past medical history of Chronic back pain and Hernia. Family: family history includes Cancer in his  brother.  Past Surgical History:  Procedure Laterality Date   BACK SURGERY     CYSTOSCOPY W/ RETROGRADES Bilateral 12/05/2021   Procedure: CYSTOSCOPY WITH RETROGRADE PYELOGRAM;  Surgeon: Hollice Espy, MD;  Location: [facility] ORS;  Service: Urology;  Laterality: Bilateral;   HERNIA REPAIR     LAMINECTOMY  07/06/2013   T9    T10    CORD COMPRESSION   LUMBAR LAMINECTOMY/DECOMPRESSION MICRODISCECTOMY N/A 07/06/2013   Procedure: LUMBAR LAMINECTOMY, DECOMPRESSION  T9 to T10;  Surgeon: Consuella Lose, MD;  Location: [place] [facility];  Service: Neurosurgery;  Laterality: N/A;   MANDIBLE FRACTURE SURGERY     TRANSURETHRAL RESECTION OF BLADDER TUMOR N/A 12/05/2021   Procedure: TRANSURETHRAL RESECTION OF BLADDER TUMOR (TURBT);  Surgeon: Hollice Espy, MD;  Location: [facility] OR[facility];  Service: Urology;  Laterality: N/A;   Active Ambulatory Problems    Diagnosis Date Noted   Spinal cord compression ([place]) 07/07/2013   Thoracic spinal cord injury ([place]) 07/12/2013   Neuropathic pain 08/22/2013   Chronic back pain 08/11/2013   Mandible fracture ([place]) 04/06/2013   Spinal stenosis 08/11/2013   Lumbar spondylosis 08/09/2014   Closed bimalleolar fracture of left ankle 08/09/2014   Chronic pain syndrome 12/23/2021   Pharmacologic therapy 12/23/2021   Disorder of skeletal system 12/23/2021   Problems influencing health status 12/23/2021   Abnormal drug screen 12/25/2021   Resolved Ambulatory Problems    Diagnosis Date Noted   No Resolved Ambulatory Problems   Past Medical History:  Diagnosis Date   Hernia    Constitutional Exam  General appearance: Well nourished, well developed, and well hydrated. In no apparent acute distress There were no  vitals filed for this visit. BMI Assessment: Estimated body mass index is 21.44 kg/m as calculated from the following:   Height as of 01/03/22: '5\' 8"'  (1.727 m).   Weight as of 01/03/22: 141 lb (64 kg).  BMI interpretation table: BMI level Category Range association  with higher incidence of chronic pain  <18 kg/m2 Underweight   18.5-24.9 kg/m2 Ideal body weight   25-29.9 kg/m2 Overweight Increased incidence by 20%  30-34.9 kg/m2 Obese (Class I) Increased incidence by 68%  35-39.9 kg/m2 Severe obesity (Class II) Increased incidence by 136%  >40 kg/m2 Extreme obesity (Class III) Increased incidence by 254%   Patient's current BMI Ideal Body weight  There is no height or weight on file to calculate BMI. Patient weight not recorded   BMI Readings from Last 4 Encounters:  01/03/22 21.44 kg/m  12/23/21 21.44 kg/m  12/10/21 20.53 kg/m  12/05/21 20.53 kg/m   Wt Readings from Last 4 Encounters:  01/03/22 141 lb (64 kg)  12/23/21 141 lb (64 kg)  12/05/21 135 lb (61.2 kg)  12/03/21 134 lb 14.7 oz (61.2 kg)    Psych/Mental status: Alert, oriented x 3 (person, place, & time)       Eyes: PERLA Respiratory: No evidence of acute respiratory distress  Assessment  Primary Diagnosis & Pertinent Problem List: There were no encounter diagnoses.  Visit Diagnosis (New problems to examiner): No diagnosis found. Plan of Care (Initial workup plan)  Note: Eddie Rocha was reminded that as per protocol, today's visit has been an evaluation only. We have not taken over the patient's controlled substance management.  Problem-specific plan: No problem-specific Assessment & Plan notes found for this encounter.  Lab Orders  No laboratory test(s) ordered today   Imaging Orders  No imaging studies ordered today   Referral Orders  No referral(s) requested today   Procedure Orders    No procedure(s) ordered today   Pharmacotherapy (current): Medications ordered:  No orders of the defined types were placed in this encounter.  Medications administered during this visit: Eddie Rocha had no medications administered during this visit.   Pharmacological management options:  Opioid Analgesics: The patient was informed that there is no guarantee that  he would be a candidate for opioid analgesics. The decision will be made following CDC guidelines. This decision will be based on the results of diagnostic studies, as well as Eddie Rocha risk profile.   Membrane stabilizer: To be determined at a later time  Muscle relaxant: To be determined at a later time  NSAID: To be determined at a later time  Other analgesic(s): To be determined at a later time   Interventional management options: Eddie Rocha was informed that there is no guarantee that he would be a candidate for interventional therapies. The decision will be based on the results of diagnostic studies, as well as Eddie Rocha risk profile.  Procedure(s) under consideration:  Pending results of ordered studies      Interventional Therapies  Risk  Complexity Considerations:   Estimated body mass index is 21.44 kg/m as calculated from the following:   Height as of 01/03/22: '5\' 8"'  (1.727 m).   Weight as of 01/03/22: 141 lb (64 kg). WNL   Planned  Pending:   Pending further evaluation   Under consideration:   ***   Completed:   None at this time   Completed by other providers:   None at this time   Therapeutic  Palliative (PRN) options:   None established  Provider-requested follow-up: No follow-ups on file.  Future Appointments  Date Time Provider [place]  01/29/2022 11:00 AM Milinda Pointer, MD [facility] None  03/19/2022  2:00 PM Hollice Espy, MD [facility] None    Note by: Gaspar Cola, MD Date: 01/29/2022; Time: 7:51 AM

## 2022-01-28 ENCOUNTER — Ambulatory Visit: Payer: Medicaid Other | Admitting: Physician Assistant

## 2022-01-29 ENCOUNTER — Ambulatory Visit (HOSPITAL_BASED_OUTPATIENT_CLINIC_OR_DEPARTMENT_OTHER): Payer: Medicaid Other | Admitting: Pain Medicine

## 2022-01-29 DIAGNOSIS — Z91199 Patient's noncompliance with other medical treatment and regimen due to unspecified reason: Secondary | ICD-10-CM

## 2022-01-29 DIAGNOSIS — R937 Abnormal findings on diagnostic imaging of other parts of musculoskeletal system: Secondary | ICD-10-CM | POA: Insufficient documentation

## 2022-02-04 ENCOUNTER — Ambulatory Visit: Payer: Medicaid Other | Admitting: Urology

## 2022-02-11 ENCOUNTER — Ambulatory Visit: Payer: Medicaid Other | Admitting: Physician Assistant

## 2022-02-18 ENCOUNTER — Ambulatory Visit: Payer: Medicaid Other | Admitting: Physician Assistant

## 2022-02-25 ENCOUNTER — Ambulatory Visit: Payer: Medicaid Other | Admitting: Physician Assistant

## 2022-03-19 ENCOUNTER — Encounter: Payer: Self-pay | Admitting: Urology

## 2022-03-19 ENCOUNTER — Other Ambulatory Visit: Payer: Medicaid Other | Admitting: Urology

## 2022-03-19 DIAGNOSIS — R319 Hematuria, unspecified: Secondary | ICD-10-CM

## 2022-03-19 DIAGNOSIS — D494 Neoplasm of unspecified behavior of bladder: Secondary | ICD-10-CM

## 2022-03-30 ENCOUNTER — Other Ambulatory Visit: Payer: Self-pay

## 2022-03-30 ENCOUNTER — Encounter (HOSPITAL_COMMUNITY): Payer: Self-pay

## 2022-03-30 ENCOUNTER — Emergency Department (HOSPITAL_COMMUNITY)
Admission: EM | Admit: 2022-03-30 | Discharge: 2022-03-30 | Disposition: A | Discharge status: Home / Self Care | Payer: Medicaid Other | Attending: Emergency Medicine | Admitting: Emergency Medicine

## 2022-03-30 ENCOUNTER — Emergency Department (HOSPITAL_COMMUNITY): Payer: Medicaid Other

## 2022-03-30 DIAGNOSIS — M1611 Unilateral primary osteoarthritis, right hip: Secondary | ICD-10-CM | POA: Insufficient documentation

## 2022-03-30 DIAGNOSIS — M169 Osteoarthritis of hip, unspecified: Secondary | ICD-10-CM | POA: Diagnosis not present

## 2022-03-30 DIAGNOSIS — R310 Gross hematuria: Secondary | ICD-10-CM | POA: Diagnosis not present

## 2022-03-30 DIAGNOSIS — M25551 Pain in right hip: Secondary | ICD-10-CM | POA: Diagnosis not present

## 2022-03-30 LAB — URINALYSIS, ROUTINE W REFLEX MICROSCOPIC
Bacteria, UA: NONE SEEN
Bilirubin Urine: NEGATIVE
Glucose, UA: NEGATIVE mg/dL
Ketones, ur: NEGATIVE mg/dL
Leukocytes,Ua: NEGATIVE
Nitrite: NEGATIVE
Protein, ur: NEGATIVE mg/dL
Specific Gravity, Urine: 1.001 — ABNORMAL LOW (ref 1.005–1.030)
pH: 6 (ref 5.0–8.0)

## 2022-03-30 MED ORDER — MELOXICAM 15 MG PO TABS: 15.0000 mg | ORAL_TABLET | Freq: Every day | ORAL | 0 refills | Status: AC

## 2022-03-30 MED ORDER — HYDROCODONE-ACETAMINOPHEN 5-325 MG PO TABS: 1.0000 | ORAL_TABLET | ORAL | 0 refills | Status: DC | PRN

## 2022-03-30 NOTE — ED Provider Notes (Signed)
[facility] Provider Note   CSN: [id number] Arrival date & time: 03/30/22  0030     History  Chief Complaint  Patient presents with   Hematuria    Hip pain    Eddie Rocha is a 56 y.o. male.  Presents to the emergency department for evaluation of right hip pain and blood in his urine.  Patient reports that he has had problems with his right hip for years.  He was told that he needed a hip replacement.  Tonight the hip gave out causing a near fall.  Pain is worse than it usually is.  No direct injury.  Patient reports that he has "prostate cancer" and he noticed blood in his urine again tonight.       Home Medications Prior to Admission medications   Medication Sig Start Date End Date Taking? Authorizing Provider  HYDROcodone-acetaminophen (NORCO/VICODIN) 5-325 MG tablet Take 1 tablet by mouth every 4 (four) hours as needed. 03/30/22  Yes Tiajah Oyster, Gwenyth Allegra, MD  meloxicam (MOBIC) 15 MG tablet Take 1 tablet (15 mg total) by mouth daily. 03/30/22  Yes Hodges Treiber, Gwenyth Allegra, MD  Meth-Hyo-M Barnett Hatter Phos-Ph Sal (URIBEL) 118 MG CAPS Take 1 capsule (118 mg total) by mouth 4 (four) times daily as needed. 01/03/22   Vaillancourt, Aldona Bar, PA-C  oxybutynin (DITROPAN-XL) 10 MG 24 hr tablet Take 1 tablet (10 mg total) by mouth daily. 01/03/22   Debroah Loop, PA-C      Allergies    Patient has no known allergies.    Review of Systems   Review of Systems  Physical Exam Updated Vital Signs BP 135/80   Pulse 100   Temp (!) 97.5 F (36.4 C) (Oral)   Resp 18   Ht '5\' 8"'$  (1.727 m)   Wt 64 kg   SpO2 97%   BMI 21.45 kg/m  Physical Exam Vitals and nursing note reviewed.  Constitutional:      General: He is not in acute distress.    Appearance: He is well-developed.  HENT:     Head: Normocephalic and atraumatic.     Mouth/Throat:     Mouth: Mucous membranes are moist.  Eyes:     General: Vision grossly intact. Gaze aligned appropriately.      Extraocular Movements: Extraocular movements intact.     Conjunctiva/sclera: Conjunctivae normal.  Cardiovascular:     Rate and Rhythm: Normal rate and regular rhythm.     Pulses: Normal pulses.     Heart sounds: Normal heart sounds, S1 normal and S2 normal. No murmur heard.    No friction rub. No gallop.  Pulmonary:     Effort: Pulmonary effort is normal. No respiratory distress.     Breath sounds: Normal breath sounds.  Abdominal:     Palpations: Abdomen is soft.     Tenderness: There is no abdominal tenderness. There is no guarding or rebound.     Hernia: No hernia is present.  Musculoskeletal:        General: No swelling.     Cervical back: Full passive range of motion without pain, normal range of motion and neck supple. No pain with movement, spinous process tenderness or muscular tenderness. Normal range of motion.     Right hip: Tenderness present. No deformity. Decreased range of motion.     Right lower leg: No edema.     Left lower leg: No edema.  Skin:    General: Skin is warm and dry.  Capillary Refill: Capillary refill takes less than 2 seconds.     Findings: No ecchymosis, erythema, lesion or wound.  Neurological:     Mental Status: He is alert and oriented to person, place, and time.     GCS: GCS eye subscore is 4. GCS verbal subscore is 5. GCS motor subscore is 6.     Cranial Nerves: Cranial nerves 2-12 are intact.     Sensory: Sensation is intact.     Motor: Motor function is intact. No weakness or abnormal muscle tone.     Coordination: Coordination is intact.  Psychiatric:        Mood and Affect: Mood normal.        Speech: Speech normal.        Behavior: Behavior normal.     ED Results / Procedures / Treatments   Labs (all labs ordered are listed, but only abnormal results are displayed) Labs Reviewed  URINALYSIS, ROUTINE W REFLEX MICROSCOPIC - Abnormal; Notable for the following components:      Result Value   Color, Urine COLORLESS (*)    Specific  Gravity, Urine 1.001 (*)    Hgb urine dipstick MODERATE (*)    All other components within normal limits    EKG None  Radiology DG Hip Unilat W or Wo Pelvis 2-3 Views Right  Result Date: 03/30/2022 CLINICAL DATA:  Right hip pain EXAM: DG HIP (WITH OR WITHOUT PELVIS) 2-3V RIGHT COMPARISON:  04/23/2021 FINDINGS: Advanced degenerative changes in the right hip with joint space loss, spurring, subchondral cyst formation and sclerosis. Left hip maintained. SI joints symmetric and unremarkable. No acute bony abnormality. Specifically, no fracture, subluxation, or dislocation. IMPRESSION: Advanced degenerative changes in the right hip. No acute bony abnormality. Electronically Signed   By: Rolm Baptise M.D.   On: 03/30/2022 01:41    Procedures Procedures    Medications Ordered in ED Medications - No data to display  ED Course/ Medical Decision Making/ A&P                           Medical Decision Making Amount and/or Complexity of Data Reviewed Labs: ordered. Radiology: ordered.   Patient presents to the emergency department for evaluation of blood in his urine.  Patient reports that there was gross blood in his urine at home.  It sounds like he passed a small clot as well.  Here in the ED, however, urine does not show gross hematuria.  No signs of infection on urinalysis.  Patient has known urothelial bladder cancer, sees urology in [place].  Refer back to his urologist.  Patient also complaining of right hip pain.  He reports that this has been ongoing for about 3 years.  He has been told that he needs a hip replacement.  X-ray does show advanced degenerative changes, no acute pathology.  No lytic lesions.  We will treat with analgesia.        Final Clinical Impression(s) / ED Diagnoses Final diagnoses:  Gross hematuria  Osteoarthritis of right hip, unspecified osteoarthritis type    Rx / DC Orders ED Discharge Orders          Ordered    HYDROcodone-acetaminophen  (NORCO/VICODIN) 5-325 MG tablet  Every 4 hours PRN        03/30/22 0205    meloxicam (MOBIC) 15 MG tablet  Daily        03/30/22 0205  Orpah Greek, MD 03/30/22 [phone]

## 2022-03-30 NOTE — ED Triage Notes (Signed)
Pt to ED from home, dropped off by family, says he has had blood in urine, hx of bladder CA with tumor removed, also c/o right hip pain that started couple years ago and progressively gotten worse- pt denies injury

## 2022-03-30 NOTE — ED Notes (Signed)
ED Provider at bedside. Pt provided urine sample- clear with light yellow tint-Dr Pollina aware

## 2022-03-30 NOTE — Discharge Instructions (Signed)
Please follow-up with your urologist to have your urine rechecked.  Schedule follow-up with your primary care doctor to have your hip rechecked.

## 2022-03-30 NOTE — ED Notes (Signed)
Pt ambulated self to bathroom with out difficulty

## 2022-04-01 MED FILL — Hydrocodone-Acetaminophen Tab 5-325 MG: ORAL | Qty: 6 | Status: AC

## 2022-04-16 ENCOUNTER — Telehealth: Payer: Self-pay

## 2022-04-16 NOTE — Telephone Encounter (Signed)
..   Medicaid Managed Care   Unsuccessful Outreach Note  04/16/2022 Name: Eddie Rocha MRN: [id number] DOB: [birth year]  Referred by: Pcp, No Reason for referral : High Risk Managed Medicaid (I called the patient today to offer the services of the Managed Medicaid Team. I left my name and number and a detailed message on his VM.)   An unsuccessful telephone outreach was attempted today. The patient was referred to the case management team for assistance with care management and care coordination.   Follow Up Plan: The care management team will reach out to the patient again over the next 5 days.    [place]

## 2022-04-21 ENCOUNTER — Telehealth: Payer: Self-pay

## 2022-04-21 NOTE — Telephone Encounter (Signed)
..   Medicaid Managed Care   Unsuccessful Outreach Note  04/21/2022 Name: Eddie Rocha MRN: [id number] DOB: [birth year]  Referred by: Pcp, No Reason for referral : High Risk Managed Medicaid (I called the patient today to get him scheduled with the MM Team. I left my name and number on his VM.)   A second unsuccessful telephone outreach was attempted today. The patient was referred to the case management team for assistance with care management and care coordination.   Follow Up Plan: The care management team will reach out to the patient again over the next 7 days.      [place]

## 2022-04-23 ENCOUNTER — Telehealth: Payer: Self-pay

## 2022-04-23 ENCOUNTER — Other Ambulatory Visit: Payer: Self-pay

## 2022-04-23 ENCOUNTER — Emergency Department
Admission: EM | Admit: 2022-04-23 | Discharge: 2022-04-23 | Disposition: A | Discharge status: Home / Self Care | Payer: Medicaid Other | Attending: Emergency Medicine | Admitting: Emergency Medicine

## 2022-04-23 ENCOUNTER — Emergency Department: Payer: Medicaid Other

## 2022-04-23 DIAGNOSIS — G8929 Other chronic pain: Secondary | ICD-10-CM

## 2022-04-23 DIAGNOSIS — Z8546 Personal history of malignant neoplasm of prostate: Secondary | ICD-10-CM | POA: Insufficient documentation

## 2022-04-23 DIAGNOSIS — M25551 Pain in right hip: Secondary | ICD-10-CM | POA: Insufficient documentation

## 2022-04-23 DIAGNOSIS — M1611 Unilateral primary osteoarthritis, right hip: Secondary | ICD-10-CM | POA: Diagnosis not present

## 2022-04-23 DIAGNOSIS — M167 Other unilateral secondary osteoarthritis of hip: Secondary | ICD-10-CM | POA: Insufficient documentation

## 2022-04-23 MED ORDER — HYDROCODONE-ACETAMINOPHEN 5-325 MG PO TABS: 1.0000 | ORAL_TABLET | Freq: Four times a day (QID) | ORAL | 0 refills | Status: DC | PRN

## 2022-04-23 MED ORDER — HYDROCODONE-ACETAMINOPHEN 5-325 MG PO TABS: 1.0000 | ORAL_TABLET | Freq: Four times a day (QID) | ORAL | 0 refills | Status: AC | PRN

## 2022-04-23 NOTE — ED Triage Notes (Signed)
Pt comes with c/o right hip pain. Pt states no fall. Pt states he is needing hip replacement.   Pt states prostate cancer and can't have surgery until it is cleared up.

## 2022-04-23 NOTE — Discharge Instructions (Addendum)
You will need to follow-up with your primary care provider if any continued pain medication is needed.  The emergency department can only give limited amounts of pain medication.  See an orthopedist as soon as you have been cleared from your prostate problems.

## 2022-04-23 NOTE — ED Provider Notes (Signed)
[facility] Provider Note    Event Date/Time   First MD Initiated Contact with Patient 04/23/22 1350     (approximate)   History   Hip Pain   HPI  Eddie Rocha is a 56 y.o. male   to the ED with complaint of right hip pain for "years".  Patient denies any recent injury.  He states that he knows that he needs a hip replacement however he is dealing currently with prostate cancer and cannot have surgery until he is given the go ahead by his oncologist.  Patient has history of chronic pain, spinal stenosis, prostate cancer.      Physical Exam   Triage Vital Signs: ED Triage Vitals  Enc Vitals Group     BP 04/23/22 1233 (!) 95/59     Pulse Rate 04/23/22 1233 87     Resp 04/23/22 1233 18     Temp 04/23/22 1233 97.8 F (36.6 C)     Temp Source 04/23/22 1233 Oral     SpO2 04/23/22 1233 100 %     Weight --      Height --      Head Circumference --      Peak Flow --      Pain Score 04/23/22 1231 10     Pain Loc --      Pain Edu? --      Excl. in [place]? --     Most recent vital signs: Vitals:   04/23/22 1233  BP: (!) 95/59  Pulse: 87  Resp: 18  Temp: 97.8 F (36.6 C)  SpO2: 100%     General: Awake, no distress.  Patient is ambulatory with the use of a cane. CV:  Good peripheral perfusion.  Resp:  Normal effort.  Lungs are clear bilaterally. Abd:  No distention.  Other:  Right hip without deformity, shortening or rotation.   ED Results / Procedures / Treatments   Labs (all labs ordered are listed, but only abnormal results are displayed) Labs Reviewed - No data to display    RADIOLOGY Right hip strain images were reviewed by myself and interpreted as severe degenerative changes/osteoarthritis.    PROCEDURES:  Critical Care performed:   Procedures   MEDICATIONS ORDERED IN ED: Medications - No data to display   IMPRESSION / MDM / [place] / ED COURSE  I reviewed the triage vital signs and the nursing  notes.   Differential diagnosis includes, but is not limited to, osteoarthritis, hip fracture, chronic pain.  56 year old male presents to the ED with complaint of chronic right hip pain without history of recent injury.  Patient states he is aware that he needs a hip replacement however this is on hold due to his prostate cancer.  X-rays were reassuring and patient was made aware that he does not have a fracture.  A prescription for hydrocodone was sent to the pharmacy after checking to see if he had any recent prescriptions.  Patient is aware that he needs to follow-up with his PCP for any additional pain medication and he will continue using his cane for added support and protection.      Patient's presentation is most consistent with acute complicated illness / injury requiring diagnostic workup.  FINAL CLINICAL IMPRESSION(S) / ED DIAGNOSES   Final diagnoses:  Chronic right hip pain  Other secondary osteoarthritis of right hip     Rx / DC Orders   ED Discharge Orders  Ordered    HYDROcodone-acetaminophen (NORCO/VICODIN) 5-325 MG tablet  Every 6 hours PRN,   Status:  Discontinued        04/23/22 1357    HYDROcodone-acetaminophen (NORCO/VICODIN) 5-325 MG tablet  Every 6 hours PRN        04/23/22 1359             Note:  This document was prepared using Dragon voice recognition software and may include unintentional dictation errors.   Johnn Hai, PA-C 04/23/22 1430    Lavonia Drafts, MD 04/23/22 [id number]

## 2022-04-23 NOTE — Telephone Encounter (Signed)
..   Medicaid Managed Care   Unsuccessful Outreach Note  04/23/2022 Name: Eddie Rocha MRN: [id number] DOB: 04-04-1966  Referred by: Pcp, No Reason for referral : High Risk Managed Medicaid (I called the patient today to get him scheduled with the MM Team. Patient did not answer. I left my name and number on his VM. This was the 3rd attempt.)   Third unsuccessful telephone outreach was attempted today. The patient was referred to the case management team for assistance with care management and care coordination. The patient's primary care provider has been notified of our unsuccessful attempts to make or maintain contact with the patient. The care management team is pleased to engage with this patient at any time in the future should he/she be interested in assistance from the care management team.   Follow Up Plan: We have been unable to make contact with the patient for follow up. The care management team is available to follow up with the patient after provider conversation with the patient regarding recommendation for care management engagement and subsequent re-referral to the care management team.   [name], [place]

## 2022-08-13 ENCOUNTER — Telehealth: Payer: Self-pay

## 2022-08-13 NOTE — Telephone Encounter (Signed)
..   Medicaid Managed Care   Unsuccessful Outreach Note  08/13/2022 Name: Eddie Rocha MRN: [id number] DOB: Apr 13, 1966  Referred by: Pcp, No Reason for referral : Appointment   An unsuccessful telephone outreach was attempted today. The patient was referred to the case management team for assistance with care management and care coordination.   Follow Up Plan: A HIPAA compliant phone message was left for the patient providing contact information and requesting a return call.  The care management team will reach out to the patient again over the next 5 days.   [name]

## 2022-08-15 ENCOUNTER — Telehealth: Payer: Self-pay

## 2022-08-15 NOTE — Telephone Encounter (Signed)
..   Medicaid Managed Care   Unsuccessful Outreach Note  08/15/2022 Name: Eddie Rocha MRN: [id number] DOB: December 23, 1965  Referred by: Pcp, No Reason for referral : Appointment   A second unsuccessful telephone outreach was attempted today. The patient was referred to the case management team for assistance with care management and care coordination.   Follow Up Plan: A HIPAA compliant phone message was left for the patient providing contact information and requesting a return call.  The care management team will reach out to the patient again over the next 7 days.   [place]

## 2024-07-11 ENCOUNTER — Encounter (HOSPITAL_COMMUNITY): Payer: Self-pay

## 2024-07-11 ENCOUNTER — Emergency Department (HOSPITAL_COMMUNITY)
Admission: EM | Admit: 2024-07-11 | Discharge: 2024-07-11 | Disposition: A | Discharge status: Home / Self Care | Attending: Emergency Medicine | Admitting: Emergency Medicine

## 2024-07-11 DIAGNOSIS — Z8551 Personal history of malignant neoplasm of bladder: Secondary | ICD-10-CM | POA: Insufficient documentation

## 2024-07-11 DIAGNOSIS — Z436 Encounter for attention to other artificial openings of urinary tract: Secondary | ICD-10-CM | POA: Diagnosis present

## 2024-07-11 NOTE — ED Triage Notes (Signed)
 Pt presents wanting bilateral nephrostomy bags changed.  Pt reports he last had them changed x3 months ago.  Pt reports he is out of supplies.    Pt would like a referral for a local specialist.

## 2024-07-11 NOTE — ED Provider Notes (Signed)
 " Eddie Rocha EMERGENCY DEPARTMENT AT [facility] Provider Note   CSN: [id number] Arrival date & time: 07/11/24  1021     Patient presents with: Nephrostomy Bag Change   Eddie Rocha is a 59 y.o. male.   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  Reports that his Medicaid expired last year and he was therefore unable to get supplies for some time.  However now that it is the new year he has Medicaid again and should be able to reestablish care, however he needs new bags to hold him over until he can get to his next appointment. Denies any pain, fevers, or chills. Both lines are flowing appropriately. Denies any other complaints.   The history is provided by the patient. No language interpreter was used.       Prior to Admission medications  Medication Sig Start Date End Date Taking? Authorizing Provider  HYDROcodone -acetaminophen  (NORCO/VICODIN) 5-325 MG tablet Take 1 tablet by mouth every 6 (six) hours as needed. 04/23/22   Saunders Shona CROME, PA-C  meloxicam  (MOBIC ) 15 MG tablet Take 1 tablet (15 mg total) by mouth daily. 03/30/22   Haze Lonni PARAS, MD  Meth-Hyo-M Starlene Phos-Ph Sal (URIBEL ) 118 MG CAPS Take 1 capsule (118 mg total) by mouth 4 (four) times daily as needed. 01/03/22   Vaillancourt, Samantha, PA-C  oxybutynin  (DITROPAN -XL) 10 MG 24 hr tablet Take 1 tablet (10 mg total) by mouth daily. 01/03/22   Vaillancourt, Samantha, PA-C    Allergies: Patient has no known allergies.    Review of Systems  All other systems reviewed and are negative.   Updated Vital Signs BP 116/77 (BP Location: Right Arm)   Pulse (!) 102   Temp 98.1 F (36.7 C)   Resp 18   Ht 5' 8 (1.727 m)   Wt 64 kg   SpO2 100%   BMI 21.44 kg/m   Physical Exam Vitals and nursing note reviewed.  Constitutional:      General: He is not in acute distress.    Appearance: Normal  appearance. He is normal weight. He is not ill-appearing, toxic-appearing or diaphoretic.  HENT:     Head: Normocephalic and atraumatic.  Cardiovascular:     Rate and Rhythm: Normal rate.  Pulmonary:     Effort: Pulmonary effort is normal. No respiratory distress.  Abdominal:     General: Abdomen is flat.     Palpations: Abdomen is soft.     Tenderness: There is no abdominal tenderness.     Comments: Bilateral nephrostomy tubes in place in bilateral flanks. No CVA tenderness. Both with clear appearing urine in the bags.   Musculoskeletal:        General: Normal range of motion.     Cervical back: Normal range of motion.  Skin:    General: Skin is warm and dry.  Neurological:     General: No focal deficit present.     Mental Status: He is alert.  Psychiatric:        Mood and Affect: Mood normal.        Behavior: Behavior normal.     (all labs ordered are listed, but only abnormal results are displayed) Labs Reviewed - No data to display  EKG: None  Radiology: No results found.   Procedures   Medications Ordered in the ED - No data to display  Medical Decision Making  Patient presents today requesting nephrostomy tube supplies.  He is afebrile, nontoxic-appearing, and in no acute distress with reassuring vital signs.  Physical exam reveals bilateral nephrostomy tubes in place in bilateral flanks. No CVA tenderness. Both with clear appearing urine in the bags.  Patient provided with replacement bags, emphasized importance of close outpatient follow-up with return precautions. Evaluation and diagnostic testing in the emergency department does not suggest an emergent condition requiring admission or immediate intervention beyond what has been performed at this time.  Plan for discharge with close PCP follow-up.  Patient is understanding and amenable with plan, educated on red flag symptoms that would prompt immediate return.  Patient discharged  in stable condition.  Informed by nursing staff that patient left prior to receiving paperwork.  Placement bags were at bedside, it appears patient took them with him.  Nursing staff did not change the bags.  Final diagnoses:  Attention to nephrostomy [facility])    ED Discharge Orders     None          Nora Lauraine DELENA DEVONNA 07/11/24 1346    Suzette Pac, MD 07/12/24 1709  "
